# Patient Record
Sex: Female | Born: 1985 | Race: Black or African American | Hispanic: No | Marital: Married | Smoking: Current every day smoker
Health system: Southern US, Community
[De-identification: ages and names within clinical notes are randomized; demographics above are authoritative.]

## PROBLEM LIST (undated history)

## (undated) ENCOUNTER — Inpatient Hospital Stay (HOSPITAL_COMMUNITY): Payer: Self-pay

## (undated) ENCOUNTER — Emergency Department (HOSPITAL_COMMUNITY): Discharge status: Absent without leave | Payer: BC Managed Care – PPO

## (undated) DIAGNOSIS — Z8619 Personal history of other infectious and parasitic diseases: Secondary | ICD-10-CM

## (undated) DIAGNOSIS — K219 Gastro-esophageal reflux disease without esophagitis: Secondary | ICD-10-CM

## (undated) DIAGNOSIS — Z87898 Personal history of other specified conditions: Secondary | ICD-10-CM

## (undated) DIAGNOSIS — R51 Headache: Secondary | ICD-10-CM

## (undated) DIAGNOSIS — J9801 Acute bronchospasm: Secondary | ICD-10-CM

## (undated) DIAGNOSIS — R002 Palpitations: Secondary | ICD-10-CM

## (undated) DIAGNOSIS — F439 Reaction to severe stress, unspecified: Secondary | ICD-10-CM

## (undated) DIAGNOSIS — B009 Herpesviral infection, unspecified: Secondary | ICD-10-CM

## (undated) DIAGNOSIS — M6208 Separation of muscle (nontraumatic), other site: Secondary | ICD-10-CM

## (undated) DIAGNOSIS — O479 False labor, unspecified: Secondary | ICD-10-CM

## (undated) DIAGNOSIS — Z9289 Personal history of other medical treatment: Secondary | ICD-10-CM

## (undated) DIAGNOSIS — IMO0002 Reserved for concepts with insufficient information to code with codable children: Secondary | ICD-10-CM

## (undated) DIAGNOSIS — O165 Unspecified maternal hypertension, complicating the puerperium: Secondary | ICD-10-CM

## (undated) DIAGNOSIS — G5601 Carpal tunnel syndrome, right upper limb: Secondary | ICD-10-CM

## (undated) DIAGNOSIS — I10 Essential (primary) hypertension: Secondary | ICD-10-CM

## (undated) DIAGNOSIS — N83201 Unspecified ovarian cyst, right side: Secondary | ICD-10-CM

## (undated) DIAGNOSIS — I519 Heart disease, unspecified: Secondary | ICD-10-CM

## (undated) DIAGNOSIS — F410 Panic disorder [episodic paroxysmal anxiety] without agoraphobia: Secondary | ICD-10-CM

## (undated) DIAGNOSIS — O26851 Spotting complicating pregnancy, first trimester: Secondary | ICD-10-CM

## (undated) DIAGNOSIS — Z87442 Personal history of urinary calculi: Secondary | ICD-10-CM

## (undated) DIAGNOSIS — R102 Pelvic and perineal pain: Secondary | ICD-10-CM

## (undated) DIAGNOSIS — O1490 Unspecified pre-eclampsia, unspecified trimester: Secondary | ICD-10-CM

## (undated) DIAGNOSIS — D649 Anemia, unspecified: Secondary | ICD-10-CM

## (undated) DIAGNOSIS — Z22330 Carrier of Group B streptococcus: Secondary | ICD-10-CM

## (undated) DIAGNOSIS — E785 Hyperlipidemia, unspecified: Secondary | ICD-10-CM

## (undated) HISTORY — DX: Unspecified pre-eclampsia, unspecified trimester: O14.90

## (undated) HISTORY — DX: Reserved for concepts with insufficient information to code with codable children: IMO0002

## (undated) HISTORY — DX: Palpitations: R00.2

## (undated) HISTORY — DX: Spotting complicating pregnancy, first trimester: O26.851

## (undated) HISTORY — PX: OTHER SURGICAL HISTORY: SHX169

## (undated) HISTORY — DX: Reaction to severe stress, unspecified: F43.9

## (undated) HISTORY — DX: Personal history of other specified conditions: Z87.898

## (undated) HISTORY — DX: Headache: R51

## (undated) HISTORY — DX: Unspecified maternal hypertension, complicating the puerperium: O16.5

## (undated) HISTORY — DX: Hyperlipidemia, unspecified: E78.5

## (undated) HISTORY — DX: Heart disease, unspecified: I51.9

## (undated) HISTORY — DX: Herpesviral infection, unspecified: B00.9

## (undated) HISTORY — DX: Gastro-esophageal reflux disease without esophagitis: K21.9

## (undated) HISTORY — DX: False labor, unspecified: O47.9

## (undated) HISTORY — DX: Unspecified ovarian cyst, right side: N83.201

## (undated) HISTORY — DX: Carrier of group B Streptococcus: Z22.330

## (undated) HISTORY — DX: Anemia, unspecified: D64.9

## (undated) HISTORY — DX: Carpal tunnel syndrome, right upper limb: G56.01

## (undated) HISTORY — DX: Separation of muscle (nontraumatic), other site: M62.08

## (undated) HISTORY — DX: Pelvic and perineal pain: R10.2

## (undated) HISTORY — DX: Essential (primary) hypertension: I10

## (undated) HISTORY — DX: Personal history of other infectious and parasitic diseases: Z86.19

## (undated) HISTORY — PX: WISDOM TOOTH EXTRACTION: SHX21

## (undated) HISTORY — DX: Personal history of other medical treatment: Z92.89

---
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
## 2003-06-16 HISTORY — PX: OTHER SURGICAL HISTORY: SHX169

## 2003-08-14 DIAGNOSIS — R87619 Unspecified abnormal cytological findings in specimens from cervix uteri: Secondary | ICD-10-CM

## 2003-08-14 DIAGNOSIS — IMO0002 Reserved for concepts with insufficient information to code with codable children: Secondary | ICD-10-CM

## 2003-08-14 HISTORY — DX: Unspecified abnormal cytological findings in specimens from cervix uteri: R87.619

## 2003-08-14 HISTORY — DX: Reserved for concepts with insufficient information to code with codable children: IMO0002

## 2005-06-15 DIAGNOSIS — O4700 False labor before 37 completed weeks of gestation, unspecified trimester: Secondary | ICD-10-CM

## 2005-06-15 HISTORY — DX: False labor before 37 completed weeks of gestation, unspecified trimester: O47.00

## 2005-12-02 ENCOUNTER — Inpatient Hospital Stay (HOSPITAL_COMMUNITY): Admission: AD | Admit: 2005-12-02 | Discharge: 2005-12-03 | Payer: Self-pay | Admitting: Obstetrics and Gynecology

## 2005-12-03 ENCOUNTER — Inpatient Hospital Stay (HOSPITAL_COMMUNITY): Admission: AD | Admit: 2005-12-03 | Discharge: 2005-12-03 | Payer: Self-pay | Admitting: Obstetrics and Gynecology

## 2006-01-06 ENCOUNTER — Inpatient Hospital Stay (HOSPITAL_COMMUNITY): Admission: RE | Admit: 2006-01-06 | Discharge: 2006-01-08 | Payer: Self-pay | Admitting: Obstetrics and Gynecology

## 2006-08-11 ENCOUNTER — Emergency Department (HOSPITAL_COMMUNITY): Admission: EM | Admit: 2006-08-11 | Discharge: 2006-08-12 | Payer: Self-pay | Admitting: Emergency Medicine

## 2006-09-21 ENCOUNTER — Emergency Department (HOSPITAL_COMMUNITY): Admission: EM | Admit: 2006-09-21 | Discharge: 2006-09-22 | Payer: Self-pay | Admitting: Emergency Medicine

## 2007-01-21 ENCOUNTER — Emergency Department (HOSPITAL_COMMUNITY): Admission: EM | Admit: 2007-01-21 | Discharge: 2007-01-21 | Payer: Self-pay | Admitting: Emergency Medicine

## 2007-03-09 ENCOUNTER — Other Ambulatory Visit: Admission: RE | Admit: 2007-03-09 | Discharge: 2007-03-09 | Payer: Self-pay | Admitting: Gynecology

## 2007-06-16 HISTORY — PX: DILATION AND CURETTAGE OF UTERUS: SHX78

## 2007-09-22 ENCOUNTER — Inpatient Hospital Stay (HOSPITAL_COMMUNITY): Admission: AD | Admit: 2007-09-22 | Discharge: 2007-09-22 | Payer: Self-pay | Admitting: Obstetrics and Gynecology

## 2007-10-27 ENCOUNTER — Inpatient Hospital Stay (HOSPITAL_COMMUNITY): Admission: AD | Admit: 2007-10-27 | Discharge: 2007-10-27 | Payer: Self-pay | Admitting: Obstetrics and Gynecology

## 2007-10-30 ENCOUNTER — Ambulatory Visit (HOSPITAL_COMMUNITY): Admission: AD | Admit: 2007-10-30 | Discharge: 2007-10-31 | Payer: Self-pay | Admitting: Obstetrics and Gynecology

## 2007-10-31 ENCOUNTER — Encounter (INDEPENDENT_AMBULATORY_CARE_PROVIDER_SITE_OTHER): Payer: Self-pay | Admitting: Obstetrics and Gynecology

## 2008-08-21 IMAGING — US US OB TRANSVAGINAL
1 series · 14 of 23 positions shown · non-contrast
Comparison: None

CLINICAL DATA: Pregnancy; ;

OBSTETRIC <14 WK US  TRANSVAGINAL
TECHNIQUE: Transvaginal ultrasound examination were performed for
complete evaluation of the gestation as well as the maternal
uterus, adnexal regions, and pelvic cul-de-sac.

[Series 1: us ob transvaginal · 0.16mm/px · 14 of 23 slices shown]
[im 1/23]
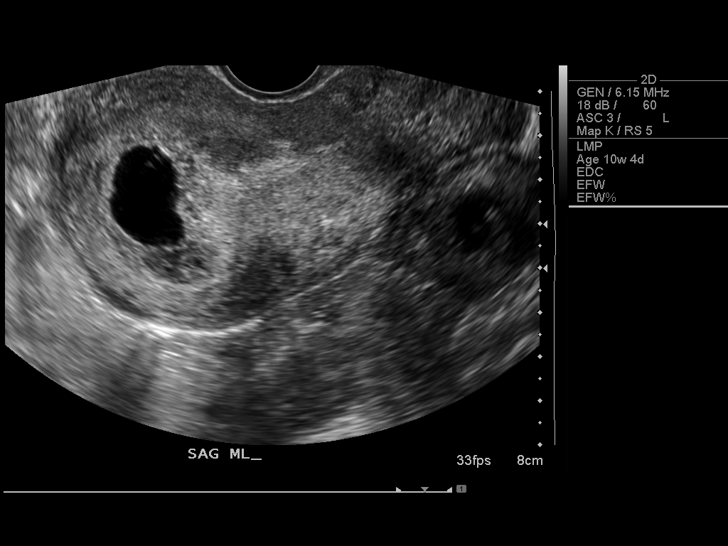
[im 3/23]
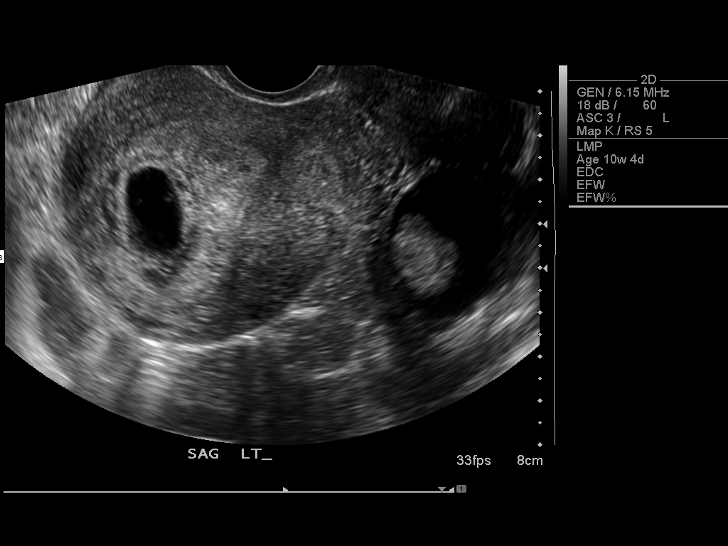
[im 5/23]
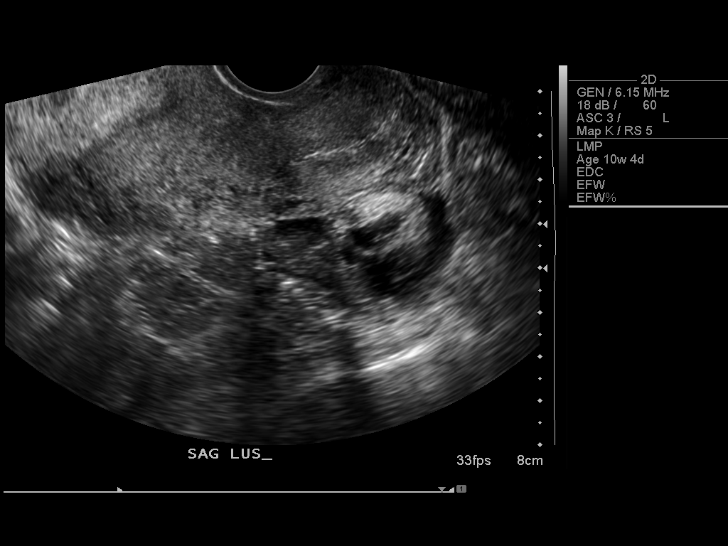
[im 6/23]
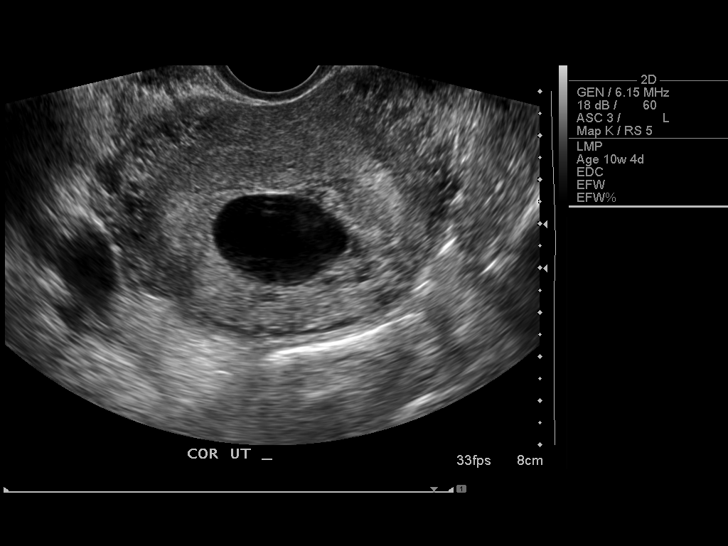
[im 8/23]
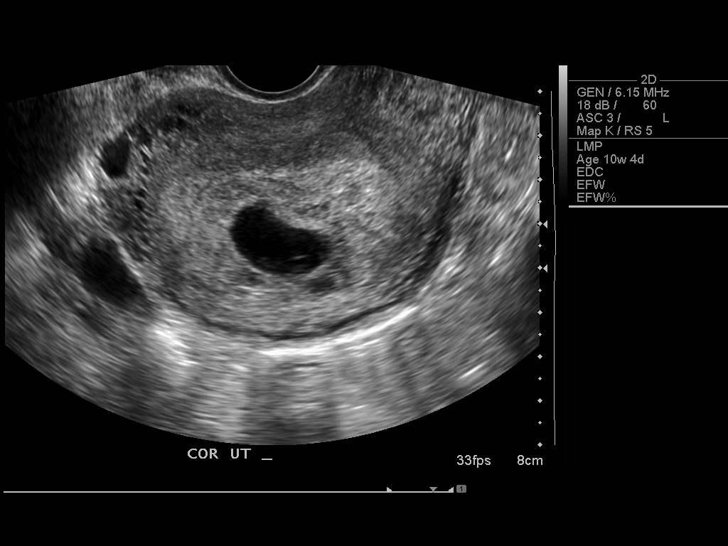
[im 10/23]
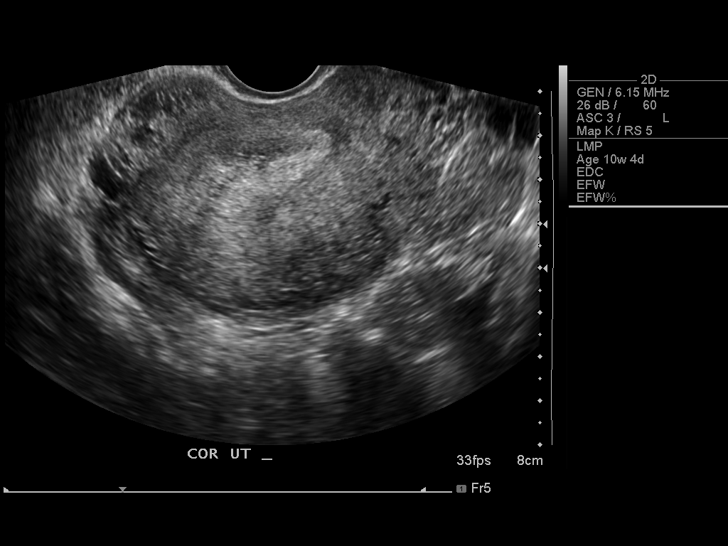
[im 11/23]
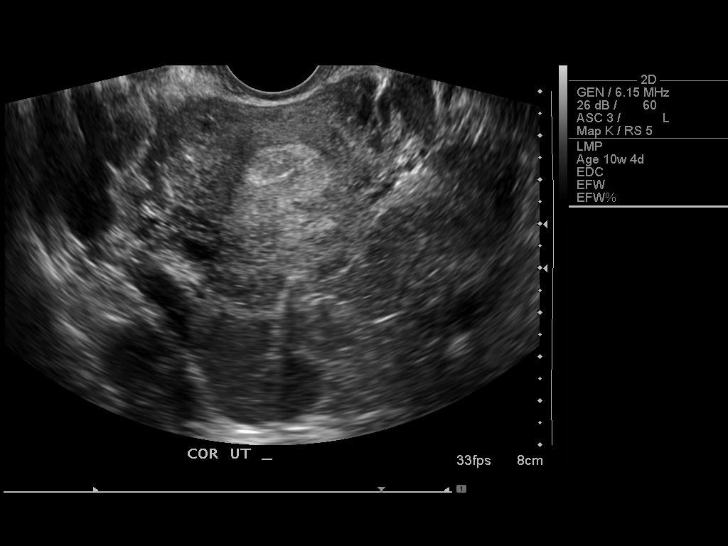
[im 13/23]
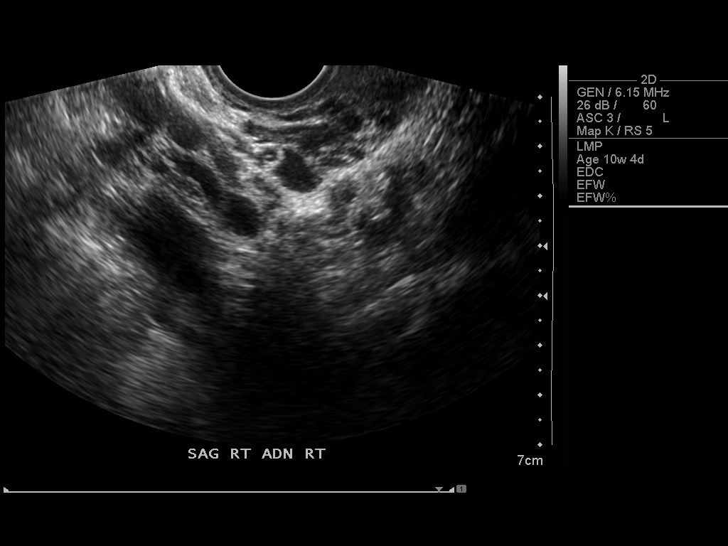
[im 14/23]
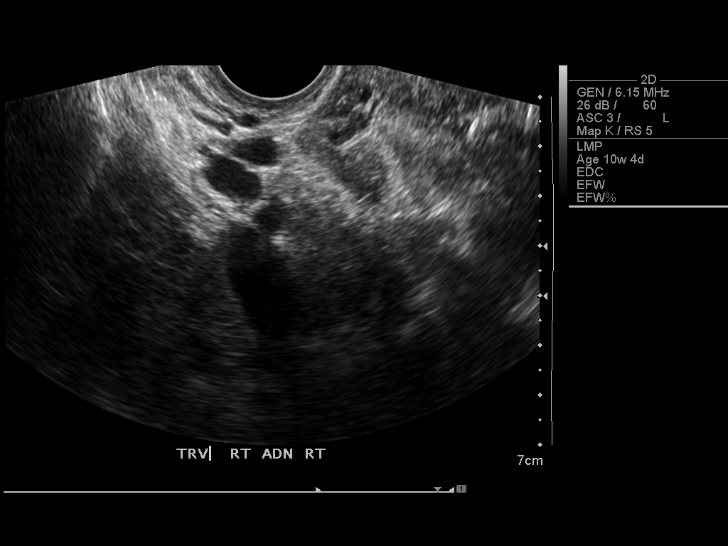
[im 16/23]
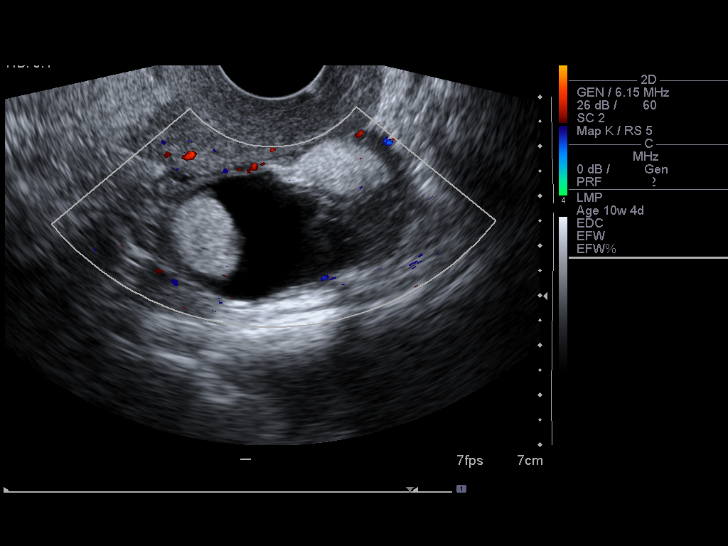
[im 18/23]
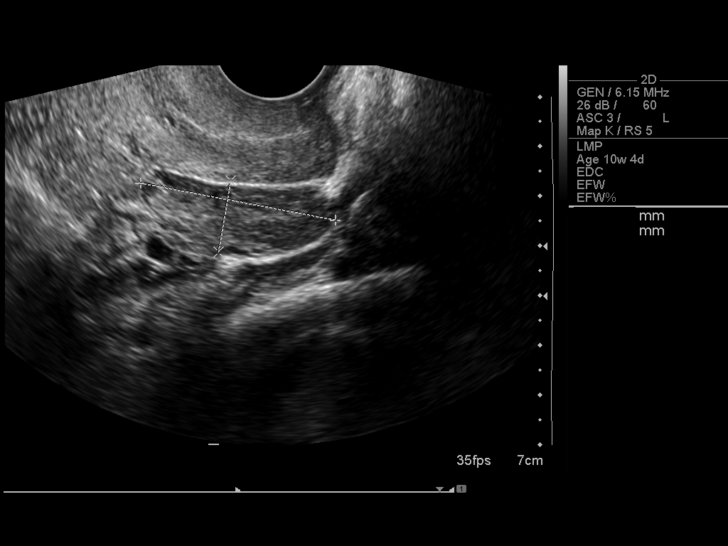
[im 19/23]
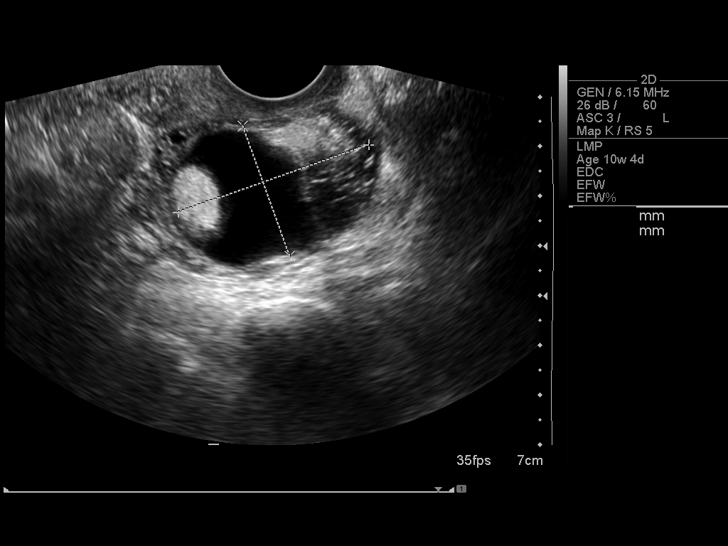
[im 21/23]
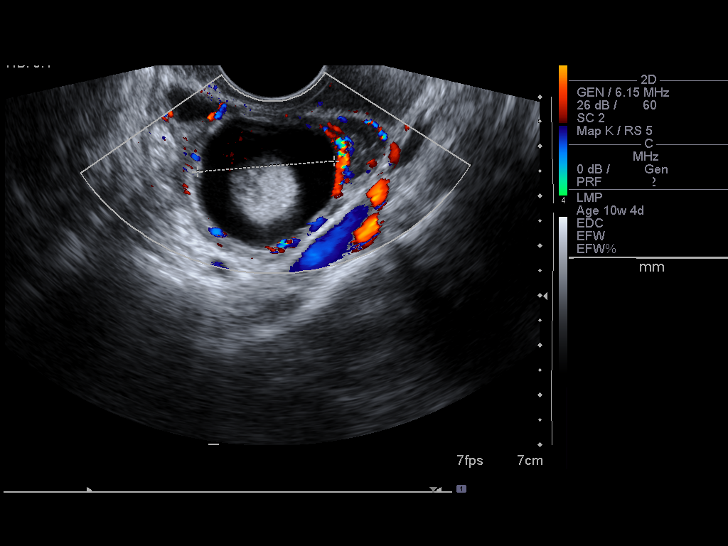
[im 23/23]
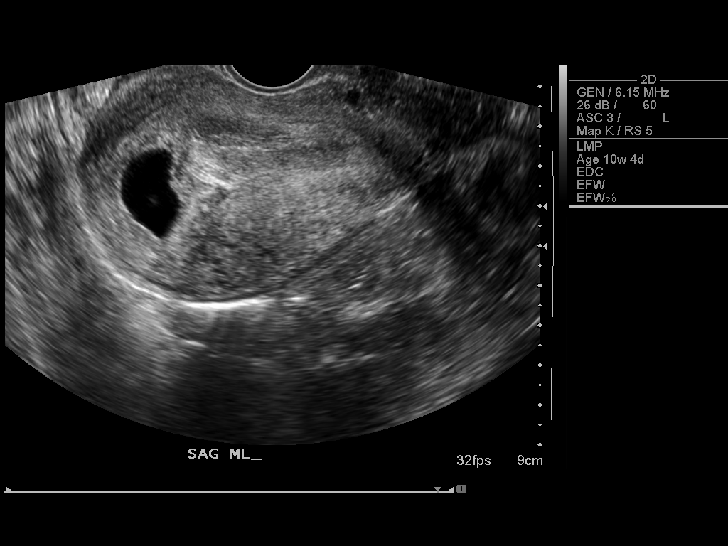

[14 of 23 positions shown; findings below may reference images not displayed]

FINDINGS: There is a single early intrauterine pregnancy.  Mean sac
diameter is 21.4 mm for estimated gestational age of 7 weeks 6
days.  No yolk sac or embryo identified.  No subchorionic
hemorrhage.

The patient is status post right ovary resection.  4 cm cyst in the
left ovary with a complex mixed , predominately echogenic center.
This raises the possibility for possible dermoid.
IMPRESSION: Early intrauterine gestation.  Based on mean sac diameter,
estimated gestational age is 7 weeks 6 days.  No yolk sac or fetal
pole identified although these would be expected to be seen given
the gestational sac diameter.  This is concerning for blighted
ovum.  Follow-up with serial beta HCGs and ultrasounds can be
performed to confirm this.

Mixed cystic solid area within the left ovary.  Some of the solid
components have high echogenicity.  Question dermoid.

## 2008-09-27 ENCOUNTER — Inpatient Hospital Stay (HOSPITAL_COMMUNITY): Admission: AD | Admit: 2008-09-27 | Discharge: 2008-09-27 | Payer: Self-pay | Admitting: Obstetrics and Gynecology

## 2008-10-22 ENCOUNTER — Inpatient Hospital Stay (HOSPITAL_COMMUNITY): Admission: RE | Admit: 2008-10-22 | Discharge: 2008-10-24 | Payer: Self-pay | Admitting: Obstetrics and Gynecology

## 2008-10-28 ENCOUNTER — Inpatient Hospital Stay (HOSPITAL_COMMUNITY): Admission: AD | Admit: 2008-10-28 | Discharge: 2008-10-28 | Payer: Self-pay | Admitting: Obstetrics and Gynecology

## 2008-10-29 ENCOUNTER — Ambulatory Visit (HOSPITAL_COMMUNITY): Admission: RE | Admit: 2008-10-29 | Discharge: 2008-10-29 | Payer: Self-pay | Admitting: Obstetrics and Gynecology

## 2008-11-01 ENCOUNTER — Encounter: Admission: RE | Admit: 2008-11-01 | Discharge: 2008-11-01 | Payer: Self-pay | Admitting: Obstetrics and Gynecology

## 2008-12-12 DIAGNOSIS — M6208 Separation of muscle (nontraumatic), other site: Secondary | ICD-10-CM | POA: Insufficient documentation

## 2008-12-12 HISTORY — DX: Separation of muscle (nontraumatic), other site: M62.08

## 2009-02-14 ENCOUNTER — Emergency Department (HOSPITAL_COMMUNITY): Admission: EM | Admit: 2009-02-14 | Discharge: 2009-02-14 | Payer: Self-pay | Admitting: Emergency Medicine

## 2009-08-24 IMAGING — US US ABDOMEN COMPLETE
1 series · 13 of 25 positions shown · non-contrast
Comparison: None

CLINICAL DATA: Abdominal pain. Postpartum, recent delivery on
10/21/2008.

ABDOMINAL ULTRASOUND COMPLETE

[Series 1: us abdomen complete · 0.17mm/px · 13 of 104 slices shown]
[im 1/104]
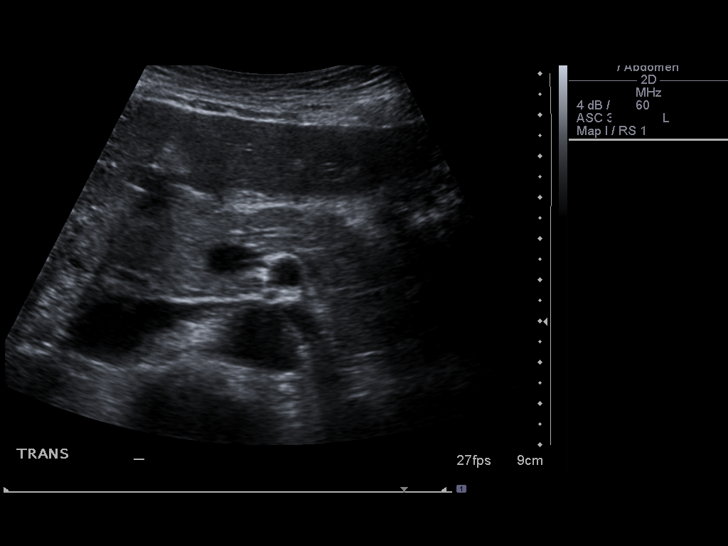
[im 9/104]
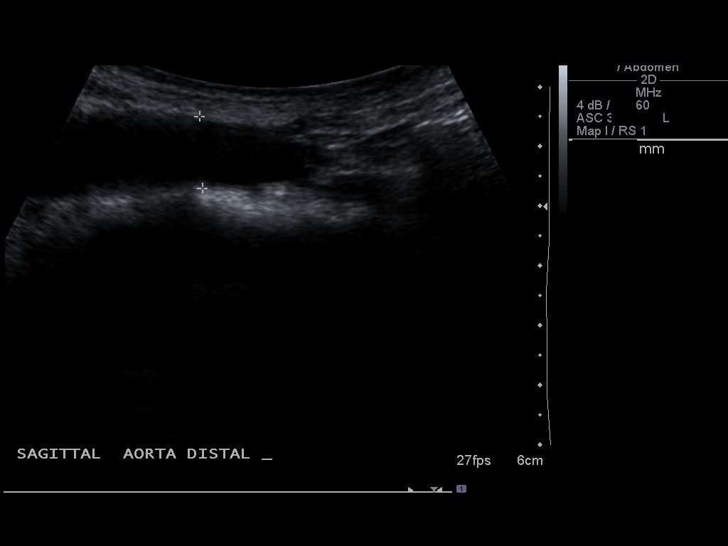
[im 18/104]
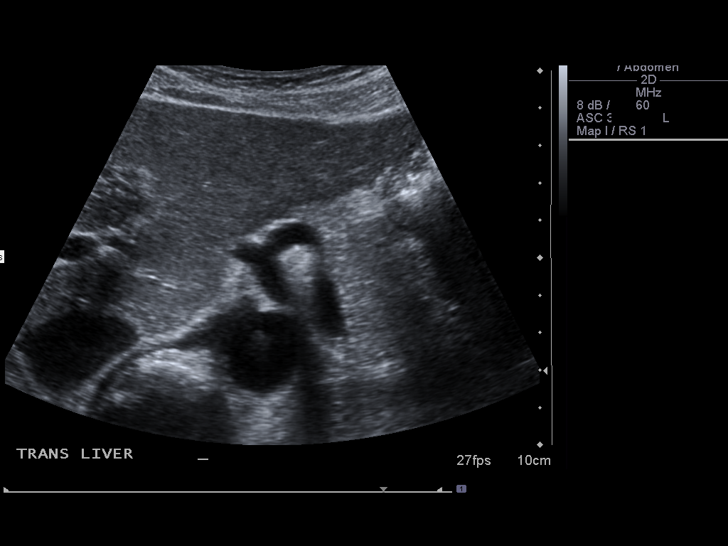
[im 26/104]
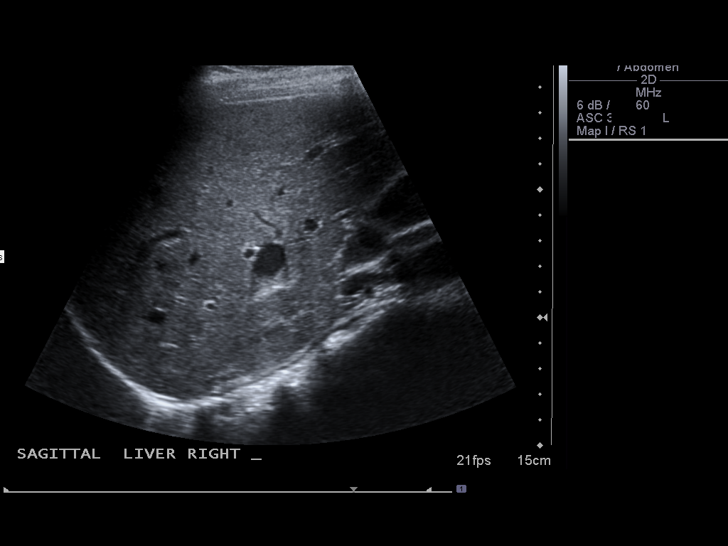
[im 35/104]
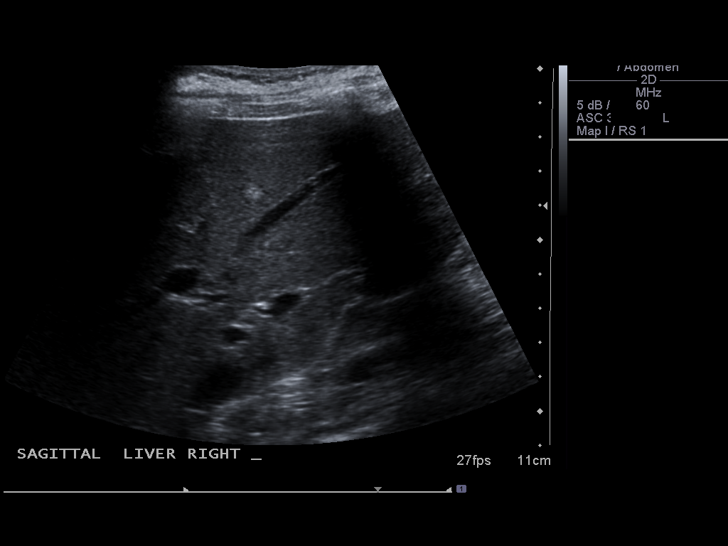
[im 43/104]
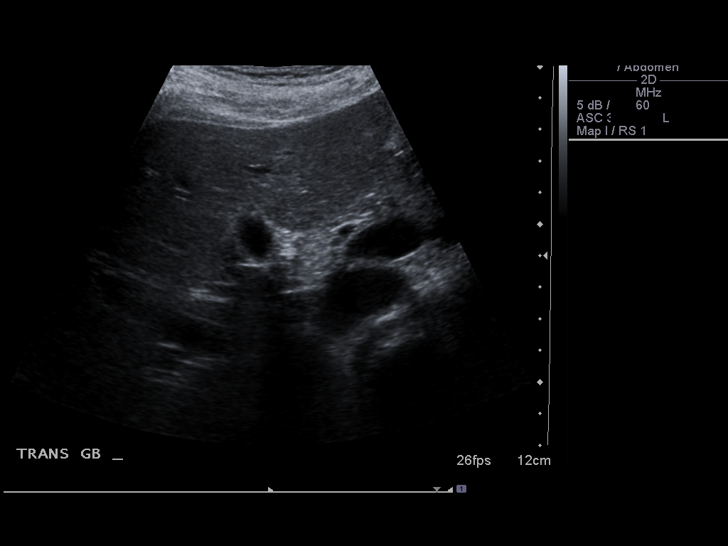
[im 52/104]
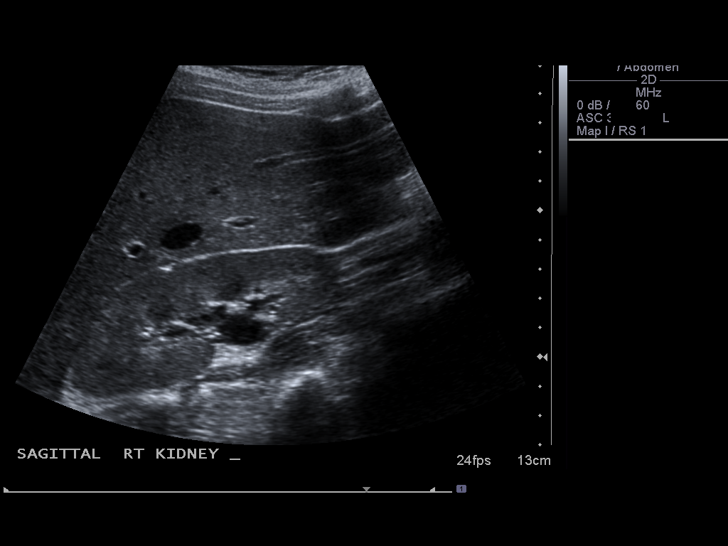
[im 61/104]
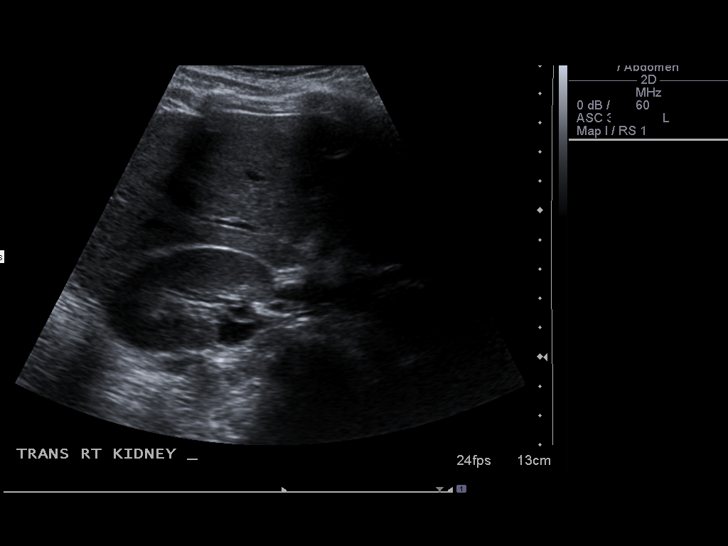
[im 69/104]
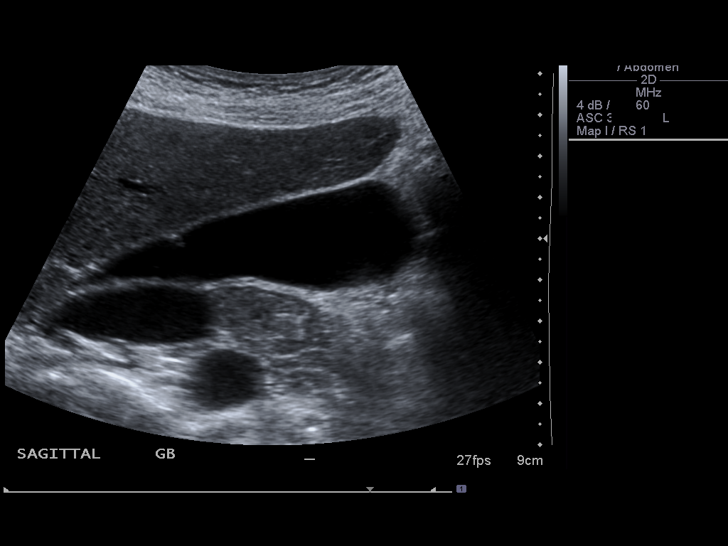
[im 78/104]
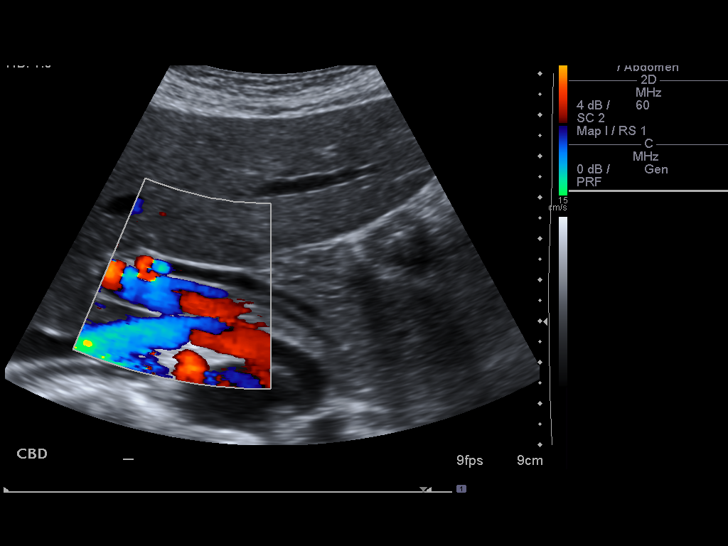
[im 86/104]
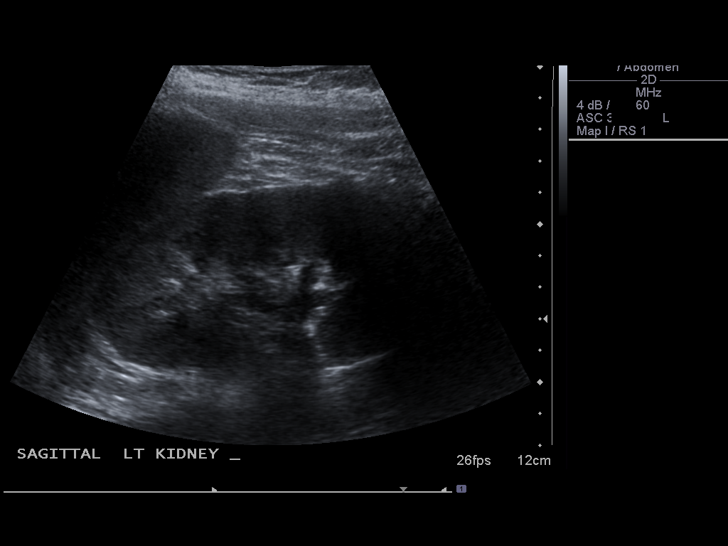
[im 95/104]
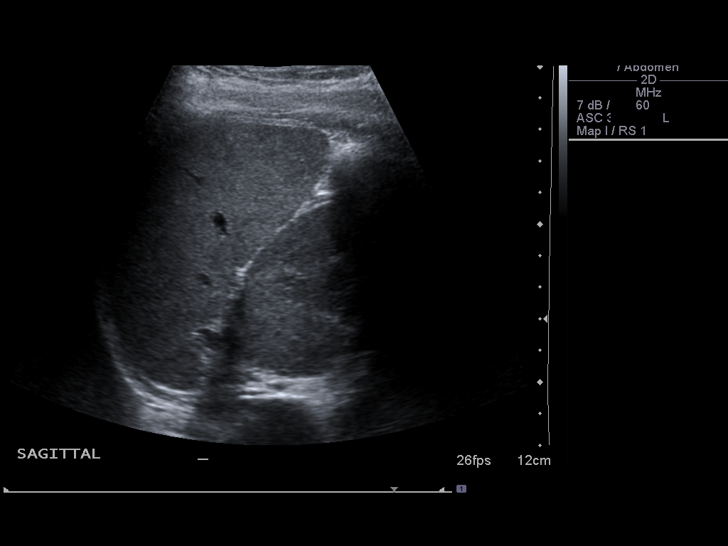
[im 104/104]
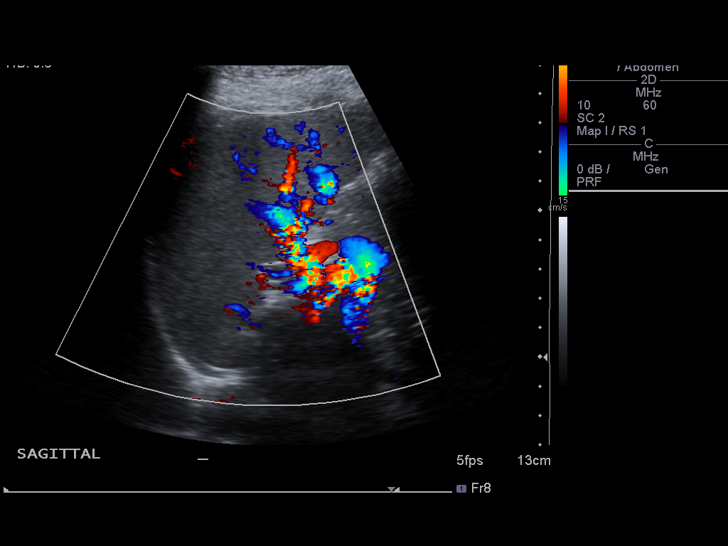

[13 of 25 positions shown; findings below may reference images not displayed]

FINDINGS: Gallbladder:  No gallstones, gallbladder wall thickening, or
pericholecystic fluid. 

Common bile duct: Within normal limits in caliber. The CBD measures
4 mm.

Liver:  A 7 mm homogeneously hyperechoic lesion near the junctions
of the right and left hepatic lobes has some posterior acoustic
enhancement and almost certainly represents a small hemangioma.
Within normal limits in parenchymal echogenicity.

Inferior vena cava:  Intrahepatic portion appears normal.

Pancreas:  Although entire pancreas not visualized due to bowel
gas, no abnormality identified.

Spleen:  Within normal limits in size and echogenicity.

Right kidney:  Normal in size and parenchymal echogenicity.  No
evidence of mass or hydronephrosis.

Left kidney:  Normal in size and parenchymal echogenicity.   No
evidence of mass or hydronephrosis.

Abdominal aorta:  No aneurysm identified.

A tiny amount of free fluid is identified along the superior aspect
of the liver and spleen.
IMPRESSION: Tiny amount of ascites along the superior aspect of the liver and
spleen.

A 7 mm hepatic lesion - almost certainly represents a hemangioma.
If the patient has a history of primary neoplasm, consider MRI for
further evaluation.

No other significant abnormalities identified.

## 2010-03-08 ENCOUNTER — Inpatient Hospital Stay (HOSPITAL_COMMUNITY): Admission: AD | Admit: 2010-03-08 | Discharge: 2010-03-10 | Payer: Self-pay | Admitting: Obstetrics and Gynecology

## 2010-03-14 ENCOUNTER — Inpatient Hospital Stay (HOSPITAL_COMMUNITY): Admission: AD | Admit: 2010-03-14 | Discharge: 2010-03-15 | Payer: Self-pay | Admitting: Obstetrics and Gynecology

## 2010-05-05 ENCOUNTER — Inpatient Hospital Stay (HOSPITAL_COMMUNITY)
Admission: AD | Admit: 2010-05-05 | Discharge: 2010-05-05 | Payer: Self-pay | Source: Home / Self Care | Admitting: Obstetrics and Gynecology

## 2010-07-08 ENCOUNTER — Emergency Department (HOSPITAL_COMMUNITY)
Admission: EM | Admit: 2010-07-08 | Discharge: 2010-07-08 | Payer: Self-pay | Source: Home / Self Care | Admitting: Emergency Medicine

## 2010-08-26 LAB — CBC
HCT: 37.5 % (ref 36.0–46.0)
Hemoglobin: 12.3 g/dL (ref 12.0–15.0)
MCH: 25.7 pg — ABNORMAL LOW (ref 26.0–34.0)
MCHC: 32.7 g/dL (ref 30.0–36.0)
MCV: 78.7 fL (ref 78.0–100.0)
Platelets: 210 K/uL (ref 150–400)
RBC: 4.77 MIL/uL (ref 3.87–5.11)
RDW: 14.2 % (ref 11.5–15.5)
WBC: 6.3 K/uL (ref 4.0–10.5)

## 2010-08-26 LAB — LACTATE DEHYDROGENASE: LDH: 126 U/L (ref 94–250)

## 2010-08-26 LAB — COMPREHENSIVE METABOLIC PANEL WITH GFR
ALT: 10 U/L (ref 0–35)
AST: 19 U/L (ref 0–37)
Albumin: 4.2 g/dL (ref 3.5–5.2)
Alkaline Phosphatase: 65 U/L (ref 39–117)
BUN: 14 mg/dL (ref 6–23)
CO2: 26 meq/L (ref 19–32)
Calcium: 9.3 mg/dL (ref 8.4–10.5)
Chloride: 107 meq/L (ref 96–112)
Creatinine, Ser: 0.8 mg/dL (ref 0.4–1.2)
GFR calc Af Amer: >60 mL/min (ref >60)
GFR calc non Af Amer: >60 mL/min (ref >60)
Glucose, Bld: 84 mg/dL (ref 70–99)
Potassium: 3.9 meq/L (ref 3.5–5.1)
Sodium: 140 meq/L (ref 135–145)
Total Bilirubin: 0.6 mg/dL (ref 0.3–1.2)
Total Protein: 7.1 g/dL (ref 6.0–8.3)

## 2010-08-26 LAB — URIC ACID: Uric Acid, Serum: 5.4 mg/dL (ref 2.4–7.0)

## 2010-08-28 LAB — CBC
HCT: 26.3 % — ABNORMAL LOW (ref 36.0–46.0)
HCT: 30.4 % — ABNORMAL LOW (ref 36.0–46.0)
HCT: 31 % — ABNORMAL LOW (ref 36.0–46.0)
Hemoglobin: 10.2 g/dL — ABNORMAL LOW (ref 12.0–15.0)
Hemoglobin: 10.3 g/dL — ABNORMAL LOW (ref 12.0–15.0)
MCH: 27 pg (ref 26.0–34.0)
MCHC: 33.1 g/dL (ref 30.0–36.0)
MCV: 81.2 fL (ref 78.0–100.0)
Platelets: 163 10*3/uL (ref 150–400)
Platelets: 266 10*3/uL (ref 150–400)
RDW: 14.4 % (ref 11.5–15.5)

## 2010-08-28 LAB — URINE MICROSCOPIC-ADD ON

## 2010-08-28 LAB — COMPREHENSIVE METABOLIC PANEL
ALT: 19 U/L (ref 0–35)
CO2: 22 mEq/L (ref 19–32)
Calcium: 8.9 mg/dL (ref 8.4–10.5)
Chloride: 105 mEq/L (ref 96–112)
GFR calc Af Amer: >60 mL/min (ref >60)
Total Bilirubin: 0.2 mg/dL — ABNORMAL LOW (ref 0.3–1.2)

## 2010-08-28 LAB — URINALYSIS, ROUTINE W REFLEX MICROSCOPIC
Bilirubin Urine: NEGATIVE
Ketones, ur: NEGATIVE mg/dL
Nitrite: NEGATIVE
Protein, ur: NEGATIVE mg/dL
Urobilinogen, UA: 0.2 mg/dL (ref 0.0–1.0)
pH: 6.5 (ref 5.0–8.0)

## 2010-08-28 LAB — RPR: RPR Ser Ql: NONREACTIVE

## 2010-08-28 LAB — LACTATE DEHYDROGENASE: LDH: 203 U/L (ref 94–250)

## 2010-09-19 LAB — POCT I-STAT, CHEM 8
BUN: 14 mg/dL (ref 6–23)
Calcium, Ion: 1.18 mmol/L (ref 1.12–1.32)
Chloride: 106 mEq/L (ref 96–112)
Glucose, Bld: 77 mg/dL (ref 70–99)
HCT: 41 % (ref 36.0–46.0)
Potassium: 3.8 mEq/L (ref 3.5–5.1)

## 2010-09-19 LAB — WET PREP, GENITAL
Clue Cells Wet Prep HPF POC: NONE SEEN
Trich, Wet Prep: NONE SEEN
Yeast Wet Prep HPF POC: NONE SEEN

## 2010-09-19 LAB — URINALYSIS, ROUTINE W REFLEX MICROSCOPIC
Specific Gravity, Urine: 1.024 (ref 1.005–1.030)
pH: 6 (ref 5.0–8.0)

## 2010-09-19 LAB — CBC
MCHC: 32.5 g/dL (ref 30.0–36.0)
MCV: 78.6 fL (ref 78.0–100.0)
Platelets: 234 10*3/uL (ref 150–400)
RBC: 4.97 MIL/uL (ref 3.87–5.11)
RDW: 13.8 % (ref 11.5–15.5)

## 2010-09-19 LAB — DIFFERENTIAL
Basophils Absolute: 0 10*3/uL (ref 0.0–0.1)
Basophils Relative: 0 % (ref 0–1)
Eosinophils Absolute: 0.1 10*3/uL (ref 0.0–0.7)
Neutro Abs: 5.2 10*3/uL (ref 1.7–7.7)
Neutrophils Relative %: 57 % (ref 43–77)

## 2010-09-19 LAB — URINE CULTURE: Colony Count: >=100000

## 2010-09-19 LAB — URINE MICROSCOPIC-ADD ON

## 2010-09-23 LAB — DIFFERENTIAL
Basophils Absolute: 0 10*3/uL (ref 0.0–0.1)
Eosinophils Absolute: 0.1 10*3/uL (ref 0.0–0.7)
Eosinophils Relative: 1 % (ref 0–5)
Lymphocytes Relative: 25 % (ref 12–46)
Lymphs Abs: 2.4 10*3/uL (ref 0.7–4.0)
Monocytes Absolute: 0.5 10*3/uL (ref 0.1–1.0)

## 2010-09-23 LAB — CBC
HCT: 31.3 % — ABNORMAL LOW (ref 36.0–46.0)
HCT: 25.3 % — ABNORMAL LOW (ref 36.0–46.0)
HCT: 30.3 % — ABNORMAL LOW (ref 36.0–46.0)
Hemoglobin: 10.8 g/dL — ABNORMAL LOW (ref 12.0–15.0)
Hemoglobin: 10.4 g/dL — ABNORMAL LOW (ref 12.0–15.0)
MCHC: 34.2 g/dL (ref 30.0–36.0)
MCHC: 34.4 g/dL (ref 30.0–36.0)
MCV: 82.7 fL (ref 78.0–100.0)
MCV: 82.3 fL (ref 78.0–100.0)
MCV: 82.4 fL (ref 78.0–100.0)
Platelets: 221 10*3/uL (ref 150–400)
Platelets: 159 10*3/uL (ref 150–400)
RBC: 3.68 MIL/uL — ABNORMAL LOW (ref 3.87–5.11)
RDW: 13.7 % (ref 11.5–15.5)
WBC: 13.1 10*3/uL — ABNORMAL HIGH (ref 4.0–10.5)

## 2010-09-23 LAB — COMPREHENSIVE METABOLIC PANEL
ALT: 15 U/L (ref 0–35)
AST: 20 U/L (ref 0–37)
Albumin: 3.2 g/dL — ABNORMAL LOW (ref 3.5–5.2)
Alkaline Phosphatase: 82 U/L (ref 39–117)
CO2: 27 mEq/L (ref 19–32)
Chloride: 107 mEq/L (ref 96–112)
GFR calc Af Amer: >60 mL/min (ref >60)
GFR calc non Af Amer: >60 mL/min (ref >60)
Potassium: 3.8 mEq/L (ref 3.5–5.1)
Total Bilirubin: 0.4 mg/dL (ref 0.3–1.2)

## 2010-09-23 LAB — AMYLASE: Amylase: 85 U/L (ref 27–131)

## 2010-09-23 LAB — URINALYSIS, ROUTINE W REFLEX MICROSCOPIC
Bilirubin Urine: NEGATIVE
Glucose, UA: NEGATIVE mg/dL
Ketones, ur: NEGATIVE mg/dL
Protein, ur: NEGATIVE mg/dL
pH: 8 (ref 5.0–8.0)

## 2010-09-23 LAB — URINE MICROSCOPIC-ADD ON

## 2010-09-24 LAB — COMPREHENSIVE METABOLIC PANEL
Albumin: 2.9 g/dL — ABNORMAL LOW (ref 3.5–5.2)
Alkaline Phosphatase: 76 U/L (ref 39–117)
BUN: 5 mg/dL — ABNORMAL LOW (ref 6–23)
Creatinine, Ser: 0.44 mg/dL (ref 0.4–1.2)
GFR calc Af Amer: >60 mL/min (ref >60)
Potassium: 3.6 mEq/L (ref 3.5–5.1)
Total Protein: 6 g/dL (ref 6.0–8.3)

## 2010-09-24 LAB — URINE MICROSCOPIC-ADD ON

## 2010-09-24 LAB — URINALYSIS, ROUTINE W REFLEX MICROSCOPIC
Glucose, UA: NEGATIVE mg/dL
Hgb urine dipstick: NEGATIVE
Specific Gravity, Urine: 1.01 (ref 1.005–1.030)

## 2010-09-24 LAB — CBC
HCT: 31.2 % — ABNORMAL LOW (ref 36.0–46.0)
Platelets: 150 10*3/uL (ref 150–400)
RDW: 13.5 % (ref 11.5–15.5)

## 2010-10-05 ENCOUNTER — Ambulatory Visit (INDEPENDENT_AMBULATORY_CARE_PROVIDER_SITE_OTHER): Payer: Medicaid Other

## 2010-10-05 ENCOUNTER — Inpatient Hospital Stay (INDEPENDENT_AMBULATORY_CARE_PROVIDER_SITE_OTHER)
Admission: RE | Admit: 2010-10-05 | Discharge: 2010-10-05 | Disposition: A | Discharge status: Home / Self Care | Payer: Medicaid Other | Source: Ambulatory Visit | Attending: Emergency Medicine | Admitting: Emergency Medicine

## 2010-10-05 ENCOUNTER — Emergency Department (HOSPITAL_COMMUNITY)
Admission: EM | Admit: 2010-10-05 | Discharge: 2010-10-06 | Disposition: A | Discharge status: Home / Self Care | Payer: Medicaid Other | Attending: Emergency Medicine | Admitting: Emergency Medicine

## 2010-10-05 DIAGNOSIS — N390 Urinary tract infection, site not specified: Secondary | ICD-10-CM | POA: Insufficient documentation

## 2010-10-05 DIAGNOSIS — R319 Hematuria, unspecified: Secondary | ICD-10-CM

## 2010-10-05 DIAGNOSIS — R109 Unspecified abdominal pain: Secondary | ICD-10-CM

## 2010-10-05 DIAGNOSIS — Z87442 Personal history of urinary calculi: Secondary | ICD-10-CM | POA: Insufficient documentation

## 2010-10-05 LAB — POCT URINALYSIS DIP (DEVICE)
Bilirubin Urine: NEGATIVE
Ketones, ur: NEGATIVE mg/dL
Specific Gravity, Urine: 1.015 (ref 1.005–1.030)
pH: 7 (ref 5.0–8.0)

## 2010-10-05 LAB — CBC
HCT: 35.5 % — ABNORMAL LOW (ref 36.0–46.0)
Hemoglobin: 11.2 g/dL — ABNORMAL LOW (ref 12.0–15.0)
MCV: 78.9 fL (ref 78.0–100.0)
Platelets: 198 10*3/uL (ref 150–400)
RBC: 4.5 MIL/uL (ref 3.87–5.11)
WBC: 11.4 10*3/uL — ABNORMAL HIGH (ref 4.0–10.5)

## 2010-10-05 LAB — DIFFERENTIAL
Lymphocytes Relative: 10 % — ABNORMAL LOW (ref 12–46)
Lymphs Abs: 1.2 10*3/uL (ref 0.7–4.0)
Neutrophils Relative %: 80 % — ABNORMAL HIGH (ref 43–77)

## 2010-10-05 LAB — POCT PREGNANCY, URINE: Preg Test, Ur: NEGATIVE

## 2010-10-06 LAB — URINE MICROSCOPIC-ADD ON

## 2010-10-06 LAB — URINALYSIS, ROUTINE W REFLEX MICROSCOPIC
Bilirubin Urine: NEGATIVE
Glucose, UA: NEGATIVE mg/dL
Nitrite: NEGATIVE
Specific Gravity, Urine: 1.024 (ref 1.005–1.030)
pH: 5.5 (ref 5.0–8.0)

## 2010-10-06 LAB — BASIC METABOLIC PANEL
CO2: 26 mEq/L (ref 19–32)
Calcium: 9.1 mg/dL (ref 8.4–10.5)
Creatinine, Ser: 0.92 mg/dL (ref 0.4–1.2)
GFR calc Af Amer: >60 mL/min (ref >60)

## 2010-10-07 LAB — URINE CULTURE: Culture  Setup Time: 201204231104
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
## 2010-10-28 NOTE — Op Note (Signed)
[name], [name]                ACCOUNT NO.:  [id number]   MEDICAL RECORD NO.:  [id number]          PATIENT TYPE:  AMB   LOCATION:  SDC                           [facility]   PHYSICIAN:  Naima A. Dillard, M.D. DATE OF BIRTH:  [birth year]   DATE OF PROCEDURE:  10/31/2007  DATE OF DISCHARGE:                               OPERATIVE REPORT   PREOPERATIVE DIAGNOSIS:  Incomplete abortion in the first trimester.   POSTOPERATIVE DIAGNOSIS:  Incomplete abortion in the first trimester.   PROCEDURE:  Dilatation and evacuation.   SURGEON:  Naima A. Dillard, M.D.   ASSISTANT:  None.   ANESTHESIA:  MAC and local.   FINDINGS:  Eight weeks' size uterus with heavy vaginal bleeding.   SPECIMENS:  Products of conception sent to pathology.   ESTIMATED BLOOD LOSS:  About 500 mL from the time, the patient was  diagnosed to the time the patient gone to the OR and in the OR, it was  only about 100 mL loss.   COMPLICATIONS:  None.  The patient went to recovery room in stable  condition.   PROCEDURE IN DETAIL:  The patient was taken to the operating room, where  she was given MAC anesthesia, placed in dorsal lithotomy position, and  prepped and draped in a normal sterile fashion.  A bivalve speculum was  placed into the vagina.  The anterior lip of the cervix was grasped with  single-tooth tenaculum.  The cervix was already felt to be dilated in 9  weeks' size.  A size 8 suction curettage was used until a gritty texture  was noted, then a sharp curette was noted to have a gritty texture in  all four walls.  The fascia curettage and was then replaced back into  the uterus to remove any excess blood.  The tenaculum was removed  without difficulty.  A 20 mL of 1% lidocaine was used for cervical block  before the procedure was started.  All sponge, lap, and needle counts  were correct.  All instruments removed from the vagina without  difficulty.  Tenaculum was removed with good hemostasis.  The  patient  tolerated the procedure well.  The bladder was catheterized before the  surgery was began.  The patient agreed to [facility].  She understands the risks to be, but not  limited to bleeding, infection, damage to internal organs such as bowel,  bladder, major blood vessel, or perforation of the uterus and Asherman  syndrome, which could lead to infertility.  The patient agreed to  proceed because of her heavy vaginal bleeding.      Naima A. Normand Sloop, M.D.  Electronically Signed     NAD/MEDQ  D:  10/31/2007  T:  10/31/2007  Job:  [id number]

## 2010-10-28 NOTE — H&P (Signed)
[name], [name]                ACCOUNT NO.:  [id number]   MEDICAL RECORD NO.:  [id number]          PATIENT TYPE:  AMB   LOCATION:  SDC                           [facility]   PHYSICIAN:  Amber A. Dillard, M.D. DATE OF BIRTH:  1985/09/22   DATE OF ADMISSION:  10/30/2007  DATE OF DISCHARGE:                              HISTORY & PHYSICAL   The patient is 25 year old, single, black female, gravida 3, para 2-0-0-  2 presents with chief complaint of severe lower abdominal pain worsening  around 9 p.m. last night and increased vaginal bleeding since about the  same time as well.  She complains of pain radiating down her right lower  extremity, complained of dizziness this p.m. and some faintness since  arrival.  The patient seen last Thursday evening Oct 27, 2007 with  complaint of vaginal bleeding.  Reports viable ultrasound on April 22 in  our office.  She reports last menstrual period giving her an [facility] of  November 29, 11/29 but [facility] after ultrasound on April 22 was May 25, 2008.  Per that [facility] she is today 10-2/[redacted] weeks gestation.  Ultrasound on  May 14 showed gestational sac but no embryo or yolk sac.  Ultrasound  also showed a 4 cm left complex cyst, probable dermoid.  The patient has  a 60-year-old and 11-month-old daughters.   ALLERGIES:  She has no known drug allergies.   MEDICATIONS:  She currently takes a prenatal vitamin and Tylenol p.r.n.,  Aleve this evening and Unisom.   MEDICAL HISTORY:  1. History of kidney stones.  2. Current left ovarian cyst, complex.  3. 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  4. History of abnormal Pap with normal colposcopy in 2005.   SURGICAL HISTORY:  1. Right breast lumpectomy 2005.  2. Right salpingo-oophorectomy in 2003.   SOCIAL HISTORY:  Negative EtOH, tobacco or illicit drug use.  Single,  unemployed.   FAMILY HISTORY:  Significant for mother is alcoholic, diabetic and  hypertensive.   OBJECTIVE:  VITAL SIGNS:  Since admission blood pressure  114/72 and  120/78, heart rate 76-80, temperature 98.2, respirations 20.  White  count 11.5, hemoglobin 12.1, down from 12.2 on May 14, hematocrit 36.2,  platelets of 230,000.  Differential with elevated absolute granulocytes  equal to 7.8.  Her quant today is 72 and on May 14, it was 12,920.  She has O positive blood type which was drawn May 14, and GC and  Chlamydia cultures were negative on May 14.   PHYSICAL EXAMINATION:  GENERAL:  Noted discomfort and distress with  contractions intermittent and grimacing.  She is alert and oriented x3.  CARDIOVASCULAR:  Regular rate and rhythm without murmur.  LUNGS:  Clear to auscultation bilaterally.  ABDOMEN:  Soft and nontender.  PELVIC:  Sterile speculum with large amount of bright red vaginal  bleeding in the vault and active at os.  She has saturated  3 to 4 pads  since arrival.  Cervix was 1 cm dilated.  EXTREMITIES:  Within normal limits.   She had an ultrasound today stating compared to recent prior  examination.  There is interval collapse of gestational sac now with  irregular margins and no yolk sac or fetal pole, consistent with  abortion __________.  Patient with bleeding most consistent with  abortion in progress, gestational sac measuring 2.5 x 1.1 x 1.4 cm.  Estimated gestational age of [redacted] weeks and 4 days which is smaller than  prior exam and consistent with abortion.   IMPRESSION:  1. Incomplete abortion with heavy vaginal bleeding and decrease in      blood pressure  67/44.  Pulse is currently 102.  2. She is Rh positive.  3. Dizziness and faintness.   PLAN:  1. After Dr. Redmond Baseman assessment at approximately 2 a.m. plan made to      proceed with dilatation and evacuation secondary to increased      vaginal bleeding and hemodynamic instability.  2. The patient received Toradol 30 mg IV x1 along with morphine      sulfate 2 mg IV x1.  She was consented for the dilatation and      evacuation and prepped for OR.  IV  fluids are running with LR with      20 units of Pitocin.      Candice Shubert, CNM      [name], M.D.  Electronically Signed    CHS/MEDQ  D:  10/31/2007  T:  10/31/2007  Job:  [id number]

## 2010-10-31 NOTE — H&P (Signed)
[name], [name]                ACCOUNT NO.:  [id number]   MEDICAL RECORD NO.:  [id number]          PATIENT TYPE:  INP   LOCATION:  9138                          [facility]   PHYSICIAN:  Lenoard Aden, M.D.DATE OF BIRTH:  02/05/1986   DATE OF ADMISSION:  01/06/2006  DATE OF DISCHARGE:                                HISTORY & PHYSICAL   CHIEF COMPLAINT:  History of husband deployed in the military for scheduled  delivery and advanced dilatation.   She is a 25 year old African-American female, G2, P1, preterm contractions  and preterm cervical change.  No other medical or surgical hospitalizations  outside pregnancy.  She has a history of a kidney stone.   She has a family history of anemia, diabetes, kidney disease, cervical  cancer, breast cancer, stroke, depression, alcohol abuse, and nicotine  dependence.   PHYSICAL EXAMINATION:  GENERAL: Well-developed, well-nourished African-  American female in no acute distress.  HEENT:  Normal.  LUNGS:  Clear.  HEART: Regular rate and rhythm.  ABDOMEN:  Soft, gravid, and nontender.  PELVIC: Cervix is 4-5 cm, 50%, Vertex, -2.  EXTREMITIES:  No cords.  NEUROLOGIC: Exam is nonfocal.   IMPRESSION:  1.  39-week intrauterine pregnancy.  2.  Social induction with favorable cervix.   PLAN:  Anticipated attempts at vaginal delivery.      Lenoard Aden, M.D.  Electronically Signed     RJT/MEDQ  D:  01/06/2006  T:  01/06/2006  Job:  [id number]

## 2011-03-10 LAB — CBC
Hemoglobin: 12.3
RBC: 4.6
WBC: 11.9 — ABNORMAL HIGH

## 2011-03-10 LAB — DIFFERENTIAL
Basophils Absolute: 0.1
Lymphocytes Relative: 27
Monocytes Absolute: 0.5
Monocytes Relative: 5
Neutro Abs: 8 — ABNORMAL HIGH

## 2011-03-10 LAB — URINALYSIS, ROUTINE W REFLEX MICROSCOPIC
Bilirubin Urine: NEGATIVE
Glucose, UA: NEGATIVE
Ketones, ur: 15 — AB
pH: 5.5

## 2011-03-10 LAB — GC/CHLAMYDIA PROBE AMP, GENITAL: Chlamydia, DNA Probe: NEGATIVE

## 2011-03-10 LAB — WET PREP, GENITAL
Clue Cells Wet Prep HPF POC: NONE SEEN
WBC, Wet Prep HPF POC: NONE SEEN
Yeast Wet Prep HPF POC: NONE SEEN

## 2011-03-10 LAB — URINE MICROSCOPIC-ADD ON

## 2011-03-11 LAB — CBC
HCT: 36.2
MCHC: 33.5
Platelets: 230
RDW: 13.3

## 2011-03-11 LAB — DIFFERENTIAL
Basophils Absolute: 0.1
Basophils Relative: 1
Eosinophils Relative: 1
Lymphocytes Relative: 25

## 2011-03-11 LAB — HCG, QUANTITATIVE, PREGNANCY: hCG, Beta Chain, Quant, S: 7941 — ABNORMAL HIGH

## 2011-05-17 DIAGNOSIS — Z0389 Encounter for observation for other suspected diseases and conditions ruled out: Secondary | ICD-10-CM | POA: Insufficient documentation

## 2011-05-18 ENCOUNTER — Emergency Department (HOSPITAL_COMMUNITY)
Admission: EM | Admit: 2011-05-18 | Discharge: 2011-05-18 | Discharge status: Against Medical Advice | Payer: Medicaid Other | Attending: Emergency Medicine | Admitting: Emergency Medicine

## 2011-06-12 ENCOUNTER — Other Ambulatory Visit (HOSPITAL_COMMUNITY): Payer: Self-pay | Admitting: Nurse Practitioner

## 2011-06-12 ENCOUNTER — Ambulatory Visit (HOSPITAL_COMMUNITY)
Admission: RE | Admit: 2011-06-12 | Discharge: 2011-06-12 | Disposition: A | Discharge status: Home / Self Care | Payer: Medicaid Other | Source: Ambulatory Visit | Attending: Nurse Practitioner | Admitting: Nurse Practitioner

## 2011-06-12 ENCOUNTER — Other Ambulatory Visit: Payer: Self-pay

## 2011-06-12 DIAGNOSIS — R079 Chest pain, unspecified: Secondary | ICD-10-CM | POA: Insufficient documentation

## 2011-06-12 DIAGNOSIS — M79609 Pain in unspecified limb: Secondary | ICD-10-CM | POA: Insufficient documentation

## 2011-06-12 DIAGNOSIS — R52 Pain, unspecified: Secondary | ICD-10-CM

## 2011-06-17 DIAGNOSIS — Z87898 Personal history of other specified conditions: Secondary | ICD-10-CM | POA: Insufficient documentation

## 2011-07-18 ENCOUNTER — Emergency Department (HOSPITAL_COMMUNITY)
Admission: EM | Admit: 2011-07-18 | Discharge: 2011-07-18 | Disposition: A | Discharge status: Home / Self Care | Payer: Medicaid Other | Attending: Emergency Medicine | Admitting: Emergency Medicine

## 2011-07-18 ENCOUNTER — Encounter (HOSPITAL_COMMUNITY): Payer: Self-pay

## 2011-07-18 ENCOUNTER — Emergency Department (HOSPITAL_COMMUNITY): Payer: Medicaid Other

## 2011-07-18 DIAGNOSIS — R42 Dizziness and giddiness: Secondary | ICD-10-CM | POA: Insufficient documentation

## 2011-07-18 DIAGNOSIS — F411 Generalized anxiety disorder: Secondary | ICD-10-CM | POA: Insufficient documentation

## 2011-07-18 DIAGNOSIS — R0602 Shortness of breath: Secondary | ICD-10-CM | POA: Insufficient documentation

## 2011-07-18 DIAGNOSIS — R51 Headache: Secondary | ICD-10-CM

## 2011-07-18 DIAGNOSIS — R0789 Other chest pain: Secondary | ICD-10-CM | POA: Insufficient documentation

## 2011-07-18 DIAGNOSIS — F419 Anxiety disorder, unspecified: Secondary | ICD-10-CM

## 2011-07-18 HISTORY — DX: Essential (primary) hypertension: I10

## 2011-07-18 HISTORY — DX: Panic disorder (episodic paroxysmal anxiety): F41.0

## 2011-07-18 MED ORDER — IBUPROFEN 600 MG PO TABS: 600.0000 mg | ORAL_TABLET | Freq: Four times a day (QID) | ORAL | Status: AC | PRN

## 2011-07-18 MED ORDER — DIAZEPAM 5 MG PO TABS: 5.0000 mg | ORAL_TABLET | Freq: Three times a day (TID) | ORAL | Status: AC | PRN

## 2011-07-18 NOTE — ED Notes (Signed)
Headache every day for 2 weeks-" starts at front of head and goes all down my neck to my back"  Tylenol taken just before arrival- no headache HX- GCS 15 no neuro deficits

## 2011-07-18 NOTE — ED Notes (Signed)
Patient transported to CT 

## 2011-07-18 NOTE — ED Provider Notes (Signed)
History     CSN: [id number]  Arrival date & time 07/18/11  1845   First MD Initiated Contact with Patient 07/18/11 1911      No chief complaint on file.   (Consider location/radiation/quality/duration/timing/severity/associated sxs/prior treatment) Patient is a 26 y.o. female presenting with headaches. The history is provided by the patient.  Headache  This is a new problem. The current episode started more than 1 week ago. The problem occurs constantly. The headache is associated with bright light and loud noise. The pain is located in the frontal and occipital region. The pain is at a severity of 8/10. Associated symptoms include shortness of breath.  Pt states she has never had headaches like this in the past. States pain always starts in the morning and subsides when goes to bed. Denies any trauma. Denies weakness, nausea, vomiting. States today pain got especially bad and she developed left chest tightness, tingling in hands, shortness of breath, and states had unstable gait. Pt states "I know something is wrong, I think I am having a stroke or a heart attack."   No past medical history on file.  No past surgical history on file.  No family history on file.  History  Substance Use Topics  . Smoking status: Not on file  . Smokeless tobacco: Not on file  . Alcohol Use: Not on file    OB History    No data available      Review of Systems  Constitutional: Negative.   Eyes: Negative.   Respiratory: Positive for chest tightness and shortness of breath.   Cardiovascular: Positive for chest pain.  Gastrointestinal: Negative.   Genitourinary: Negative.   Musculoskeletal: Negative.   Skin: Negative.   Neurological: Positive for dizziness and headaches.  Psychiatric/Behavioral: The patient is nervous/anxious.     Allergies  Review of patient's allergies indicates not on file.  Home Medications  No current outpatient prescriptions on file.  BP 135/86  Pulse 79   Temp(Src) 99.2 F (37.3 C) (Oral)  Resp 16  Wt 125 lb (56.7 kg)  SpO2 96%  Physical Exam  Nursing note and vitals reviewed. Constitutional: She is oriented to person, place, and time. She appears well-developed and well-nourished.       Pt is crying, anxious appearing  HENT:  Head: Normocephalic and atraumatic.  Eyes: Conjunctivae are normal.  Neck: Normal range of motion. Neck supple.       No meningismus  Cardiovascular: Normal rate, regular rhythm and normal heart sounds.   Pulmonary/Chest: Effort normal and breath sounds normal. No respiratory distress. She has no wheezes.  Abdominal: Soft. Bowel sounds are normal. There is no tenderness.  Musculoskeletal: Normal range of motion. She exhibits no edema.  Lymphadenopathy:    She has no cervical adenopathy.  Neurological: She is alert and oriented to person, place, and time. She displays normal reflexes. No cranial nerve deficit. She exhibits normal muscle tone. Coordination normal.  Skin: Skin is warm and dry. There is erythema.  Psychiatric:       anxious    ED Course  Procedures (including critical care time)  Pt with headaches for several weeks now. Normal neuro exam. Will get CT head. BP and VS normal. Pt afebrile. No neck stiffness.   Ct Head Wo Contrast  07/18/2011  *RADIOLOGY REPORT*  Clinical Data: Diffuse headache for 1 week.  CT HEAD WITHOUT CONTRAST  Technique:  Contiguous axial images were obtained from the base of the skull through the vertex without contrast.  Comparison: 07/08/2010  Findings: There is no acute intracranial hemorrhage, infarction, or mass lesion.  Brain parenchyma is normal.  Osseous structures are normal.  IMPRESSION: Normal exam.  Original Report Authenticated By: Gwynn Burly, M.D.   CT head negative. Pt appears very anxious. Pt did not take her xanax tonight. Started crying more and stated she has 4 kids, and she is a single mother, and she has "a lot going on."  Will d/c home with follow  up.    No diagnosis found.    MDM          Lottie Mussel, PA 07/19/11 [phone]

## 2011-07-19 NOTE — ED Provider Notes (Signed)
Medical screening examination/treatment/procedure(s) were performed by non-physician practitioner and as supervising physician I was immediately available for consultation/collaboration.  Anush Wiedeman, MD 07/19/11 2254 

## 2011-09-28 ENCOUNTER — Encounter (HOSPITAL_COMMUNITY): Payer: Self-pay | Admitting: Emergency Medicine

## 2011-09-28 ENCOUNTER — Emergency Department (INDEPENDENT_AMBULATORY_CARE_PROVIDER_SITE_OTHER)
Admission: EM | Admit: 2011-09-28 | Discharge: 2011-09-28 | Disposition: A | Discharge status: Home / Self Care | Payer: Medicaid Other | Source: Home / Self Care | Attending: Family Medicine | Admitting: Family Medicine

## 2011-09-28 DIAGNOSIS — J029 Acute pharyngitis, unspecified: Secondary | ICD-10-CM

## 2011-09-28 DIAGNOSIS — J310 Chronic rhinitis: Secondary | ICD-10-CM

## 2011-09-28 LAB — POCT RAPID STREP A: Streptococcus, Group A Screen (Direct): NEGATIVE

## 2011-09-28 MED ORDER — FLUTICASONE PROPIONATE 50 MCG/ACT NA SUSP: 2.0000 | Freq: Every day | NASAL | Status: DC

## 2011-09-28 NOTE — ED Notes (Signed)
PT HERE WITH SINUS/SORE THROAT THAT STARTED Friday NIGHT WITH SORE THROAT THEN WORSENED RADIATING PAIN WITH SWALLOWING,EARS AND THROB H/A.PT HAS BEEN TAKING OTC COLD/COUGH MEDS BUT STATES SX ARE WORSENING.DENIES FEVERS,N,V,D.PT ALSO REPORTS COUGH WITH BLOOD TINGED MUCOUS

## 2011-09-28 NOTE — Discharge Instructions (Signed)
Use an over-the-counter nasal saline spray such as Ayr, or [employer]'s Simply Saline, for congestion and moisture; use the prescription nasal steroid spray as directed. May also use an over the counter antihistamine such as loratadine (Claritin), cetirizine (Zyrtec), or fexofenadine (Allegra). Should you have fever, I recommend aggressive fever control with acetaminophen (Tylenol) and/or ibuprofen. You may use these together, alternating them every 4 hours, or individually, every 8 hours. For example, take acetaminophen 500 to 1000 mg at 12 noon, then 600 to 800 mg of ibuprofen at 4 pm, then acetaminophen at 8 pm, etc. Also, stay hydrated with clear liquids. Return to care should your symptoms not improve, or worsen in any way.

## 2011-09-28 NOTE — ED Provider Notes (Signed)
History     CSN: [id number]  Arrival date & time 09/28/11  4098   First MD Initiated Contact with Patient 09/28/11 [id number]      Chief Complaint  Patient presents with  . Sinusitis  . Sore Throat    (Consider location/radiation/quality/duration/timing/severity/associated sxs/prior treatment) HPI Comments: [name] presents for evaluation of nasal congestion, sore throat, ear fullness, productive cough over the last few days. She denies any fever.   Patient is a 26 y.o. female presenting with URI. The history is provided by the patient.  URI The primary symptoms include fatigue, headaches, ear pain, sore throat and cough. Primary symptoms do not include fever. The current episode started 3 to 5 days ago. This is a new problem. The problem has not changed since onset. Symptoms associated with the illness include congestion and rhinorrhea.    Past Medical History  Diagnosis Date  . Hypertension   . Panic attack     Past Surgical History  Procedure Date  . Ovary removed     right   . Breast cysts removed     No family history on file.  History  Substance Use Topics  . Smoking status: Never Smoker   . Smokeless tobacco: Not on file  . Alcohol Use: No    OB History    Grav Para Term Preterm Abortions TAB SAB Ect Mult Living                  Review of Systems  Constitutional: Positive for fatigue. Negative for fever.  HENT: Positive for ear pain, congestion, sore throat and rhinorrhea.   Eyes: Negative.   Respiratory: Positive for cough.   Cardiovascular: Negative.   Gastrointestinal: Negative.   Genitourinary: Negative.   Musculoskeletal: Negative.   Skin: Negative.   Neurological: Positive for headaches.    Allergies  Review of patient's allergies indicates no known allergies.  Home Medications   Current Outpatient Rx  Name Route Sig Dispense Refill  . ALPRAZOLAM 0.25 MG PO TABS Oral Take 0.25 mg by mouth at bedtime as needed. For anxiety    .  FLUTICASONE PROPIONATE 50 MCG/ACT NA SUSP Nasal Place 2 sprays into the nose daily. 16 g 2  . HYDROCHLOROTHIAZIDE 12.5 MG PO CAPS Oral Take 12.5 mg by mouth daily.    Marland Kitchen LABETALOL HCL 100 MG PO TABS Oral Take 100 mg by mouth 2 (two) times daily.    . ADULT MULTIVITAMIN W/MINERALS CH Oral Take 1 tablet by mouth daily.    Marland Kitchen OMEPRAZOLE 20 MG PO CPDR Oral Take 20 mg by mouth daily.      BP 107/64  Pulse 80  Temp(Src) 98.3 F (36.8 C) (Oral)  Resp 14  SpO2 98%  LMP 09/08/2011  Physical Exam  Nursing note and vitals reviewed. Constitutional: She is oriented to person, place, and time. She appears well-developed and well-nourished.  HENT:  Head: Normocephalic and atraumatic.  Right Ear: Tympanic membrane is retracted.  Left Ear: Tympanic membrane is retracted.  Nose: Nose normal.  Mouth/Throat: Uvula is midline, oropharynx is clear and moist and mucous membranes are normal.  Eyes: EOM are normal.  Neck: Normal range of motion.  Cardiovascular: Normal rate, regular rhythm, S1 normal, S2 normal and normal heart sounds.   No murmur heard. Pulmonary/Chest: Effort normal and breath sounds normal. She has no decreased breath sounds. She has no wheezes. She has no rhonchi.  Musculoskeletal: Normal range of motion.  Neurological: She is alert and oriented to person, place,  and time.  Skin: Skin is warm and dry.  Psychiatric: Her behavior is normal.    ED Course  Procedures (including critical care time)   Labs Reviewed  POCT RAPID STREP A ([facility] CARE ONLY)  LAB REPORT - SCANNED   No results found.   1. Pharyngitis   2. Rhinitis       MDM  Given rx for fluticasone        Renaee Munda, MD 10/15/11 1256

## 2011-10-27 ENCOUNTER — Telehealth: Payer: Self-pay | Admitting: Obstetrics and Gynecology

## 2011-10-27 ENCOUNTER — Telehealth: Payer: Self-pay

## 2011-10-27 NOTE — Telephone Encounter (Signed)
Pt returned call and was given an appt.for bad headaches and cramping x 1 wk. Also missed cycle. Pt also stated that she did a home pregnancy test and it may be positive. Ov scheduled for 10/29/2011@10 ;15 with Dr. Stefano Gaul. Mathis Bud

## 2011-10-27 NOTE — Telephone Encounter (Signed)
Pt called today complaining of cramping and needed an appt. Left pt. a message to return call.[name]

## 2011-10-28 DIAGNOSIS — Z87898 Personal history of other specified conditions: Secondary | ICD-10-CM

## 2011-10-29 ENCOUNTER — Encounter: Payer: Self-pay | Admitting: Obstetrics and Gynecology

## 2011-10-29 ENCOUNTER — Ambulatory Visit (INDEPENDENT_AMBULATORY_CARE_PROVIDER_SITE_OTHER): Payer: Medicaid Other | Admitting: Obstetrics and Gynecology

## 2011-10-29 VITALS — BP 100/60 | Ht 65.0 in | Wt 118.0 lb

## 2011-10-29 DIAGNOSIS — Z331 Pregnant state, incidental: Secondary | ICD-10-CM

## 2011-10-29 DIAGNOSIS — R51 Headache: Secondary | ICD-10-CM

## 2011-10-29 DIAGNOSIS — N926 Irregular menstruation, unspecified: Secondary | ICD-10-CM

## 2011-10-29 LAB — POCT URINE PREGNANCY: Preg Test, Ur: POSITIVE

## 2011-10-29 NOTE — Progress Notes (Signed)
Pt c/o headaches for a week no relief with tylenol/ ibuprofen  Pt c/o cramping for week lower left side radiating to back  Pt also c/o breast tenderness  [name] is a 26 y.o. year old female,[id number], who presents for a problem visit.her last menstrual period was September 30, 2011.  Subjective:  The patient complains of a vague headache and lower abdominal discomfort. She is currently taking labetalol and hydrochlorothiazide.  Objective:  BP 100/60  Ht 5\' 5"  (1.651 m)  Wt 118 lb (53.524 kg)  BMI 19.64 kg/m2  LMP 09/30/2011   General: alert, cooperative and no distress Resp: clear to auscultation bilaterally Cardio: regular rate and rhythm, S1, S2 normal, no murmur, click, rub or gallop GI: soft, non-tender; bowel sounds normal; no masses,  no organomegaly  External genitalia: normal general appearance Vaginal: normal without tenderness, induration or masses and relaxation noted Cervix: normal appearance Adnexa: normal bimanual exam Uterus: normal size shape and consistency, nontender  Pregnancy test positive  Assessment:  4 week and 1 day gestation Vague headache Vague abdominal discomfort  Plan:  Return for ultrasound in 2 weeks Discontinue hydrochlorothiazide  Return to office prn if symptoms worsen or fail to improve.   Leonard Schwartz M.D.  10/29/2011 7:40 PM

## 2011-11-11 ENCOUNTER — Telehealth: Payer: Self-pay | Admitting: Obstetrics and Gynecology

## 2011-11-11 NOTE — Telephone Encounter (Signed)
TC to pt. LM to return call regarding message.  To call after hours if unable to reach office before closing.

## 2011-11-11 NOTE — Telephone Encounter (Signed)
[name]/epic 

## 2011-11-12 ENCOUNTER — Encounter: Payer: Self-pay | Admitting: Obstetrics and Gynecology

## 2011-11-12 ENCOUNTER — Ambulatory Visit (INDEPENDENT_AMBULATORY_CARE_PROVIDER_SITE_OTHER): Payer: Medicaid Other | Admitting: Obstetrics and Gynecology

## 2011-11-12 ENCOUNTER — Telehealth: Payer: Self-pay | Admitting: Obstetrics and Gynecology

## 2011-11-12 ENCOUNTER — Ambulatory Visit (INDEPENDENT_AMBULATORY_CARE_PROVIDER_SITE_OTHER): Payer: Medicaid Other

## 2011-11-12 ENCOUNTER — Other Ambulatory Visit: Payer: Self-pay | Admitting: Obstetrics and Gynecology

## 2011-11-12 VITALS — BP 100/60 | Wt 122.0 lb

## 2011-11-12 DIAGNOSIS — I10 Essential (primary) hypertension: Secondary | ICD-10-CM

## 2011-11-12 DIAGNOSIS — N926 Irregular menstruation, unspecified: Secondary | ICD-10-CM

## 2011-11-12 LAB — US OB TRANSVAGINAL

## 2011-11-12 NOTE — Progress Notes (Signed)
1)Pt has left side pain radiating down to thigh x few days US shows a Left corpus luteum cyst, nl fetal heat motion. 2)HBP Has DC'd HCTZ 2wks ago  Rec:  Taper labetolol to d/c over next 2 wks   RTO for NOB interview and workup

## 2011-11-12 NOTE — Telephone Encounter (Signed)
TC to pt.   Left message to keep appt today or to call if has concerns.

## 2011-11-13 DIAGNOSIS — I10 Essential (primary) hypertension: Secondary | ICD-10-CM | POA: Insufficient documentation

## 2011-11-26 ENCOUNTER — Telehealth: Payer: Self-pay | Admitting: Obstetrics and Gynecology

## 2011-11-26 NOTE — Telephone Encounter (Signed)
TC to pt. LM to return call regarding message. 

## 2011-11-26 NOTE — Telephone Encounter (Signed)
Triage/gen. [facility]. 

## 2011-12-06 ENCOUNTER — Telehealth: Payer: Self-pay | Admitting: Obstetrics and Gynecology

## 2011-12-06 NOTE — Telephone Encounter (Signed)
Discussed symptoms and may go to urgent care make sure they are aware of your pregnancy plan and may need medication to improve, f/o as scheduled. Lavera Guise, CNM

## 2011-12-08 ENCOUNTER — Ambulatory Visit (INDEPENDENT_AMBULATORY_CARE_PROVIDER_SITE_OTHER): Payer: Medicaid Other | Admitting: Obstetrics and Gynecology

## 2011-12-08 ENCOUNTER — Encounter: Payer: Self-pay | Admitting: Obstetrics and Gynecology

## 2011-12-08 VITALS — BP 100/70 | Temp 98.8°F | Ht 65.0 in | Wt 124.0 lb

## 2011-12-08 DIAGNOSIS — Z87898 Personal history of other specified conditions: Secondary | ICD-10-CM

## 2011-12-08 DIAGNOSIS — R87619 Unspecified abnormal cytological findings in specimens from cervix uteri: Secondary | ICD-10-CM

## 2011-12-08 DIAGNOSIS — Z22330 Carrier of Group B streptococcus: Secondary | ICD-10-CM

## 2011-12-08 DIAGNOSIS — G5601 Carpal tunnel syndrome, right upper limb: Secondary | ICD-10-CM

## 2011-12-08 DIAGNOSIS — N949 Unspecified condition associated with female genital organs and menstrual cycle: Secondary | ICD-10-CM

## 2011-12-08 DIAGNOSIS — N83201 Unspecified ovarian cyst, right side: Secondary | ICD-10-CM

## 2011-12-08 DIAGNOSIS — Z8619 Personal history of other infectious and parasitic diseases: Secondary | ICD-10-CM | POA: Insufficient documentation

## 2011-12-08 DIAGNOSIS — D649 Anemia, unspecified: Secondary | ICD-10-CM | POA: Insufficient documentation

## 2011-12-08 DIAGNOSIS — B009 Herpesviral infection, unspecified: Secondary | ICD-10-CM

## 2011-12-08 DIAGNOSIS — K219 Gastro-esophageal reflux disease without esophagitis: Secondary | ICD-10-CM

## 2011-12-08 DIAGNOSIS — IMO0002 Reserved for concepts with insufficient information to code with codable children: Secondary | ICD-10-CM

## 2011-12-08 DIAGNOSIS — R102 Pelvic and perineal pain: Secondary | ICD-10-CM

## 2011-12-08 DIAGNOSIS — O1490 Unspecified pre-eclampsia, unspecified trimester: Secondary | ICD-10-CM

## 2011-12-08 DIAGNOSIS — F410 Panic disorder [episodic paroxysmal anxiety] without agoraphobia: Secondary | ICD-10-CM | POA: Insufficient documentation

## 2011-12-08 DIAGNOSIS — M6208 Separation of muscle (nontraumatic), other site: Secondary | ICD-10-CM

## 2011-12-08 DIAGNOSIS — J988 Other specified respiratory disorders: Secondary | ICD-10-CM

## 2011-12-08 DIAGNOSIS — Z331 Pregnant state, incidental: Secondary | ICD-10-CM

## 2011-12-08 DIAGNOSIS — R6889 Other general symptoms and signs: Secondary | ICD-10-CM

## 2011-12-08 LAB — POCT URINALYSIS DIPSTICK
Bilirubin, UA: NEGATIVE
Blood, UA: NEGATIVE
Ketones, UA: NEGATIVE
Leukocytes, UA: NEGATIVE
Protein, UA: NEGATIVE
pH, UA: 6

## 2011-12-08 MED ORDER — AZITHROMYCIN 500 MG PO TABS: ORAL_TABLET | ORAL | Status: DC

## 2011-12-08 NOTE — Progress Notes (Signed)
Pt states has had a sore throat, had a fever on Sunday of 101.1, has since gone down, but is coughing up green sputum w/ streaks of blood seen in it. Pt states has had sxs of strep throat before so wants to have her throat checked.  Per EP ok, pt worked in.

## 2011-12-08 NOTE — Progress Notes (Signed)
26 YO 9 weeks 5 days pregnant, complains of fever, headache and chills several days ago.  Admits to ear pain, had a fever of 101 degrees, myalgias and had taken OTC medications.  Fever resolved after 24 hours but now sore throat and post nasal drainage that is purulent.   O: ENT: TMs with air fluid level, no erythema; naris on left is 90 % occluded with mucoid bridging, right naris 50 % occluded; oropharynx-inflamed but no exudate;   Neck: no adenopathy Lungs: clear to auscultation Heart: RRR  A: URI   P:  Increase hydration           Saline nasal spray qid       Z-pak # 6 2 po stat then 1 po qd until completed       RTO-as scheduled  Evon Dejarnett, PA-C

## 2011-12-08 NOTE — Patient Instructions (Signed)
Use saline nasal spray in each nostril 4 times a day for 5 days.

## 2011-12-09 LAB — PRENATAL PANEL VII
Antibody Screen: NEGATIVE
Basophils Absolute: 0 10*3/uL (ref 0.0–0.1)
Basophils Relative: 0 % (ref 0–1)
Eosinophils Relative: 1 % (ref 0–5)
HCT: 35.7 % — ABNORMAL LOW (ref 36.0–46.0)
HIV: NONREACTIVE
Hemoglobin: 12.2 g/dL (ref 12.0–15.0)
Lymphocytes Relative: 19 % (ref 12–46)
MCHC: 34.2 g/dL (ref 30.0–36.0)
MCV: 75.6 fL — ABNORMAL LOW (ref 78.0–100.0)
Monocytes Absolute: 0.8 10*3/uL (ref 0.1–1.0)
Monocytes Relative: 7 % (ref 3–12)
RDW: 13.8 % (ref 11.5–15.5)
Rh Type: POSITIVE

## 2011-12-10 LAB — HEMOGLOBINOPATHY EVALUATION
Hemoglobin Other: 0 %
Hgb F Quant: 0 % (ref 0.0–2.0)
Hgb S Quant: 0 %

## 2011-12-14 ENCOUNTER — Ambulatory Visit (INDEPENDENT_AMBULATORY_CARE_PROVIDER_SITE_OTHER): Payer: Medicaid Other | Admitting: Obstetrics and Gynecology

## 2011-12-14 ENCOUNTER — Encounter: Payer: Self-pay | Admitting: Obstetrics and Gynecology

## 2011-12-14 VITALS — BP 130/86 | Wt 127.0 lb

## 2011-12-14 DIAGNOSIS — Z331 Pregnant state, incidental: Secondary | ICD-10-CM

## 2011-12-14 LAB — POCT WET PREP (WET MOUNT): Clue Cells Wet Prep Whiff POC: NEGATIVE

## 2011-12-14 LAB — POCT URINALYSIS DIPSTICK
Glucose, UA: NEGATIVE
Leukocytes, UA: NEGATIVE
Nitrite, UA: NEGATIVE
Protein, UA: NEGATIVE
Urobilinogen, UA: NEGATIVE

## 2011-12-14 MED ORDER — PANTOPRAZOLE SODIUM 40 MG PO TBEC: 40.0000 mg | DELAYED_RELEASE_TABLET | Freq: Every day | ORAL | Status: DC

## 2011-12-14 NOTE — Progress Notes (Signed)
1st trimester screen Off labetalol Protonix  Subjective:    [name] is being seen today for her first obstetrical visit.  She reports reflux despite Prilosec.  Stopped HCTZ in the 1st trimester and Labetalol early June, due to severe HAs.  No current meds. She is at [redacted]w[redacted]d gestation by LMP. Wants 1st trimester screen.   Her obstetrical history is significant for: PP hypertension, then chronic hypertension Hx panic attacks Hx abnormal pap Patient Active Problem List  Diagnosis  . History of chest pain  . Pregnant state, incidental  . Hypertension  . Herpes  . GBS carrier  . H/O varicella  . Abnormal Pap smear  . Pelvic pain  . Diastasis recti  . H/O chest pain  . Panic attack  . Right ovarian cyst  . Preeclampsia  . Anemia  . GERD  . Carpal tunnel syndrome of right wrist      Relationship with FOB:  Same partner as previous pregnancies Patient does intend to breast feed.  Pregnancy history fully reviewed.   The following portions of the patient's history were reviewed and updated as appropriate: allergies, current medications, past family history, past medical history, past social history, past surgical history and problem list.  Review of Systems Pertinent ROS is described in HPI   Objective:   BP 130/86  Wt 127 lb (57.607 kg)  LMP 10/01/2011 Wt Readings from Last 1 Encounters:  12/14/11 127 lb (57.607 kg)   BMI: There is no height on file to calculate BMI.  General: alert, cooperative and no distress Respiratory: clear to auscultation bilaterally Cardiovascular: regular rate and rhythm, S1, S2 normal, no murmur Gastrointestinal: soft, non-tender; no masses,  no organomegaly Extremities: extremities normal, no pain or edema Vaginal Bleeding: none  EXTERNAL GENITALIA: normal appearing vulva with no masses, tenderness or lesions VAGINA: no abnormal discharge or lesions CERVIX: no lesions or cervical motion tenderness UTERUS: gravid and consistent  with 11 weeks ADNEXA: no masses palpable and nontender OB EXAM PELVIMETRY: appears adequate   FHR:  160  bpm  Assessment:    Pregnancy at  10 4/7 weeks Chronic hypertension--off meds since early 1st trimester Plan:     Prenatal panel reviewed and discussed with the patient:yes Pap smear collected:  No--due in 1/14. GC/Chlamydia collected:yes Discussion of Genetic testing options:  Desires 1st trimester screen Prenatal vitamins recommended Problem list reviewed and updated.  Plan of care: Follow up in 2 weeks for 1st trimester screen and ROB for BP check  Rx Protonix 40 mg po q day, refills x 4.  Nigel Bridgeman CNM, [place] 12/14/2011 6:44 PM

## 2011-12-14 NOTE — Progress Notes (Signed)
Pap done 06/2011 wnl.

## 2011-12-15 ENCOUNTER — Encounter: Payer: Medicaid Other | Admitting: Obstetrics and Gynecology

## 2011-12-15 LAB — GC/CHLAMYDIA PROBE AMP, GENITAL
Chlamydia, DNA Probe: NEGATIVE
GC Probe Amp, Genital: NEGATIVE

## 2012-01-05 ENCOUNTER — Other Ambulatory Visit: Payer: Medicaid Other

## 2012-01-05 ENCOUNTER — Encounter: Payer: Medicaid Other | Admitting: Obstetrics and Gynecology

## 2012-01-06 ENCOUNTER — Encounter: Payer: Medicaid Other | Admitting: Obstetrics and Gynecology

## 2012-01-06 ENCOUNTER — Other Ambulatory Visit: Payer: Self-pay | Admitting: Obstetrics and Gynecology

## 2012-01-06 ENCOUNTER — Ambulatory Visit (INDEPENDENT_AMBULATORY_CARE_PROVIDER_SITE_OTHER): Payer: Medicaid Other

## 2012-01-06 DIAGNOSIS — Z36 Encounter for antenatal screening of mother: Secondary | ICD-10-CM

## 2012-01-06 DIAGNOSIS — Z331 Pregnant state, incidental: Secondary | ICD-10-CM

## 2012-01-06 LAB — US OB COMP LESS 14 WKS

## 2012-01-07 ENCOUNTER — Telehealth: Payer: Self-pay | Admitting: Obstetrics and Gynecology

## 2012-01-07 ENCOUNTER — Ambulatory Visit (INDEPENDENT_AMBULATORY_CARE_PROVIDER_SITE_OTHER): Payer: Medicaid Other | Admitting: Obstetrics and Gynecology

## 2012-01-07 VITALS — BP 100/62 | Wt 130.0 lb

## 2012-01-07 DIAGNOSIS — K3189 Other diseases of stomach and duodenum: Secondary | ICD-10-CM

## 2012-01-07 DIAGNOSIS — R55 Syncope and collapse: Secondary | ICD-10-CM

## 2012-01-07 DIAGNOSIS — K219 Gastro-esophageal reflux disease without esophagitis: Secondary | ICD-10-CM

## 2012-01-07 DIAGNOSIS — K30 Functional dyspepsia: Secondary | ICD-10-CM

## 2012-01-07 LAB — CBC
Hemoglobin: 11.3 g/dL — ABNORMAL LOW (ref 12.0–15.0)
MCH: 25.9 pg — ABNORMAL LOW (ref 26.0–34.0)
MCHC: 34.1 g/dL (ref 30.0–36.0)
RDW: 14 % (ref 11.5–15.5)

## 2012-01-07 NOTE — Progress Notes (Signed)
C/o nausea, diarrhea, constipation, and heartburn no relief with OTC C/o visual disturbances and syncope pt ate a [place] sandwich sx's improved

## 2012-01-07 NOTE — Telephone Encounter (Signed)
Linda/ep pt. °

## 2012-01-07 NOTE — Telephone Encounter (Signed)
TC TO PT REGARDING MESSAGE. PT STATES THAT SHE IS HAVING EXTREME NAUSEA TO THE POINT OF FEELING FAINT AND SHE IS HAVING HEARTBURN. PT STATES THAT SHE IS TAKING RX PROTONIX WITH NO RELIEF. PT STATES SHE IS CONSTIPATED AND WHEN SHE GET RELIEF SHE HAS DIARRHEA. SCHEDULED PT APPT WITH DEE AT 3:30PM TODAY. PT COULD NOT COME AT AN EARLIER TIME. PT UNDERSTANDS.

## 2012-01-07 NOTE — Progress Notes (Signed)
[redacted]w[redacted]d C/o nausea and intermittent loose stool with constipation. Also states that she has seen spots on front of her eyes at intervals. Discussed problems and it appears that she could have IBS prior to pregnancy. Has taken Zantac and Prilosec for acid reflux and the nausea has been unresolved with Zofran or Phenergan To commence Nexium and will need Medic Aid approval To have CBC and CMP  Today  Advised to each tomatoes for potassium and drink gatorade at intervals for electrolyte replacement. F/u 4 weeks

## 2012-01-08 LAB — COMPREHENSIVE METABOLIC PANEL
AST: 14 U/L (ref 0–37)
Alkaline Phosphatase: 35 U/L — ABNORMAL LOW (ref 39–117)
BUN: 9 mg/dL (ref 6–23)
Glucose, Bld: 77 mg/dL (ref 70–99)
Sodium: 136 mEq/L (ref 135–145)
Total Bilirubin: 0.2 mg/dL — ABNORMAL LOW (ref 0.3–1.2)
Total Protein: 6.3 g/dL (ref 6.0–8.3)

## 2012-01-11 ENCOUNTER — Other Ambulatory Visit: Payer: Self-pay | Admitting: Obstetrics and Gynecology

## 2012-01-11 NOTE — Telephone Encounter (Signed)
Please see msg

## 2012-01-11 NOTE — Telephone Encounter (Signed)
Triage/epic 

## 2012-01-12 ENCOUNTER — Telehealth: Payer: Self-pay | Admitting: Obstetrics and Gynecology

## 2012-01-12 NOTE — Telephone Encounter (Signed)
Pam/epic 

## 2012-01-12 NOTE — Telephone Encounter (Signed)
Pt called regarding RX for Nexium, wanted to know if Medicaid approved payment considering pt's trial of other medications. Pt was advised that Medicaid has not yet responded per CMA Tyvonna. Pt is currently taking Protonix and stated that it is not working and may have to pay out of pocket for the Nexium. Pt was advised to call the pharmacy and get out of pocket cost and to call the office back to let us know what she prefers. Mathis Bud

## 2012-01-13 MED ORDER — ESOMEPRAZOLE MAGNESIUM 40 MG PO PACK: 40.0000 mg | PACK | Freq: Every day | ORAL | Status: DC

## 2012-01-13 NOTE — Addendum Note (Signed)
Addended by: Janeece Agee on: 01/13/2012 04:58 PM   Modules accepted: Orders

## 2012-02-02 ENCOUNTER — Telehealth: Payer: Self-pay

## 2012-02-04 ENCOUNTER — Ambulatory Visit (INDEPENDENT_AMBULATORY_CARE_PROVIDER_SITE_OTHER): Payer: Medicaid Other | Admitting: Obstetrics and Gynecology

## 2012-02-04 ENCOUNTER — Encounter: Payer: Self-pay | Admitting: Obstetrics and Gynecology

## 2012-02-04 VITALS — BP 122/62 | Wt 136.0 lb

## 2012-02-04 DIAGNOSIS — I10 Essential (primary) hypertension: Secondary | ICD-10-CM

## 2012-02-04 DIAGNOSIS — G4700 Insomnia, unspecified: Secondary | ICD-10-CM

## 2012-02-04 DIAGNOSIS — Z331 Pregnant state, incidental: Secondary | ICD-10-CM

## 2012-02-04 NOTE — Patient Instructions (Signed)
OTC:  [facility]    Diet and Irritable Bowel Syndrome  No cure has been found for irritable bowel syndrome (IBS). Many options are available to treat the symptoms. Your caregiver will give you the best treatments available for your symptoms. He or she will also encourage you to manage stress and to make changes to your diet. You need to work with your caregiver and Registered Dietician to find the best combination of medicine, diet, counseling, and support to control your symptoms. The following are some diet suggestions. FOODS THAT MAKE IBS WORSE  Fatty foods, such as [place] fries.   Milk products, such as cheese or ice cream.   Chocolate.   Alcohol.   Caffeine (found in coffee and some sodas).   Carbonated drinks, such as soda.  If certain foods cause symptoms, you should eat less of them or stop eating them. FOOD JOURNAL   Keep a journal of the foods that seem to cause distress. Write down:   What you are eating during the day and when.   What problems you are having after eating.   When the symptoms occur in relation to your meals.   What foods always make you feel badly.   Take your notes with you to your caregiver to see if you should stop eating certain foods.  FOODS THAT MAKE IBS BETTER Fiber reduces IBS symptoms, especially constipation, because it makes stools soft, bulky, and easier to pass. Fiber is found in bran, bread, cereal, beans, fruit, and vegetables. Examples of foods with fiber include:  Apples.   Peaches.   Pears.   Berries.   Figs.   Broccoli, raw.   Cabbage.   Carrots.   Raw peas.   Kidney beans.   Lima beans.   Whole-grain bread.   Whole-grain cereal.  Add foods with fiber to your diet a little at a time. This will let your body get used to them. Too much fiber at once might cause gas and swelling of your abdomen. This can trigger symptoms in a person with IBS. Caregivers usually recommend a diet with enough fiber to produce soft,  painless bowel movements. High fiber diets may cause gas and bloating. However, these symptoms often go away within a few weeks, as your body adjusts. In many cases, dietary fiber may lessen IBS symptoms, particularly constipation. However, it may not help pain or diarrhea. High fiber diets keep the colon mildly enlarged (distended) with the added fiber. This may help prevent spasms in the colon. Some forms of fiber also keep water in the stool, thereby preventing hard stools that are difficult to pass.  Besides telling you to eat more foods with fiber, your caregiver may also tell you to get more fiber by taking a fiber pill or drinking water mixed with a special high fiber powder. An example of this is a natural fiber laxative containing psyllium seed.  TIPS  Large meals can cause cramping and diarrhea in people with IBS. If this happens to you, try eating 4 or 5 small meals a day, or try eating less at each of your usual 3 meals. It may also help if your meals are low in fat and high in carbohydrates. Examples of carbohydrates are pasta, rice, whole-grain breads and cereals, fruits, and vegetables.   If dairy products cause your symptoms to flare up, you can try eating less of those foods. You might be able to handle yogurt better than other dairy products, because it contains bacteria that helps  with digestion. Dairy products are an important source of calcium and other nutrients. If you need to avoid dairy products, be sure to talk with a Registered Dietitian about getting these nutrients through other food sources.   Drink enough water and fluids to keep your urine clear or pale yellow. This is important, especially if you have diarrhea.  FOR MORE INFORMATION  International Foundation for Functional Gastrointestinal Disorders: www.iffgd.org  National Digestive Diseases Information Clearinghouse: digestive.StageSync.si Document Released: 08/22/2003 Document Revised: 05/21/2011 Document Reviewed:  05/09/2007 [facility] Patient Information 2012 [place], [place].

## 2012-02-04 NOTE — Progress Notes (Signed)
[redacted]w[redacted]d Pt c/o having nightmares during the night and when she wakes she is not able to go back to sleep.Will rx ambien after 20 weeks.  Counseling offered and accepted Also c/o having heartburn , mostly at night. Better with Nexium.  Eats just before going to sleep.  Pt also c/o having increased gas. Has alternating diarrhea and constipation.  Allign recommended for probable IBS sx. [place] for anatomy NV

## 2012-02-05 ENCOUNTER — Telehealth: Payer: Self-pay

## 2012-02-05 NOTE — Telephone Encounter (Signed)
Tc to pt. Pt to be referred to counselor for traumatic life event that occurred. [facility] information given to pt to call and sched appt on own per their protocol. Pt voices understanding.

## 2012-02-10 ENCOUNTER — Other Ambulatory Visit: Payer: Self-pay

## 2012-02-10 ENCOUNTER — Ambulatory Visit (INDEPENDENT_AMBULATORY_CARE_PROVIDER_SITE_OTHER): Payer: Medicaid Other | Admitting: Obstetrics and Gynecology

## 2012-02-10 ENCOUNTER — Ambulatory Visit (INDEPENDENT_AMBULATORY_CARE_PROVIDER_SITE_OTHER): Payer: Medicaid Other

## 2012-02-10 VITALS — BP 94/60 | Wt 133.0 lb

## 2012-02-10 DIAGNOSIS — Z3689 Encounter for other specified antenatal screening: Secondary | ICD-10-CM

## 2012-02-10 DIAGNOSIS — Z331 Pregnant state, incidental: Secondary | ICD-10-CM

## 2012-02-10 NOTE — Progress Notes (Signed)
Pt states she has no concerns today.  

## 2012-02-10 NOTE — Progress Notes (Signed)
[redacted]w[redacted]d Doing well. Alpha-fetoprotein sent. Sterilization procedure reviewed.  Questions answered.  BTL papers signed. Ultrasound: Single gestation, normal fluid, normal anatomy, female, 280 g (65th percentile), cervix 3.32 cm. Return to office in 4 weeks. Dr. Stefano Gaul

## 2012-02-11 ENCOUNTER — Other Ambulatory Visit: Payer: Medicaid Other

## 2012-02-11 LAB — US OB COMP + 14 WK

## 2012-02-11 LAB — ALPHA FETOPROTEIN, MATERNAL: Open Spina bifida: NEGATIVE

## 2012-02-29 ENCOUNTER — Encounter: Payer: Self-pay | Admitting: Obstetrics and Gynecology

## 2012-02-29 ENCOUNTER — Ambulatory Visit (INDEPENDENT_AMBULATORY_CARE_PROVIDER_SITE_OTHER): Payer: Medicaid Other | Admitting: Obstetrics and Gynecology

## 2012-02-29 VITALS — BP 102/60 | Wt 139.0 lb

## 2012-02-29 DIAGNOSIS — R3915 Urgency of urination: Secondary | ICD-10-CM

## 2012-02-29 LAB — POCT URINALYSIS DIPSTICK
Glucose, UA: NEGATIVE
Ketones, UA: NEGATIVE
Spec Grav, UA: 1.025

## 2012-02-29 NOTE — Progress Notes (Signed)
[redacted]w[redacted]d Pt c/o possible UTI. Also c/o bump on left vaginal area.  Urine analysis is negative. Examination of the perineum: No lesions seen. HSV 1 discussed. AFP within normal limits. Return to office in 4 weeks. Dr. Stefano Gaul

## 2012-03-09 ENCOUNTER — Encounter: Payer: Medicaid Other | Admitting: Obstetrics and Gynecology

## 2012-03-28 ENCOUNTER — Ambulatory Visit (INDEPENDENT_AMBULATORY_CARE_PROVIDER_SITE_OTHER): Payer: Medicaid Other | Admitting: Obstetrics and Gynecology

## 2012-03-28 ENCOUNTER — Encounter: Payer: Self-pay | Admitting: Obstetrics and Gynecology

## 2012-03-28 VITALS — BP 122/60 | Wt 147.0 lb

## 2012-03-28 DIAGNOSIS — Z331 Pregnant state, incidental: Secondary | ICD-10-CM

## 2012-03-28 DIAGNOSIS — R52 Pain, unspecified: Secondary | ICD-10-CM

## 2012-03-28 MED ORDER — IBUPROFEN 800 MG PO TABS: 800.0000 mg | ORAL_TABLET | Freq: Three times a day (TID) | ORAL | Status: DC | PRN

## 2012-03-28 NOTE — Progress Notes (Signed)
Pt c/o swelling in right hand when she wakes up in the morning. Also c/o hip pain.

## 2012-03-28 NOTE — Progress Notes (Signed)
[redacted]w[redacted]d The patient complains of swelling in her right hand in the mornings.  She complains of bilateral hip pain.  Hand is nontender.  No swelling today. Try ibuprofen 800 mg every 8 hours as needed.  Discontinue at 28 weeks. Return to office in 4 weeks. Glucola next visit. Dr. Stefano Gaul

## 2012-03-31 ENCOUNTER — Ambulatory Visit (HOSPITAL_COMMUNITY)
Admission: RE | Admit: 2012-03-31 | Discharge: 2012-03-31 | Disposition: A | Discharge status: Home / Self Care | Payer: Medicaid Other | Source: Ambulatory Visit | Attending: Obstetrics and Gynecology | Admitting: Obstetrics and Gynecology

## 2012-03-31 ENCOUNTER — Other Ambulatory Visit: Payer: Self-pay | Admitting: Obstetrics and Gynecology

## 2012-03-31 ENCOUNTER — Telehealth: Payer: Self-pay | Admitting: Obstetrics and Gynecology

## 2012-03-31 ENCOUNTER — Ambulatory Visit (INDEPENDENT_AMBULATORY_CARE_PROVIDER_SITE_OTHER): Payer: Medicaid Other | Admitting: Obstetrics and Gynecology

## 2012-03-31 ENCOUNTER — Encounter: Payer: Self-pay | Admitting: Obstetrics and Gynecology

## 2012-03-31 VITALS — BP 102/74 | Wt 146.0 lb

## 2012-03-31 DIAGNOSIS — M79606 Pain in leg, unspecified: Secondary | ICD-10-CM

## 2012-03-31 DIAGNOSIS — M79609 Pain in unspecified limb: Secondary | ICD-10-CM

## 2012-03-31 DIAGNOSIS — I82409 Acute embolism and thrombosis of unspecified deep veins of unspecified lower extremity: Secondary | ICD-10-CM

## 2012-03-31 DIAGNOSIS — O99891 Other specified diseases and conditions complicating pregnancy: Secondary | ICD-10-CM | POA: Insufficient documentation

## 2012-03-31 NOTE — Progress Notes (Signed)
Pt c/o left calf pain getting worse over the past few days. Pain is worse at night. Pt also c/o swelling in legs at night.

## 2012-03-31 NOTE — Progress Notes (Signed)
*  Preliminary Results* Bilateral lower extremity venous duplex completed. Bilateral lower extremities are negative for deep vein thrombosis. There is evidence of rouleaux flow throughout bilateral lower extremities, with trickle flow in the left popliteal vein. No evidence of Baker's cyst bilaterally. Preliminary results discussed with Tyvona, RN.  03/31/2012 12:12 PM Gertie Fey, RDMS, RDCS

## 2012-03-31 NOTE — Progress Notes (Signed)
Patient ID: [name], female   DOB: Aug 08, 1985, 26 y.o.   MRN: [id number] [redacted]w[redacted]d L calf outter L to posterior calf sore x 2 days with moving is worse. -Homan's sign R + on L side, no edema bilaterally and PPP bilaterally. See report Venous duplex out pt study reviewed with Dr. Stefano Gaul Negative result, tylenol or motrin for discomfort, ice, report if s/s change or worsen. F/o as scheduled. Called pt and left message to call us. Lavera Guise, CNM

## 2012-03-31 NOTE — Telephone Encounter (Signed)
TC from pt.   States has had pain in left calf x a few days.  Hurts when walking.  Tender to  touch. Increased pain when flexing foot. Sched for eval with MK today.

## 2012-04-04 ENCOUNTER — Encounter: Payer: Medicaid Other | Admitting: Obstetrics and Gynecology

## 2012-04-18 ENCOUNTER — Telehealth: Payer: Self-pay | Admitting: Obstetrics and Gynecology

## 2012-04-18 NOTE — Telephone Encounter (Signed)
Pt called, states she has extreme hip pain.  When she goes from sitting to standing or from lying down to standing the pain is sharp.  Goes from her hip down her leg and to her knee.  It is difficult to put any pressure on her left leg.  Tried tylenol without any relief and does not want to take any rx'd meds.  Pt advised to come in for an appt.  Is unable to keep 11/11 appt and would like to come in tomorrow and will do glucola on tomorrow as well.  Pt scheduled appt w/ AVS @ 0830 for ROB/eval/glucola.

## 2012-04-25 ENCOUNTER — Other Ambulatory Visit: Payer: Medicaid Other

## 2012-04-25 ENCOUNTER — Encounter: Payer: Medicaid Other | Admitting: Obstetrics and Gynecology

## 2012-04-27 ENCOUNTER — Telehealth: Payer: Self-pay | Admitting: Obstetrics and Gynecology

## 2012-04-27 NOTE — Telephone Encounter (Signed)
TC from pt at 29wks w c/o muscle aches the last few days and headache, 4 other children have been sick. Temp =99, denies any cold sx's, no respiratory sx's, no nausea or vomiting. Denies any ctx, VB or LOF, +FM. Pt missed appt this week and has not been seen in 3+wks. Instructed to take tylenol 1000mg  every 4-6 hours ATC not to exceed 4000mg  in 24hr. Also may try 1 dose of motrin 800mg . Soak in epsom salt bath. Pt offered visit to [facility], declined at this time. Pt to come in if temp >100.4, dec FM, ctx, VB, persistent N/V. Will have office set up appt ASAP.

## 2012-04-28 ENCOUNTER — Telehealth: Payer: Self-pay | Admitting: Obstetrics and Gynecology

## 2012-04-28 NOTE — Telephone Encounter (Signed)
Per AF, SL left message to schedule pt appt.   TC to pt. TC to pt. LM to return call.

## 2012-04-28 NOTE — Telephone Encounter (Signed)
Returned pt's call. LM to return call.   

## 2012-04-28 NOTE — Telephone Encounter (Signed)
TC from pt. States several times feels "a whole lot better" today.  Still has some leg and abd pain but Tylenol has helped body aches.  Was able to attend class. +FM  Occ contractions. Declines appt.  To call with any concerns or if pain does not improve.  Pt verbalizes comprehension.

## 2012-05-18 ENCOUNTER — Ambulatory Visit (INDEPENDENT_AMBULATORY_CARE_PROVIDER_SITE_OTHER): Payer: Medicaid Other | Admitting: Obstetrics and Gynecology

## 2012-05-18 VITALS — BP 100/68 | Wt 155.0 lb

## 2012-05-18 DIAGNOSIS — O0930 Supervision of pregnancy with insufficient antenatal care, unspecified trimester: Secondary | ICD-10-CM

## 2012-05-18 DIAGNOSIS — Z331 Pregnant state, incidental: Secondary | ICD-10-CM

## 2012-05-18 DIAGNOSIS — B009 Herpesviral infection, unspecified: Secondary | ICD-10-CM

## 2012-05-18 LAB — HEMOGLOBIN: Hemoglobin: 10 g/dL — ABNORMAL LOW (ref 12.0–15.0)

## 2012-05-18 MED ORDER — ZOLPIDEM TARTRATE 5 MG PO TABS: 5.0000 mg | ORAL_TABLET | Freq: Every evening | ORAL | Status: DC | PRN

## 2012-05-18 NOTE — Progress Notes (Signed)
[redacted]w[redacted]d  Glucola given Pt has sciatic nerve pain both legs for awhile now  Pt has acid reflex with nausea  Pt has trouble sleeping

## 2012-05-18 NOTE — Progress Notes (Signed)
[redacted]w[redacted]d rv'd w pt keeping scheduled appointments, states she is very busy w kids, work and school 1hr gtt today Discussed sleep hygiene, has tried benadryl and unisom without benefit RX ambien given  rec cal/mag supplement for leg pain rv'd diet recommendations for GERD and rec zantac RTO 2wks

## 2012-05-19 LAB — RPR

## 2012-05-19 LAB — GLUCOSE TOLERANCE, 1 HOUR (50G) W/O FASTING: Glucose, 1 Hour GTT: 99 mg/dL (ref 70–140)

## 2012-05-19 MED ORDER — FERRALET 90 90-1 MG PO TABS: 1.0000 | ORAL_TABLET | Freq: Every day | ORAL | Status: DC

## 2012-05-19 NOTE — Addendum Note (Signed)
Addended by: Malissa Hippo. on: 05/19/2012 01:21 PM   Modules accepted: Orders

## 2012-05-20 ENCOUNTER — Other Ambulatory Visit: Payer: Self-pay | Admitting: Obstetrics and Gynecology

## 2012-05-20 ENCOUNTER — Telehealth: Payer: Self-pay | Admitting: Obstetrics and Gynecology

## 2012-05-20 NOTE — Telephone Encounter (Signed)
Message copied by Mason Jim on Fri May 20, 2012 12:12 PM ------      Message from: Malissa Hippo.      Created: Thu May 19, 2012  1:21 PM      Regarding: I sent RX for FE through epic       Please call pt to inform hgb was low, and to get FE RX            Thanks      SL                   ----- Message -----         From: [facility] Interface         Sent: 05/18/2012  10:49 PM           To: Malissa Hippo, CNM

## 2012-05-20 NOTE — Telephone Encounter (Signed)
TC to pt. Informed of low Hg results and Fe was called to pharmacy. States has been getting hot flashes, nausea and weakness daily. Advised to eat frequently including protein snacks.  To call if no improvement. Pt verbalizes comprehension.

## 2012-05-20 NOTE — Telephone Encounter (Signed)
Message copied by Mason Jim on Fri May 20, 2012 12:17 PM ------      Message from: Malissa Hippo.      Created: Thu May 19, 2012  1:21 PM      Regarding: I sent RX for FE through epic       Please call pt to inform hgb was low, and to get FE RX            Thanks      SL                   ----- Message -----         From: [facility] Interface         Sent: 05/18/2012  10:49 PM           To: Malissa Hippo, CNM

## 2012-05-20 NOTE — Telephone Encounter (Signed)
TC from pt. States Rx costs $40 and cannot afford. OTC is $20.  Requests Rx covered by insurance. To consult with SL.

## 2012-05-22 ENCOUNTER — Other Ambulatory Visit: Payer: Self-pay | Admitting: Obstetrics and Gynecology

## 2012-05-23 ENCOUNTER — Other Ambulatory Visit: Payer: Self-pay | Admitting: Obstetrics and Gynecology

## 2012-05-23 ENCOUNTER — Telehealth: Payer: Self-pay | Admitting: Obstetrics and Gynecology

## 2012-05-23 NOTE — Telephone Encounter (Signed)
Returned pt's call. Pt's VM states has been having "charlie horses" in legs all weekend. One area is now sore.  Questioning what she can do for relief. Also questioning status of Rx for Fe.

## 2012-05-23 NOTE — Telephone Encounter (Signed)
TC to [name] at [facility].  Medicaid no longer covers any  Fe Rx with or without vitamins.

## 2012-05-23 NOTE — Telephone Encounter (Signed)
Message copied by Mason Jim on Mon May 23, 2012  8:57 AM ------      Message from: Malissa Hippo.      Created: Sun May 22, 2012  3:21 PM      Regarding: RE: HG; RX NOT COVERED       Rx for integra PO 1 tab daily, disp #30, refills 2            Thanks      SL                   ----- Message -----         From: Constance Haw, RN         Sent: 05/20/2012   3:31 PM           To: Malissa Hippo, CNM      Subject: HG; RX NOT COVERED                                       RX for Fe not covered.

## 2012-05-23 NOTE — Telephone Encounter (Addendum)
LM to return call. To call if having any decrease in FM.  To return call for additional info from midwife. Pt called again  at 5:03PM. Again advised to call after hours with decrease FM, no improvement or any concerns.

## 2012-05-23 NOTE — Telephone Encounter (Signed)
Message copied by Mason Jim on Mon May 23, 2012  1:56 PM ------      Message from: Malissa Hippo.      Created: Sun May 22, 2012  3:21 PM      Regarding: RE: HG; RX NOT COVERED       Rx for integra PO 1 tab daily, disp #30, refills 2            Thanks      SL                   ----- Message -----         From: Constance Haw, RN         Sent: 05/20/2012   3:31 PM           To: Malissa Hippo, CNM      Subject: HG; RX NOT COVERED                                       RX for Fe not covered.

## 2012-05-24 NOTE — Telephone Encounter (Signed)
Late entry.  Spoke with pt 05/23/12 at 3:00. States has been having increased leg cramps. Muscles are sore in thigh and calf.  No redness or heat. States both legs swell after working on feet all day but are resolved with rest. Suggested increased water, calcium, potassium. Reviewed with [facility] who agrees with plan. LM for pt to return call to also suggest stretching exercises. Informed pt that Medicaid does not cover Rx for Fe.

## 2012-05-28 ENCOUNTER — Telehealth: Payer: Self-pay | Admitting: Obstetrics and Gynecology

## 2012-05-28 NOTE — Telephone Encounter (Signed)
TC from patient--34 weeks, with more painful contractions tonight than ever before, now q 3-8 min x 1-2 hours. Denies leaking or bleeding, reports +FM.  No dysuria, etc.  No hx PTL. Issue reviewed--recommended evaluation in MAU. Patient prefers to continue to observe at present. Recommended pushing po fluids, get in tub, take Tylenol.   If more than 4-5 UCs per hour persist needs to be seen in MAU. Patient to call back in next 1-2 hours with update.  Note:  Did not call back by 3 am.

## 2012-05-29 ENCOUNTER — Telehealth: Payer: Self-pay | Admitting: Obstetrics and Gynecology

## 2012-05-29 NOTE — Telephone Encounter (Signed)
TC from patient--34 weeks, has had HA since taking Ambien on Friday night for sleep.  No visual sx, or epigastric pain.  No significant swelling.  +FM, no contractions. Hx chronic HTN, but no meds during pregnancy. Hx chronic HAs. Hx insomnia Hasn't taken any Tylenol (only has Tylenol PM at home)  Reviewed sx and hx--recommended MAU visit for BP check, but patient declines at present. Will try Tylenol PM now, and call me with update within 2 hours. Advised patient I still will likely recommend MAU evaluation if HA persists.

## 2012-05-30 ENCOUNTER — Encounter: Payer: Medicaid Other | Admitting: Obstetrics and Gynecology

## 2012-05-30 ENCOUNTER — Telehealth: Payer: Self-pay | Admitting: Obstetrics and Gynecology

## 2012-05-30 NOTE — Telephone Encounter (Signed)
Spoke with pt per telephone call. Pt with irreg cxts since 05/27/12;ranging less than 10 min apart. Pt tried increase water intake without improvement.  Positive fetal movement. Pt with headaches and has HBP. Appt sched today with AR for eval. Pt agrees.

## 2012-05-31 ENCOUNTER — Inpatient Hospital Stay (HOSPITAL_COMMUNITY)
Admission: AD | Admit: 2012-05-31 | Discharge: 2012-05-31 | Disposition: A | Discharge status: Home / Self Care | Payer: Medicaid Other | Source: Ambulatory Visit | Attending: Obstetrics and Gynecology | Admitting: Obstetrics and Gynecology

## 2012-05-31 ENCOUNTER — Encounter (HOSPITAL_COMMUNITY): Payer: Self-pay | Admitting: *Deleted

## 2012-05-31 ENCOUNTER — Telehealth: Payer: Self-pay | Admitting: Obstetrics and Gynecology

## 2012-05-31 DIAGNOSIS — Z331 Pregnant state, incidental: Secondary | ICD-10-CM

## 2012-05-31 DIAGNOSIS — O99891 Other specified diseases and conditions complicating pregnancy: Secondary | ICD-10-CM | POA: Insufficient documentation

## 2012-05-31 DIAGNOSIS — R51 Headache: Secondary | ICD-10-CM | POA: Insufficient documentation

## 2012-05-31 DIAGNOSIS — I10 Essential (primary) hypertension: Secondary | ICD-10-CM | POA: Diagnosis present

## 2012-05-31 LAB — CBC
MCV: 80.1 fL (ref 78.0–100.0)
Platelets: 150 10*3/uL (ref 150–400)
RBC: 3.82 MIL/uL — ABNORMAL LOW (ref 3.87–5.11)
RDW: 14.1 % (ref 11.5–15.5)
WBC: 13 10*3/uL — ABNORMAL HIGH (ref 4.0–10.5)

## 2012-05-31 LAB — URINALYSIS, ROUTINE W REFLEX MICROSCOPIC
Bilirubin Urine: NEGATIVE
Glucose, UA: NEGATIVE mg/dL
Leukocytes, UA: NEGATIVE
Nitrite: NEGATIVE
Specific Gravity, Urine: 1.015 (ref 1.005–1.030)
pH: 7 (ref 5.0–8.0)

## 2012-05-31 LAB — COMPREHENSIVE METABOLIC PANEL
ALT: 10 U/L (ref 0–35)
AST: 15 U/L (ref 0–37)
Albumin: 2.7 g/dL — ABNORMAL LOW (ref 3.5–5.2)
CO2: 21 mEq/L (ref 19–32)
Chloride: 99 mEq/L (ref 96–112)
Creatinine, Ser: 0.67 mg/dL (ref 0.50–1.10)
GFR calc non Af Amer: >90 mL/min (ref >90)
Sodium: 133 mEq/L — ABNORMAL LOW (ref 135–145)
Total Bilirubin: 0.1 mg/dL — ABNORMAL LOW (ref 0.3–1.2)

## 2012-05-31 LAB — URIC ACID: Uric Acid, Serum: 4.1 mg/dL (ref 2.4–7.0)

## 2012-05-31 MED ORDER — BUTALBITAL-APAP-CAFFEINE 50-325-40 MG PO TABS: 1.0000 | ORAL_TABLET | Freq: Four times a day (QID) | ORAL | Status: DC | PRN

## 2012-05-31 MED ORDER — ACETAMINOPHEN 500 MG PO TABS
1000.0000 mg | ORAL_TABLET | ORAL | Status: AC
  Administered 2012-05-31: 1000 mg via ORAL
  Filled 2012-05-31: qty 2

## 2012-05-31 NOTE — Telephone Encounter (Signed)
Pt called, is 34w 5d, was to come in for an appt yesterday but could not make it in d/t car trouble.  Has been having a headache since Friday/Saturday and it is not going away with Tylenol.  Today started experiencing blurry vision in her right eye.  She went to [facility] to check her BP, was 115/94.  Pt does report +fm, no bldg, occasional contractions.  Pt has hx of preeclampsia and HTN.  Consulted w/ VL, pt advised to go to MAU for a PIH work up.  Pt voices agreement.  Victorino Dike, RN @ MAU made aware that pt is coming.

## 2012-05-31 NOTE — MAU Note (Signed)
HA's since Saturday, uc's also on Saturday.  HA has been ongoing.  Missed appt yesterday.  BP at [facility] today was 115/94, was advised to come to MAU.

## 2012-05-31 NOTE — MAU Provider Note (Signed)
History   26 yo [name] at 19 5/7 weeks presented after calling office c/o HA x several days, with onset of blurry vision in right eye today.  BP at local drug store was 115/94.  Denies epigastric pain, N/V, discharge, or any other issues.  Reports +FM, occasional contractions.  Previous hx of migraines.  Patient Active Problem List  Diagnosis  . History of chest pain  . Pregnant state, incidental  . Hypertension  . Herpes  . H/O varicella  . Abnormal Pap smear  . Pelvic pain  . Diastasis recti  . H/O chest pain  . Panic attack  . Right ovarian cyst  . Preeclampsia  . Anemia  . GERD  . Carpal tunnel syndrome of right wrist  . Stomach upset  . Musculoskeletal leg pain  . Prenatal care insufficient  Hx pre-eclampsia pp, then chronic hypertension--stopped labetalol in early June due to severe HAs.  Was on HCTZ until pregnant, then stopped.  No meds during pregnancy.   Chief Complaint  Patient presents with  . Headache  . Hypertension     OB History    Grav Para Term Preterm Abortions TAB SAB Ect Mult Living   6 4 4  1  1   4       Past Medical History  Diagnosis Date  . Hypertension   . Low BMI   . Postpartum hypertension   . Spotting in first trimester     With SAB  . Herpes     cold sores  . GBS carrier     First pregnancy  . H/O varicella   . Abnormal Pap smear 08/2003    colpo  . Pelvic pain     With pregnancy  . Diastasis recti 12/12/08  . Stress   . H/O chest pain 06/17/11  . Panic attack     HAS BEEN RX'D XANAX  . Right ovarian cyst 2003    RT OOPHRECTOMY  . Preeclampsia 2010,2011    POST PARTUM;WAS PUT ON BP MEDS  . Preterm contractions 2007    GIVEN BETAMETHASONE AND PROCARDIA TO STOP CTXS  . Anemia     FeSO4 SUPP IN PAST  . Headache     migraines;D/T BP  . GERD 2011    PRILOSEC TAKEN  . Carpal tunnel syndrome of right wrist     Past Surgical History  Procedure Date  . Ovary removed     right   . Dilation and curettage of uterus 2009  .  Breast cyst removal 2005    right    Family History  Problem Relation Age of Onset  . Heart disease Mother     CHF  . Hypertension Mother   . Asthma Mother     CHILDHOOD  . Diabetes Mother   . Kidney disease Mother     dialysis  . Depression Mother   . Alcohol abuse Mother   . Drug abuse Mother   . Hypertension Father   . Alcohol abuse Father   . Drug abuse Father   . Asthma Sister   . COPD Sister     chronic bronchitis  . Depression Sister     ATTEMPTED SUICIDE  . Hypertension Maternal Grandmother   . Diabetes Maternal Grandmother     History  Substance Use Topics  . Smoking status: Never Smoker   . Smokeless tobacco: Never Used  . Alcohol Use: No    Allergies: No Known Allergies  Prescriptions prior to admission  Medication Sig  Dispense Refill  . esomeprazole (NEXIUM) 40 MG packet Take 40 mg by mouth daily before breakfast.  30 each  12  . zolpidem (AMBIEN) 5 MG tablet Take 1 tablet (5 mg total) by mouth at bedtime as needed for sleep.  15 tablet  0     Physical Exam   Blood pressure 110/67, pulse 95, temperature 98.1 F (36.7 C), temperature source Oral, resp. rate 18, height 5\' 5"  (1.651 m), weight 158 lb 3.2 oz (71.759 kg), last menstrual period 10/01/2011.  Filed Vitals:   05/31/12 1618 05/31/12 1638 05/31/12 1640  BP: 113/68 125/76 110/67  Pulse: 97 101 95  Temp: 98.1 F (36.7 C)    TempSrc: Oral    Resp: 18    Height: 5\' 5"  (1.651 m)    Weight: 158 lb 3.2 oz (71.759 kg)      Chest clear  Heart RRR without murmur Abd gravid, NT Pelvic--very posterior, closed, long, vtx -2 Ext DTR 1+ without clonus, tr edema  FHR Category 1 UCs irregular, mild--not painful.  ED Course  IUP at 34 5/7 weeks Chronic hypertension--no meds during pregnancy HA  Plan: PIH labs, UA, NST Serial BPs Consider 24 hour urine. Tylenol 1000 mg now.   Nigel Bridgeman CNM, [place] 05/31/2012 4:59 PM   Addendum: Results for orders placed during the hospital  encounter of 05/31/12 (from the past 24 hour(s))  CBC     Status: Abnormal   Collection Time   05/31/12  4:04 PM      Component Value Range   WBC 13.0 (*) 4.0 - 10.5 K/uL   RBC 3.82 (*) 3.87 - 5.11 MIL/uL   Hemoglobin 9.9 (*) 12.0 - 15.0 g/dL   HCT 16.1 (*) 09.6 - 04.5 %   MCV 80.1  78.0 - 100.0 fL   MCH 25.9 (*) 26.0 - 34.0 pg   MCHC 32.4  30.0 - 36.0 g/dL   RDW 40.9  81.1 - 91.4 %   Platelets 150  150 - 400 K/uL  COMPREHENSIVE METABOLIC PANEL     Status: Abnormal   Collection Time   05/31/12  4:04 PM      Component Value Range   Sodium 133 (*) 135 - 145 mEq/L   Potassium 3.4 (*) 3.5 - 5.1 mEq/L   Chloride 99  96 - 112 mEq/L   CO2 21  19 - 32 mEq/L   Glucose, Bld 117 (*) 70 - 99 mg/dL   BUN 8  6 - 23 mg/dL   Creatinine, Ser 7.82  0.50 - 1.10 mg/dL   Calcium 9.4  8.4 - 95.6 mg/dL   Total Protein 6.1  6.0 - 8.3 g/dL   Albumin 2.7 (*) 3.5 - 5.2 g/dL   AST 15  0 - 37 U/L   ALT 10  0 - 35 U/L   Alkaline Phosphatase 85  39 - 117 U/L   Total Bilirubin 0.1 (*) 0.3 - 1.2 mg/dL   GFR calc non Af Amer >90  >90 mL/min   GFR calc Af Amer >90  >90 mL/min  LACTATE DEHYDROGENASE     Status: Normal   Collection Time   05/31/12  4:04 PM      Component Value Range   LDH 161  94 - 250 U/L  URIC ACID     Status: Normal   Collection Time   05/31/12  4:04 PM      Component Value Range   Uric Acid, Serum 4.1  2.4 - 7.0 mg/dL  URINALYSIS, ROUTINE W REFLEX MICROSCOPIC     Status: Abnormal   Collection Time   05/31/12  4:25 PM      Component Value Range   Color, Urine YELLOW  YELLOW   APPearance HAZY (*) CLEAR   Specific Gravity, Urine 1.015  1.005 - 1.030   pH 7.0  5.0 - 8.0   Glucose, UA NEGATIVE  NEGATIVE mg/dL   Hgb urine dipstick NEGATIVE  NEGATIVE   Bilirubin Urine NEGATIVE  NEGATIVE   Ketones, ur NEGATIVE  NEGATIVE mg/dL   Protein, ur NEGATIVE  NEGATIVE mg/dL   Urobilinogen, UA 0.2  0.0 - 1.0 mg/dL   Nitrite NEGATIVE  NEGATIVE   Leukocytes, UA NEGATIVE  NEGATIVE    Filed  Vitals:   05/31/12 1700 05/31/12 1710 05/31/12 1720 05/31/12 1730  BP: 107/69 113/69 112/72 115/71  Pulse: 94 97 94 91  Temp:      TempSrc:      Resp:      Height:      Weight:       No evidence elevated BP or protein in the urine.  Impression: HA, possible migraine variant  Plan: D/C home. Start 24 hour urine tomorrow, return it at scheduled office visit on Thursday. Rx Fioricet PIH and PTL precautions reviewed with patient.  Nigel Bridgeman, CNM 05/31/12 6:20pm

## 2012-06-02 ENCOUNTER — Other Ambulatory Visit: Payer: Self-pay | Admitting: Obstetrics and Gynecology

## 2012-06-02 ENCOUNTER — Telehealth: Payer: Self-pay | Admitting: Obstetrics and Gynecology

## 2012-06-02 ENCOUNTER — Ambulatory Visit (INDEPENDENT_AMBULATORY_CARE_PROVIDER_SITE_OTHER): Payer: Medicaid Other | Admitting: Obstetrics and Gynecology

## 2012-06-02 VITALS — BP 110/62 | Temp 97.0°F | Wt 156.0 lb

## 2012-06-02 DIAGNOSIS — IMO0002 Reserved for concepts with insufficient information to code with codable children: Secondary | ICD-10-CM

## 2012-06-02 DIAGNOSIS — Z331 Pregnant state, incidental: Secondary | ICD-10-CM

## 2012-06-02 NOTE — Patient Instructions (Addendum)
Over the counter generic iron sulfate 300 or 325 mg twice a day. Small box of raisins daily

## 2012-06-02 NOTE — Progress Notes (Signed)
[redacted]w[redacted]d Pt complains of being fatigue, headache and vomiting last pm and early this am. Hgb=9.  Has not purchased iron as directed.  Reinforced. Wants induction at 39 wks.  Will assess cx at that time

## 2012-06-02 NOTE — Telephone Encounter (Signed)
TC from pt c/o headache and seeing spots this evening, also feeling nauseous. Hx of headache this preg, hx pre-eclampsia. Pt is working and caring for 4 children, has not been resting, not drinking well or eating much. Rec to come to MAU for eval, pt declines at this time, has appt in the am. rv'd PIH sx's, increase rest, can take tylenol PM tonight. To come to MAU if sx's persist.

## 2012-06-06 ENCOUNTER — Other Ambulatory Visit: Payer: Self-pay

## 2012-06-06 DIAGNOSIS — I10 Essential (primary) hypertension: Secondary | ICD-10-CM

## 2012-06-06 LAB — PROTEIN, URINE, 24 HOUR: Protein, Urine: 8 mg/dL

## 2012-06-06 LAB — CREATININE, URINE, 24 HOUR
Creatinine, 24H Ur: 1329 mg/d (ref 700–1800)
Creatinine, Urine: 139.9 mg/dL

## 2012-06-06 NOTE — Addendum Note (Signed)
Addended by: Stephens Shire on: 06/06/2012 11:28 AM   Modules accepted: Orders

## 2012-06-10 ENCOUNTER — Ambulatory Visit (INDEPENDENT_AMBULATORY_CARE_PROVIDER_SITE_OTHER): Payer: Medicaid Other | Admitting: Obstetrics and Gynecology

## 2012-06-10 VITALS — BP 94/60 | Wt 162.0 lb

## 2012-06-10 DIAGNOSIS — Z331 Pregnant state, incidental: Secondary | ICD-10-CM

## 2012-06-10 NOTE — Progress Notes (Signed)
[redacted]w[redacted]d C/O: headaches, pressure, contractions last night in her back.  Pt states she lost her mucous plug  Pt noticed a yellow discharge today.  GBS done 06-02-12 :Positive  Pt desires cervix check

## 2012-06-10 NOTE — Progress Notes (Signed)
Patient ID: [name], female   DOB: May 02, 1986, 27 y.o.   MRN: [id number] [redacted]w[redacted]d Reviewed s/s uc, srom, vag bleeding, daily fetal kick counts to report, comfort measures. Encouraged 8 water daily and frequent voids. Lavera Guise, CNM

## 2012-06-14 ENCOUNTER — Telehealth: Payer: Self-pay | Admitting: Obstetrics and Gynecology

## 2012-06-14 NOTE — Telephone Encounter (Signed)
Lm on vm for pt to call back.

## 2012-06-15 NOTE — L&D Delivery Note (Signed)
Delivery Note At 7:09 PM a viable female was delivered via Vaginal, Spontaneous Delivery (Presentation: Left Occiput Anterior). Easy delivery of the head, easy delivery of the shoulders, bulb suction mouth and nares, baby placed on pt abdomen cord doubly clamped and cut by support person.  APGAR: 8, 8; weight 7 lb 2.1 oz (3235 g).   Placenta status: Intact, Spontaneous.  Cord: 3 vessels.  Anesthesia: Epidural  Episiotomy: None Lacerations: None Suture Repair: none Est. Blood Loss (mL): 300  Mom to postpartum.  Baby to rooming in. Plan for PP BTL Dr. Su Hilt will schedule.  Daire Okimoto 07/02/2012, 9:12 PM

## 2012-06-16 ENCOUNTER — Telehealth: Payer: Self-pay

## 2012-06-16 NOTE — Telephone Encounter (Signed)
Lm on vm stating that office tried to contact her regarding her message on 06/14/12 and to call back if still needing advice.

## 2012-06-20 ENCOUNTER — Ambulatory Visit (INDEPENDENT_AMBULATORY_CARE_PROVIDER_SITE_OTHER): Payer: Medicaid Other | Admitting: Obstetrics and Gynecology

## 2012-06-20 VITALS — BP 110/62 | Wt 161.0 lb

## 2012-06-20 DIAGNOSIS — Z331 Pregnant state, incidental: Secondary | ICD-10-CM

## 2012-06-20 NOTE — Progress Notes (Signed)
Pt stated no issues today. Pt wants a cervix check.  Wants induction as soon as possible because of difficulty with child care. Bishop score is 6.5 Will recheck cervix at 39 wks (06/30/12) to determine readiness

## 2012-06-21 ENCOUNTER — Telehealth: Payer: Self-pay

## 2012-06-21 NOTE — Telephone Encounter (Signed)
Pt seen 06/20/12 by [facility].

## 2012-06-21 NOTE — Telephone Encounter (Signed)
Tc to pt per [facility] recs. Pt informed her current cervical exam I will be unable to induce her at 39 weeks. Pt will have cervix checked @ her next visit on 06/27/12. Pt agrees.

## 2012-06-27 ENCOUNTER — Ambulatory Visit (INDEPENDENT_AMBULATORY_CARE_PROVIDER_SITE_OTHER): Payer: Medicaid Other | Admitting: *Deleted

## 2012-06-27 ENCOUNTER — Encounter: Payer: Medicaid Other | Admitting: *Deleted

## 2012-06-27 VITALS — BP 110/58 | Wt 162.0 lb

## 2012-06-27 DIAGNOSIS — Z331 Pregnant state, incidental: Secondary | ICD-10-CM

## 2012-06-27 NOTE — Progress Notes (Signed)
Doing well Increased drainage with no fever.  Sudafed and tylenol encouraged. Revd s/s labor, SRoM, GBS status Desires stripping of membranes at next visit.   RTC 1 week

## 2012-06-27 NOTE — Progress Notes (Signed)
Pt stated having some yellow discharge with no odor or itching.pt stated  Been having ear pain and sore throat. Pt wants a cervix check today no other issues today.

## 2012-07-01 ENCOUNTER — Ambulatory Visit: Payer: Medicaid Other | Admitting: Obstetrics and Gynecology

## 2012-07-01 ENCOUNTER — Encounter (HOSPITAL_COMMUNITY): Payer: Self-pay | Admitting: *Deleted

## 2012-07-01 ENCOUNTER — Ambulatory Visit: Payer: Medicaid Other

## 2012-07-01 ENCOUNTER — Encounter: Payer: Self-pay | Admitting: Obstetrics and Gynecology

## 2012-07-01 ENCOUNTER — Telehealth (HOSPITAL_COMMUNITY): Payer: Self-pay | Admitting: *Deleted

## 2012-07-01 VITALS — BP 120/62 | Wt 164.0 lb

## 2012-07-01 DIAGNOSIS — Z331 Pregnant state, incidental: Secondary | ICD-10-CM

## 2012-07-01 DIAGNOSIS — Z3689 Encounter for other specified antenatal screening: Secondary | ICD-10-CM

## 2012-07-01 LAB — US OB LIMITED

## 2012-07-01 NOTE — Progress Notes (Signed)
[redacted]w[redacted]d Some ctx, otherwise doing well Wants induction Bishop score 8  Cervix very stretchy, 4cm, possible hand presentation? Will confirm w [place] vtx,  Pt wants to come for induction in the AM C/w Dr Jolaine Click.Emilee Hero, CNM notified rv'd [facility] and labor sx's w pt

## 2012-07-01 NOTE — Telephone Encounter (Signed)
Preadmission screen  

## 2012-07-01 NOTE — Progress Notes (Signed)
[redacted]w[redacted]d C/o R side low back pain radiating down to back of thigh sometimes to her feet.  Pt wants membranes swept today.  Pt c/o intermittent sharp pains in vaginal area.  Presentation Ultrasound shows:              AFI: 12.59 fluid is normal 45th%tile           Cervical length: not measured            Placenta localization: fundal           Fetal presentation: vertex

## 2012-07-02 ENCOUNTER — Encounter (HOSPITAL_COMMUNITY): Payer: Self-pay | Admitting: Anesthesiology

## 2012-07-02 ENCOUNTER — Encounter (HOSPITAL_COMMUNITY): Payer: Self-pay

## 2012-07-02 ENCOUNTER — Inpatient Hospital Stay (HOSPITAL_COMMUNITY): Payer: Medicaid Other | Admitting: Anesthesiology

## 2012-07-02 ENCOUNTER — Inpatient Hospital Stay (HOSPITAL_COMMUNITY)
Admission: RE | Admit: 2012-07-02 | Discharge: 2012-07-04 | DRG: 767 | Disposition: A | Discharge status: Home / Self Care | Payer: Medicaid Other | Source: Ambulatory Visit | Attending: Obstetrics and Gynecology | Admitting: Obstetrics and Gynecology

## 2012-07-02 VITALS — BP 110/65 | HR 88 | Temp 98.3°F | Resp 18 | Ht 65.0 in | Wt 164.0 lb

## 2012-07-02 DIAGNOSIS — O1002 Pre-existing essential hypertension complicating childbirth: Secondary | ICD-10-CM | POA: Diagnosis present

## 2012-07-02 DIAGNOSIS — N83209 Unspecified ovarian cyst, unspecified side: Secondary | ICD-10-CM | POA: Diagnosis present

## 2012-07-02 DIAGNOSIS — O34599 Maternal care for other abnormalities of gravid uterus, unspecified trimester: Secondary | ICD-10-CM | POA: Diagnosis present

## 2012-07-02 DIAGNOSIS — Z22330 Carrier of Group B streptococcus: Secondary | ICD-10-CM

## 2012-07-02 DIAGNOSIS — O0930 Supervision of pregnancy with insufficient antenatal care, unspecified trimester: Secondary | ICD-10-CM

## 2012-07-02 DIAGNOSIS — Z302 Encounter for sterilization: Secondary | ICD-10-CM

## 2012-07-02 DIAGNOSIS — O99892 Other specified diseases and conditions complicating childbirth: Principal | ICD-10-CM | POA: Diagnosis present

## 2012-07-02 LAB — CBC
HCT: 32.2 % — ABNORMAL LOW (ref 36.0–46.0)
MCV: 79.3 fL (ref 78.0–100.0)
RBC: 4.06 MIL/uL (ref 3.87–5.11)
WBC: 10.6 10*3/uL — ABNORMAL HIGH (ref 4.0–10.5)

## 2012-07-02 LAB — SURGICAL PCR SCREEN: Staphylococcus aureus: NEGATIVE

## 2012-07-02 MED ORDER — LANOLIN HYDROUS EX OINT: TOPICAL_OINTMENT | CUTANEOUS | Status: DC | PRN

## 2012-07-02 MED ORDER — WITCH HAZEL-GLYCERIN EX PADS
1.0000 "application " | MEDICATED_PAD | CUTANEOUS | Status: DC | PRN
  Administered 2012-07-04: 1 via TOPICAL

## 2012-07-02 MED ORDER — LACTATED RINGERS IV SOLN
INTRAVENOUS | Status: DC
  Administered 2012-07-02 (×2): via INTRAVENOUS

## 2012-07-02 MED ORDER — ONDANSETRON HCL 4 MG/2ML IJ SOLN: 4.0000 mg | INTRAMUSCULAR | Status: DC | PRN

## 2012-07-02 MED ORDER — SODIUM BICARBONATE 8.4 % IV SOLN
INTRAVENOUS | Status: DC | PRN
  Administered 2012-07-02: 5 mL via EPIDURAL

## 2012-07-02 MED ORDER — PENICILLIN G POTASSIUM 5000000 UNITS IJ SOLR
5.0000 10*6.[IU] | Freq: Once | INTRAVENOUS | Status: AC
  Administered 2012-07-02: 5 10*6.[IU] via INTRAVENOUS
  Filled 2012-07-02: qty 5

## 2012-07-02 MED ORDER — SENNOSIDES-DOCUSATE SODIUM 8.6-50 MG PO TABS
2.0000 | ORAL_TABLET | Freq: Every day | ORAL | Status: DC
  Administered 2012-07-02 – 2012-07-03 (×2): 2 via ORAL

## 2012-07-02 MED ORDER — METOCLOPRAMIDE HCL 10 MG PO TABS
10.0000 mg | ORAL_TABLET | Freq: Once | ORAL | Status: AC
  Administered 2012-07-03: 10 mg via ORAL
  Filled 2012-07-02: qty 1

## 2012-07-02 MED ORDER — LACTATED RINGERS IV SOLN: 500.0000 mL | Freq: Once | INTRAVENOUS | Status: DC

## 2012-07-02 MED ORDER — DIPHENHYDRAMINE HCL 50 MG/ML IJ SOLN: 12.5000 mg | INTRAMUSCULAR | Status: DC | PRN

## 2012-07-02 MED ORDER — TERBUTALINE SULFATE 1 MG/ML IJ SOLN: 0.2500 mg | Freq: Once | INTRAMUSCULAR | Status: DC | PRN

## 2012-07-02 MED ORDER — PENICILLIN G POTASSIUM 5000000 UNITS IJ SOLR
2.5000 10*6.[IU] | INTRAVENOUS | Status: DC
  Administered 2012-07-02 (×2): 2.5 10*6.[IU] via INTRAVENOUS
  Filled 2012-07-02 (×5): qty 2.5

## 2012-07-02 MED ORDER — OXYCODONE-ACETAMINOPHEN 5-325 MG PO TABS
1.0000 | ORAL_TABLET | ORAL | Status: DC | PRN
  Administered 2012-07-02: 1 via ORAL
  Filled 2012-07-02: qty 1

## 2012-07-02 MED ORDER — OXYCODONE-ACETAMINOPHEN 5-325 MG PO TABS
1.0000 | ORAL_TABLET | ORAL | Status: DC | PRN
  Administered 2012-07-03 (×2): 1 via ORAL
  Administered 2012-07-03: 2 via ORAL
  Administered 2012-07-03 – 2012-07-04 (×3): 1 via ORAL
  Filled 2012-07-02 (×4): qty 1
  Filled 2012-07-02: qty 2
  Filled 2012-07-02: qty 1

## 2012-07-02 MED ORDER — LACTATED RINGERS IV SOLN: 500.0000 mL | INTRAVENOUS | Status: DC | PRN

## 2012-07-02 MED ORDER — TETANUS-DIPHTH-ACELL PERTUSSIS 5-2.5-18.5 LF-MCG/0.5 IM SUSP
0.5000 mL | Freq: Once | INTRAMUSCULAR | Status: AC
  Administered 2012-07-04: 0.5 mL via INTRAMUSCULAR
  Filled 2012-07-02: qty 0.5

## 2012-07-02 MED ORDER — ZOLPIDEM TARTRATE 5 MG PO TABS: 5.0000 mg | ORAL_TABLET | Freq: Every evening | ORAL | Status: DC | PRN

## 2012-07-02 MED ORDER — FAMOTIDINE 20 MG PO TABS
40.0000 mg | ORAL_TABLET | Freq: Once | ORAL | Status: AC
  Administered 2012-07-03: 40 mg via ORAL
  Filled 2012-07-02: qty 2

## 2012-07-02 MED ORDER — DIPHENHYDRAMINE HCL 25 MG PO CAPS: 25.0000 mg | ORAL_CAPSULE | Freq: Four times a day (QID) | ORAL | Status: DC | PRN

## 2012-07-02 MED ORDER — OXYTOCIN BOLUS FROM INFUSION
500.0000 mL | INTRAVENOUS | Status: DC
  Administered 2012-07-02: 500 mL via INTRAVENOUS

## 2012-07-02 MED ORDER — FENTANYL 2.5 MCG/ML BUPIVACAINE 1/10 % EPIDURAL INFUSION (WH - ANES)
14.0000 mL/h | INTRAMUSCULAR | Status: DC
  Administered 2012-07-02: 14 mL/h via EPIDURAL
  Filled 2012-07-02: qty 125

## 2012-07-02 MED ORDER — OXYTOCIN 40 UNITS IN LACTATED RINGERS INFUSION - SIMPLE MED
1.0000 m[IU]/min | INTRAVENOUS | Status: DC
  Administered 2012-07-02: 1 m[IU]/min via INTRAVENOUS
  Filled 2012-07-02: qty 1000

## 2012-07-02 MED ORDER — FLEET ENEMA 7-19 GM/118ML RE ENEM: 1.0000 | ENEMA | RECTAL | Status: DC | PRN

## 2012-07-02 MED ORDER — LIDOCAINE HCL (PF) 1 % IJ SOLN
30.0000 mL | INTRAMUSCULAR | Status: DC | PRN
  Filled 2012-07-02: qty 30

## 2012-07-02 MED ORDER — IBUPROFEN 600 MG PO TABS
600.0000 mg | ORAL_TABLET | Freq: Four times a day (QID) | ORAL | Status: DC
  Administered 2012-07-03 – 2012-07-04 (×5): 600 mg via ORAL
  Filled 2012-07-02 (×6): qty 1

## 2012-07-02 MED ORDER — EPHEDRINE 5 MG/ML INJ
10.0000 mg | INTRAVENOUS | Status: DC | PRN
  Filled 2012-07-02: qty 4

## 2012-07-02 MED ORDER — LACTATED RINGERS IV SOLN
INTRAVENOUS | Status: DC
  Administered 2012-07-03: 08:00:00 via INTRAVENOUS

## 2012-07-02 MED ORDER — PHENYLEPHRINE 40 MCG/ML (10ML) SYRINGE FOR IV PUSH (FOR BLOOD PRESSURE SUPPORT)
80.0000 ug | PREFILLED_SYRINGE | INTRAVENOUS | Status: DC | PRN
  Filled 2012-07-02: qty 5

## 2012-07-02 MED ORDER — OXYTOCIN 40 UNITS IN LACTATED RINGERS INFUSION - SIMPLE MED: 62.5000 mL/h | INTRAVENOUS | Status: DC

## 2012-07-02 MED ORDER — INFLUENZA VIRUS VACC SPLIT PF IM SUSP
0.5000 mL | INTRAMUSCULAR | Status: AC
  Administered 2012-07-04: 0.5 mL via INTRAMUSCULAR
  Filled 2012-07-02: qty 0.5

## 2012-07-02 MED ORDER — ACETAMINOPHEN 325 MG PO TABS: 650.0000 mg | ORAL_TABLET | ORAL | Status: DC | PRN

## 2012-07-02 MED ORDER — PRENATAL MULTIVITAMIN CH
1.0000 | ORAL_TABLET | Freq: Every day | ORAL | Status: DC
  Administered 2012-07-02 – 2012-07-04 (×3): 1 via ORAL
  Filled 2012-07-02 (×3): qty 1

## 2012-07-02 MED ORDER — EPHEDRINE 5 MG/ML INJ: 10.0000 mg | INTRAVENOUS | Status: DC | PRN

## 2012-07-02 MED ORDER — BENZOCAINE-MENTHOL 20-0.5 % EX AERO: 1.0000 "application " | INHALATION_SPRAY | CUTANEOUS | Status: DC | PRN

## 2012-07-02 MED ORDER — ONDANSETRON HCL 4 MG/2ML IJ SOLN
4.0000 mg | Freq: Four times a day (QID) | INTRAMUSCULAR | Status: DC | PRN
  Administered 2012-07-02: 4 mg via INTRAVENOUS
  Filled 2012-07-02: qty 2

## 2012-07-02 MED ORDER — PHENYLEPHRINE 40 MCG/ML (10ML) SYRINGE FOR IV PUSH (FOR BLOOD PRESSURE SUPPORT): 80.0000 ug | PREFILLED_SYRINGE | INTRAVENOUS | Status: DC | PRN

## 2012-07-02 MED ORDER — CITRIC ACID-SODIUM CITRATE 334-500 MG/5ML PO SOLN: 30.0000 mL | ORAL | Status: DC | PRN

## 2012-07-02 MED ORDER — IBUPROFEN 600 MG PO TABS
600.0000 mg | ORAL_TABLET | Freq: Four times a day (QID) | ORAL | Status: DC | PRN
  Administered 2012-07-02: 600 mg via ORAL
  Filled 2012-07-02: qty 1

## 2012-07-02 MED ORDER — DIBUCAINE 1 % RE OINT
1.0000 "application " | TOPICAL_OINTMENT | RECTAL | Status: DC | PRN
  Administered 2012-07-04: 1 via RECTAL
  Filled 2012-07-02: qty 28

## 2012-07-02 MED ORDER — ONDANSETRON HCL 4 MG PO TABS: 4.0000 mg | ORAL_TABLET | ORAL | Status: DC | PRN

## 2012-07-02 NOTE — H&P (Signed)
[name] is a 27 y.o. female, [id number] at 29 2/7 weeks, presenting for induction due to hx of precipitous delivery in past, GBS positive, and advanced cervical dilation at office visit on 07/01/12.  Denies leaking or bleeding, reports +FM. Desires BTL--consent signed 02/10/12.  Patient Active Problem List  Diagnosis  . History of chest pain  . Pregnant state, incidental  . Hypertension  . Herpes  . H/O varicella  . Abnormal Pap smear  . Pelvic pain  . Diastasis recti  . H/O chest pain  . Panic attack  . Right ovarian cyst  . Preeclampsia  . Anemia  . GERD  . Carpal tunnel syndrome of right wrist  . Stomach upset  . Musculoskeletal leg pain  . Prenatal care insufficient    History of present pregnancy: Patient entered care at 6 weeks, but had a gap in care from 26-32 weeks. EDC of 07/05/12 was established by LMP and in agreement with [place] at 6 weeks.   Anatomy scan:  18 6/7 weeks, with normal findings.   Additional [place] evaluations:  39 weeks, for verification of presentation and fluid assessment. Significant prenatal events:  Had D/C'd HCTZ with +UPT--also d/c'd Labetalol due to severe HAs, has not required meds during pregnancy.  Doppler flow studies of left leg at 26 weeks due to calf pain--WNL.  Genetic screens WNL. Last evaluation:  07/01/12--offered induction that day due to cervical dilation and hx of precipitous labor, +GBS, but elected to schedule for today.  OB History    Grav Para Term Preterm Abortions TAB SAB Ect Mult Living   6 4 4  1  1   4     2006--SVB, 41 weeks, 2 hours, 8+3, female 2007--SVB, 37 weeks 3 hour labor, 7+12, female 2009--SAB at 10 weeks 2010--SVB, 39 2/7 weeks, 1 hr 22 min labor, 7+9, female, with [facility] 2011--SVB, 39 1/7 weeks, 3 h 22 min labor, 6+8, female, with [facility]--CAN x 2, decreased BP  Past Medical History  Diagnosis Date  . Hypertension   . Low BMI   . Postpartum hypertension   . Spotting in first trimester     With SAB  . Herpes    cold sores  . GBS carrier     First pregnancy  . H/O varicella   . Abnormal Pap smear 08/2003    colpo  . Pelvic pain     With pregnancy  . Diastasis recti 12/12/08  . Stress   . H/O chest pain 06/17/11  . Panic attack     HAS BEEN RX'D XANAX  . Right ovarian cyst 2003    RT OOPHRECTOMY  . Preeclampsia 2010,2011    POST PARTUM;WAS PUT ON BP MEDS  . Preterm contractions 2007    GIVEN BETAMETHASONE AND PROCARDIA TO STOP CTXS  . Anemia     FeSO4 SUPP IN PAST  . Headache     migraines;D/T BP  . GERD 2011    PRILOSEC TAKEN  . Carpal tunnel syndrome of right wrist    Past Surgical History  Procedure Date  . Ovary removed     right   . Dilation and curettage of uterus 2009  . Breast cyst removal 2005    right   Family History: family history includes Alcohol abuse in her father and mother; Asthma in her mother and sister; COPD in her sister; Depression in her mother and sister; Diabetes in her maternal grandmother and mother; Drug abuse in her father and mother; Heart disease in  her mother; Hypertension in her father, maternal grandmother, and mother; and Kidney disease in her mother.  Social History:  reports that she has never smoked. She has never used smokeless tobacco. She reports that she does not drink alcohol or use illicit drugs.  FOB is involved and supportive--same partner as previous children--Taraus Wiemann.  Patient is not currently employed.   Prenatal Transfer Tool  Maternal Diabetes: No Genetic Screening: Normal Maternal Ultrasounds/Referrals: Normal Fetal Ultrasounds or other Referrals:  None Maternal Substance Abuse:  No Significant Maternal Medications:  None Significant Maternal Lab Results: Lab values include: Group B Strep positive    ROS:  Occasional contractions, +FM.  No Known Allergies     Last menstrual period 10/01/2011.  Chest clear Heart RRR without murmur Abd gravid, NT, FH 39 cm Pelvic: Cervix very posterior, 3-4 cm, 70%, vtx,  -1 Ext: WNL  FHR: Category 1 UCs:  None  Prenatal labs: ABO, Rh: O/POS/-- (06/25 1610) Antibody: NEG (06/25 1610) Rubella:   Immune RPR: NON REAC (12/04 1644)  HBsAg: NEGATIVE (06/25 1610)  HIV: NON REACTIVE (06/25 1610)  GBS: POSITIVE (12/19 1157) Sickle cell/Hgb electrophoresis:  Negative Pap:  Due 1/14 GC:  Negative 12/14/11 Chlamydia: Negative 12/14/11 Genetic screenings:  Normal 1st trimester screen and AFP Glucola:  WNL Other:  PIH labs and 24 hour urine WNL on 06/02/12.       Assessment/Plan: IUP at 39 2/7 weeks Hx precipitous labor/delivery GBS positive Favorable cervix Chronic hypertension--no meds required during pregnancy Desires BTL  Plan: Admit to Birthing Suite per consult with Dr. Su Hilt Routine [facility] orders Plan initiation of GBS prophylaxis with PCN G. Pitocin induction per low dose protocol. Pain medication/epidural prn. Plan BTL pp.  Hilman Kissling, VICKICNM, [place] 07/02/2012, 7:27 AM

## 2012-07-02 NOTE — Anesthesia Procedure Notes (Signed)

## 2012-07-02 NOTE — Progress Notes (Signed)
  Subjective: Comfortable with epidural.  Objective: BP 123/82  Pulse 77  Temp 98 F (36.7 C) (Oral)  Resp 18  Ht 5\' 5"  (1.651 m)  Wt 164 lb (74.39 kg)  BMI 27.29 kg/m2  SpO2 100%  LMP 10/01/2011      FHT: Category 1 UC:   regular, every 3-4 minutes SVE:   Dilation: 7 Effacement (%): 70 Station: -2 Exam by:: Norie Latendresse AROM, clear fluid  Assessment / Plan: Will CTO--await further progress. Nigel Bridgeman 07/02/2012, 4:50 PM

## 2012-07-02 NOTE — Progress Notes (Signed)
  Subjective: Feeling some pressure.  Aware of contractions  Objective: BP 109/70  Pulse 78  Temp 97.7 F (36.5 C) (Oral)  Resp 18  Ht 5\' 5"  (1.651 m)  Wt 164 lb (74.39 kg)  BMI 27.29 kg/m2  SpO2 100%  LMP 10/01/2011      FHT:  Category 1 UC:   regular, every 2-5 minutes SVE:   Dilation: 7 Effacement (%): 80 Station: -1 Exam by:: Shanicka Oldenkamp Cervix more anterior.  Assessment / Plan: Progressive labor Will await further dilation. Nigel Bridgeman 07/02/2012, 5:55 PM

## 2012-07-02 NOTE — Progress Notes (Signed)
  Subjective: Aware of some contractions, but not uncomfortable.  Up to chair at bedside.  Objective: BP 125/68  Pulse 101  Temp 97.8 F (36.6 C) (Oral)  Resp 18  Ht 5\' 5"  (1.651 m)  Wt 164 lb (74.39 kg)  BMI 27.29 kg/m2  LMP 10/01/2011      FHT:  Category 1 UC:   irregular, every 4 minutes Pitocin on 7 mu/min  Assessment / Plan: Induction of labor. Will continue pitocin--patient prefers to plan epidural before AROM.  Nigel Bridgeman 07/02/2012, 12:44 PM

## 2012-07-02 NOTE — Anesthesia Preprocedure Evaluation (Signed)
Anesthesia Evaluation  Patient identified by MRN, date of birth, ID band Patient awake    Reviewed: Allergy & Precautions, H&P , Patient's Chart, lab work & pertinent test results  Airway Mallampati: II  TM Distance: >3 FB Neck ROM: full    Dental  (+) Teeth Intact   Pulmonary    breath sounds clear to auscultation       Cardiovascular  Rhythm:regular Rate:Normal     Neuro/Psych    GI/Hepatic GERD  Medicated,  Endo/Other    Renal/GU      Musculoskeletal   Abdominal   Peds  Hematology   Anesthesia Other Findings       Reproductive/Obstetrics (+) Pregnancy                             Anesthesia Physical Anesthesia Plan  ASA: II  Anesthesia Plan: Epidural   Post-op Pain Management:    Induction:   Airway Management Planned:   Additional Equipment:   Intra-op Plan:   Post-operative Plan:   Informed Consent: I have reviewed the patients History and Physical, chart, labs and discussed the procedure including the risks, benefits and alternatives for the proposed anesthesia with the patient or authorized representative who has indicated his/her understanding and acceptance.   Dental Advisory Given  Plan Discussed with:   Anesthesia Plan Comments: (Labs checked- platelets confirmed with RN in room. Fetal heart tracing, per RN, reported to be stable enough for sitting procedure. Discussed epidural, and patient consents to the procedure:  included risk of possible headache,backache, failed block, allergic reaction, and nerve injury. This patient was asked if she had any questions or concerns before the procedure started.)        Anesthesia Quick Evaluation  

## 2012-07-02 NOTE — Progress Notes (Signed)
  Subjective: More aware of contractions now, but not very uncomfortable yet.  Considering timing of epidural.  Objective: BP 118/68  Pulse 96  Temp 98 F (36.7 C) (Oral)  Resp 18  Ht 5\' 5"  (1.651 m)  Wt 164 lb (74.39 kg)  BMI 27.29 kg/m2  LMP 10/01/2011      FHT:  Category 1 UC:   Regular q 4 min, mild/moderate SVE:   Dilation: 4.5 Effacement (%): 70 Station: -2 Exam by:: Emilee Hero Cervix less posterior now  Assessment / Plan: Progressive labor Will observe contraction status for another 30 min--if continue to increase in quality, will proceed with epidural, then AROM.  Nigel Bridgeman 07/02/2012, 1:53 PM

## 2012-07-02 NOTE — Progress Notes (Signed)
  Subjective: Comfortable--not aware of contractions.  Objective: BP 91/46  Pulse 98  Temp 98 F (36.7 C) (Oral)  Resp 18  Ht 5\' 5"  (1.651 m)  Wt 164 lb (74.39 kg)  BMI 27.29 kg/m2  LMP 10/01/2011      FHT:  Category 1 UC:   irregular, every 3-6 minutes 1st dose ATB infusing. Pitocin initiated--holding at 1-2 mu until ATB infused. Allowed light meal before pitocin begun.  Assessment / Plan: Induction of labor due to hx precipitous labor/delivery, +GBS, favorable cervix Will await completion of 1st ATB dose before beginning to increase pitocin per protocol.   Nigel Bridgeman 07/02/2012, 10:19 AM

## 2012-07-03 ENCOUNTER — Encounter (HOSPITAL_COMMUNITY)
Admission: RE | Disposition: A | Discharge status: Home / Self Care | Payer: Self-pay | Source: Ambulatory Visit | Attending: Obstetrics and Gynecology

## 2012-07-03 ENCOUNTER — Encounter (HOSPITAL_COMMUNITY): Payer: Self-pay

## 2012-07-03 ENCOUNTER — Inpatient Hospital Stay (HOSPITAL_COMMUNITY): Payer: Medicaid Other

## 2012-07-03 HISTORY — PX: TUBAL LIGATION: SHX77

## 2012-07-03 LAB — CBC
HCT: 28.2 % — ABNORMAL LOW (ref 36.0–46.0)
MCHC: 32.3 g/dL (ref 30.0–36.0)
Platelets: 134 10*3/uL — ABNORMAL LOW (ref 150–400)
RDW: 14.7 % (ref 11.5–15.5)

## 2012-07-03 SURGERY — LIGATION, FALLOPIAN TUBE, POSTPARTUM
Anesthesia: Epidural | Site: Abdomen | Laterality: Bilateral | Wound class: Clean Contaminated

## 2012-07-03 MED ORDER — PROMETHAZINE HCL 25 MG/ML IJ SOLN: 6.2500 mg | INTRAMUSCULAR | Status: DC | PRN

## 2012-07-03 MED ORDER — MIDAZOLAM HCL 5 MG/5ML IJ SOLN
INTRAMUSCULAR | Status: DC | PRN
  Administered 2012-07-03 (×2): 1 mg via INTRAVENOUS

## 2012-07-03 MED ORDER — SODIUM BICARBONATE 8.4 % IV SOLN
INTRAVENOUS | Status: AC
  Filled 2012-07-03: qty 50

## 2012-07-03 MED ORDER — POLYSACCHARIDE IRON COMPLEX 150 MG PO CAPS
150.0000 mg | ORAL_CAPSULE | Freq: Every day | ORAL | Status: DC
  Administered 2012-07-03 – 2012-07-04 (×2): 150 mg via ORAL
  Filled 2012-07-03 (×3): qty 1

## 2012-07-03 MED ORDER — LIDOCAINE HCL (CARDIAC) 20 MG/ML IV SOLN
INTRAVENOUS | Status: AC
  Filled 2012-07-03: qty 5

## 2012-07-03 MED ORDER — MEPERIDINE HCL 25 MG/ML IJ SOLN: 6.2500 mg | INTRAMUSCULAR | Status: DC | PRN

## 2012-07-03 MED ORDER — BUPIVACAINE HCL (PF) 0.25 % IJ SOLN
INTRAMUSCULAR | Status: DC | PRN
  Administered 2012-07-03: 10 mL

## 2012-07-03 MED ORDER — METOCLOPRAMIDE HCL 5 MG/ML IJ SOLN
INTRAMUSCULAR | Status: AC
  Filled 2012-07-03: qty 2

## 2012-07-03 MED ORDER — ONDANSETRON HCL 4 MG/2ML IJ SOLN
INTRAMUSCULAR | Status: DC | PRN
  Administered 2012-07-03: 4 mg via INTRAVENOUS

## 2012-07-03 MED ORDER — SODIUM CHLORIDE 0.9 % IR SOLN
Status: DC | PRN
  Administered 2012-07-03: 1000 mL

## 2012-07-03 MED ORDER — METOCLOPRAMIDE HCL 5 MG/ML IJ SOLN
INTRAMUSCULAR | Status: DC | PRN
  Administered 2012-07-03: 10 mg via INTRAVENOUS

## 2012-07-03 MED ORDER — LIDOCAINE HCL (CARDIAC) 20 MG/ML IV SOLN
INTRAVENOUS | Status: DC | PRN
  Administered 2012-07-03: 10 mg via INTRAVENOUS

## 2012-07-03 MED ORDER — DEXAMETHASONE SODIUM PHOSPHATE 10 MG/ML IJ SOLN
INTRAMUSCULAR | Status: AC
  Filled 2012-07-03: qty 1

## 2012-07-03 MED ORDER — SODIUM BICARBONATE 8.4 % IV SOLN
INTRAVENOUS | Status: DC | PRN
  Administered 2012-07-03: 3 mL via EPIDURAL

## 2012-07-03 MED ORDER — LIDOCAINE-EPINEPHRINE (PF) 2 %-1:200000 IJ SOLN
INTRAMUSCULAR | Status: AC
  Filled 2012-07-03: qty 20

## 2012-07-03 MED ORDER — HYDROMORPHONE HCL PF 1 MG/ML IJ SOLN: 0.2500 mg | INTRAMUSCULAR | Status: DC | PRN

## 2012-07-03 MED ORDER — ONDANSETRON HCL 4 MG/2ML IJ SOLN
INTRAMUSCULAR | Status: AC
  Filled 2012-07-03: qty 2

## 2012-07-03 MED ORDER — PROPOFOL 10 MG/ML IV BOLUS
INTRAVENOUS | Status: DC | PRN
  Administered 2012-07-03 (×11): 5 mg via INTRAVENOUS

## 2012-07-03 MED ORDER — FENTANYL CITRATE 0.05 MG/ML IJ SOLN
INTRAMUSCULAR | Status: AC
  Filled 2012-07-03: qty 2

## 2012-07-03 MED ORDER — DEXAMETHASONE SODIUM PHOSPHATE 10 MG/ML IJ SOLN
INTRAMUSCULAR | Status: DC | PRN
  Administered 2012-07-03: 10 mg via INTRAVENOUS

## 2012-07-03 MED ORDER — KETOROLAC TROMETHAMINE 30 MG/ML IJ SOLN: 15.0000 mg | Freq: Once | INTRAMUSCULAR | Status: AC | PRN

## 2012-07-03 MED ORDER — FENTANYL CITRATE 0.05 MG/ML IJ SOLN
INTRAMUSCULAR | Status: DC | PRN
  Administered 2012-07-03 (×2): 50 ug via INTRAVENOUS

## 2012-07-03 MED ORDER — BUPIVACAINE HCL (PF) 0.25 % IJ SOLN
INTRAMUSCULAR | Status: AC
  Filled 2012-07-03: qty 30

## 2012-07-03 MED ORDER — MIDAZOLAM HCL 2 MG/2ML IJ SOLN
INTRAMUSCULAR | Status: AC
  Filled 2012-07-03: qty 2

## 2012-07-03 MED ORDER — LACTATED RINGERS IV SOLN
INTRAVENOUS | Status: DC | PRN
  Administered 2012-07-03 (×2): via INTRAVENOUS

## 2012-07-03 MED ORDER — PROPOFOL 10 MG/ML IV EMUL
INTRAVENOUS | Status: AC
  Filled 2012-07-03: qty 20

## 2012-07-03 SURGICAL SUPPLY — 32 items
ADH SKN CLS APL DERMABOND .7 (GAUZE/BANDAGES/DRESSINGS) ×1
ADH SKN CLS LQ APL DERMABOND (GAUZE/BANDAGES/DRESSINGS) ×1
APL SKNCLS STERI-STRIP NONHPOA (GAUZE/BANDAGES/DRESSINGS)
BENZOIN TINCTURE PRP APPL 2/3 (GAUZE/BANDAGES/DRESSINGS) IMPLANT
CHLORAPREP W/TINT 26ML (MISCELLANEOUS) ×2 IMPLANT
CLOTH BEACON ORANGE TIMEOUT ST (SAFETY) ×2 IMPLANT
CONTAINER PREFILL 10% NBF 15ML (MISCELLANEOUS) ×4 IMPLANT
DERMABOND ADHESIVE PROPEN (GAUZE/BANDAGES/DRESSINGS) ×1
DERMABOND ADVANCED (GAUZE/BANDAGES/DRESSINGS) ×1
DERMABOND ADVANCED .7 DNX12 (GAUZE/BANDAGES/DRESSINGS) IMPLANT
DERMABOND ADVANCED .7 DNX6 (GAUZE/BANDAGES/DRESSINGS) IMPLANT
ELECT REM PT RETURN 9FT ADLT (ELECTROSURGICAL) ×2
ELECTRODE REM PT RTRN 9FT ADLT (ELECTROSURGICAL) ×1 IMPLANT
GLOVE BIO SURGEON STRL SZ7.5 (GLOVE) ×4 IMPLANT
GLOVE BIOGEL PI IND STRL 7.5 (GLOVE) ×1 IMPLANT
GLOVE BIOGEL PI INDICATOR 7.5 (GLOVE) ×1
GOWN PREVENTION PLUS LG XLONG (DISPOSABLE) ×2 IMPLANT
NEEDLE HYPO 25X1 1.5 SAFETY (NEEDLE) ×2 IMPLANT
NS IRRIG 1000ML POUR BTL (IV SOLUTION) ×2 IMPLANT
PACK ABDOMINAL MINOR (CUSTOM PROCEDURE TRAY) ×2 IMPLANT
PENCIL BUTTON HOLSTER BLD 10FT (ELECTRODE) ×2 IMPLANT
SPONGE LAP 4X18 X RAY DECT (DISPOSABLE) ×1 IMPLANT
STRIP CLOSURE SKIN 1/4X3 (GAUZE/BANDAGES/DRESSINGS) IMPLANT
SUT MNCRL AB 3-0 PS2 27 (SUTURE) ×2 IMPLANT
SUT MON AB 3-0 SH 27 (SUTURE) ×2
SUT MON AB 3-0 SH27 (SUTURE) IMPLANT
SUT PLAIN 0 NONE (SUTURE) ×2 IMPLANT
SUT VIC AB 0 CT1 36 (SUTURE) ×3 IMPLANT
SYR CONTROL 10ML LL (SYRINGE) ×1 IMPLANT
TOWEL OR 17X24 6PK STRL BLUE (TOWEL DISPOSABLE) ×4 IMPLANT
TRAY FOLEY CATH 14FR (SET/KITS/TRAYS/PACK) ×2 IMPLANT
WATER STERILE IRR 1000ML POUR (IV SOLUTION) IMPLANT

## 2012-07-03 NOTE — Anesthesia Postprocedure Evaluation (Signed)
Anesthesia Post Note  Patient: [name]  Procedure(s) Performed: * No procedures listed *  Anesthesia type: Epidural  Patient location: Mother/Baby  Post pain: Pain level controlled  Post assessment: Post-op Vital signs reviewed  Last Vitals:  Filed Vitals:   07/03/12 1407  BP: 116/75  Pulse: 93  Temp: 36.7 C  Resp: 16    Post vital signs: Reviewed  Level of consciousness:alert  Complications: No apparent anesthesia complications

## 2012-07-03 NOTE — Addendum Note (Signed)
Addendum  created 07/03/12 1552 by Briellah Baik Draughon Nemiah Bubar, CRNA   Modules edited:Charges VN, Notes Section    

## 2012-07-03 NOTE — Anesthesia Preprocedure Evaluation (Addendum)
Anesthesia Evaluation  Patient identified by MRN, date of birth, ID band Patient awake    Reviewed: Allergy & Precautions, H&P , Patient's Chart, lab work & pertinent test results  Airway Mallampati: II TM Distance: >3 FB Neck ROM: full    Dental  (+) Teeth Intact   Pulmonary  breath sounds clear to auscultation        Cardiovascular Rhythm:regular Rate:Normal     Neuro/Psych    GI/Hepatic GERD-  Medicated,  Endo/Other    Renal/GU      Musculoskeletal   Abdominal   Peds  Hematology   Anesthesia Other Findings       Reproductive/Obstetrics (+) Pregnancy                           Anesthesia Physical  Anesthesia Plan  ASA: II  Anesthesia Plan: Epidural   Post-op Pain Management:    Induction:   Airway Management Planned:   Additional Equipment:   Intra-op Plan:   Post-operative Plan:   Informed Consent: I have reviewed the patients History and Physical, chart, labs and discussed the procedure including the risks, benefits and alternatives for the proposed anesthesia with the patient or authorized representative who has indicated his/her understanding and acceptance.   Dental Advisory Given  Plan Discussed with:   Anesthesia Plan Comments: (Labs checked- platelets confirmed with RN in room. Fetal heart tracing, per RN, reported to be stable enough for sitting procedure. Discussed epidural, and patient consents to the procedure:  included risk of possible headache,backache, failed block, allergic reaction, and nerve injury. This patient was asked if she had any questions or concerns before the procedure started. Using the above epidural for PPTL today.)       Anesthesia Quick Evaluation

## 2012-07-03 NOTE — Addendum Note (Signed)
Addendum  created 07/03/12 1552 by Lincoln Brigham, CRNA   Modules edited:Charges VN, Notes Section

## 2012-07-03 NOTE — Addendum Note (Signed)
Addendum  created 07/03/12 1553 by Lincoln Brigham, CRNA   Modules edited:Notes Section

## 2012-07-03 NOTE — Transfer of Care (Signed)
Immediate Anesthesia Transfer of Care Note  Patient: [name]  Procedure(s) Performed: Procedure(s) (LRB) with comments: POST PARTUM TUBAL LIGATION (Bilateral) - Bilateral post partum tubal ligation  Patient Location: PACU  Anesthesia Type:Epidural  Level of Consciousness: awake, alert  and oriented  Airway & Oxygen Therapy: Patient Spontanous Breathing  Post-op Assessment: Report given to PACU RN and Post -op Vital signs reviewed and stable  Post vital signs: Reviewed and stable  Complications: No apparent anesthesia complications

## 2012-07-03 NOTE — Anesthesia Postprocedure Evaluation (Signed)
  Anesthesia Post-op Note  Patient: [name] For PPTL......Marland Kitchen Keep epidural

## 2012-07-03 NOTE — Op Note (Addendum)
Indication: Pt would like permanent sterilization.  She is currently s/p delivery of her 5th child.  The risks, benefits and alternatives were explained and consent signed and witnessed.  Pt did not have any questions.  Preop Diagnosis: Desires Permanent Sterilization   Postop Diagnosis: Desires Permanent Sterilization   Procedure: POST PARTUM RIGHT SALPINGECTOMY and LEFT TUBAL LIGATION   Anesthesia: Epidural   Anesthesiologist: Sandrea Hughs., MD   Attending: Purcell Nails, MD   Assistant: surgical tech  Findings: normal appearing bilateral ovaries and fallopian tubes.  Pathology: portions of bilateral fallopian tubes  Fluids:  1100cc  UOP: 600cc  EBL: Minimal  Complications: None  Procedure: The patient was taken to the operating room after the risks, benefits, alternative, complications, treatment options, and expected outcomes were discussed with the patient. The patient verbalized understanding, the patient concurred with the proposed plan and consent signed and witnessed. The patient was taken to the Operating Room, identified as [name] and the procedure verified as bilateral tubal ligation. A Time Out was held and the above information confirmed.  The patient was given a surgical level via the epidural and prepped, draped, and catheterized in the normal, sterile fashion in the supine position.  A 10mm incision was made at the umbilicus and carried down to the underlying layer of fascia and peritoneum entered without difficulty.  The right fallopian tube was indentified and carried out to its fimbriated end.  It was noted to have multiple adhesions surrounding tube and ovary on that side was not visualized (pt had prior rt oophorectomy).  The majority of the tube was then clamped cut and suture ligated with 0 vicryl, tied with 2-0 plain and suture ligated with 0 vicryl again.  The left fallopian tube was identified and attempted to be carried out to  its fimbriated end but there where several adhesions from the ovary to the distal portion of the tube.  The fimbria was actually unable to be visualized for this reason.  The tube was ligated twice with 2-0 plain and suture ligated with 0 vicryl as well.  The mesosalpinx was noted to be oozing and large vessels were noted in the remaining pedicle.  The vessels that were noted on the remaining pedicle were cauterized to ensure hemostasis.  The mesosalpinx was noted to still be a little oozy at the edges so the edges were reapproximated with 3-0 monocryl.  The pedicles were reinspected and noted to be hemostatic.  The skin was noted to have a possible slight burn approx 1/2 cm below the lower edge of the incision which was excised.  The skin was reapproximated using 3-0 monocryl and dermabond applied.  Instrument, sponge, lap and needle count was correct.  Patient tolerated the procedure well and was returned to the recovery room in good condition.

## 2012-07-03 NOTE — Anesthesia Postprocedure Evaluation (Signed)
  Anesthesia Post Note  Patient: [name]  Procedure(s) Performed: Procedure(s) (LRB): POST PARTUM TUBAL LIGATION (Bilateral)  Anesthesia type: Epidural  Patient location: Mother/Baby  Post pain: Pain level controlled  Post assessment: Post-op Vital signs reviewed  Last Vitals:  Filed Vitals:   07/03/12 1407  BP: 116/75  Pulse: 93  Temp: 36.7 C  Resp: 16    Post vital signs: Reviewed  Level of consciousness:alert  Complications: No apparent anesthesia complications

## 2012-07-03 NOTE — Anesthesia Postprocedure Evaluation (Signed)
Anesthesia Post Note  Patient: [name]  Procedure(s) Performed: Procedure(s) (LRB): POST PARTUM TUBAL LIGATION (Bilateral)  Anesthesia type: Epidural  Patient location: PACU  Post pain: Pain level controlled  Post assessment: Post-op Vital signs reviewed  Last Vitals:  Filed Vitals:   07/03/12 1145  BP: 114/71  Pulse: 74  Temp:   Resp: 16    Post vital signs: Reviewed  Level of consciousness: awake  Complications: No apparent anesthesia complications

## 2012-07-04 ENCOUNTER — Encounter (HOSPITAL_COMMUNITY): Payer: Self-pay | Admitting: Obstetrics and Gynecology

## 2012-07-04 MED ORDER — SIMETHICONE 80 MG PO CHEW
80.0000 mg | CHEWABLE_TABLET | Freq: Four times a day (QID) | ORAL | Status: DC | PRN
  Administered 2012-07-04 (×2): 80 mg via ORAL

## 2012-07-04 MED ORDER — IBUPROFEN 600 MG PO TABS: 600.0000 mg | ORAL_TABLET | Freq: Four times a day (QID) | ORAL | Status: DC

## 2012-07-04 MED ORDER — GLYCERIN (LAXATIVE) 2.1 G RE SUPP
1.0000 | RECTAL | Status: DC | PRN
  Administered 2012-07-04: 1 via RECTAL
  Filled 2012-07-04: qty 1

## 2012-07-04 MED ORDER — OXYCODONE-ACETAMINOPHEN 5-325 MG PO TABS: 1.0000 | ORAL_TABLET | ORAL | Status: DC | PRN

## 2012-07-04 NOTE — Progress Notes (Signed)
Post Partum Day 2  PPBTL #1 Subjective:  Well. Lochia are normal. Voiding, ambulating, tolerating normal diet. nursing going well. Bothered by gas. No BM  Objective: Blood pressure 110/65, pulse 88, temperature 98.3 F (36.8 C), temperature source Oral, resp. rate 18, height 5\' 5"  (1.651 m), weight 164 lb (74.39 kg), last menstrual period 10/01/2011, SpO2 96.00%, unknown if currently breastfeeding.  Physical Exam:  General: normal Lochia: appropriate Uterine Fundus: 0/1 firm non-tender Umbilical incision: normal  Extremities: No evidence of DVT seen on physical exam. Edema :none     Basename 07/03/12 0645 07/02/12 0755  HGB 9.1* 10.4*  HCT 28.2* 32.2*    Assessment/Plan: Normal Post-partum. Continue routine post-partum care. Discharge today Instructions reviewed    LOS: 2 days   Josanne Boerema A MD 07/04/2012, 10:29 AM

## 2012-07-04 NOTE — Discharge Summary (Signed)
  Obstetric Discharge Summary  Reason for Admission: induction of labor  due to hx of precipitous delivery in past, GBS positive, and advanced cervical dilation at office visit on 07/01/12 Prenatal Procedures: none Intrapartum Procedures: spontaneous vaginal delivery by Lavera Guise CNM Postpartum Procedures: P.P. tubal ligation 07/03/12 Complications-Operative and Postpartum: none  Hemoglobin  Date Value Range Status  07/03/2012 9.1* 12.0 - 15.0 g/dL Final     HCT  Date Value Range Status  07/03/2012 28.2* 36.0 - 46.0 % Final    Discharge Diagnoses: Term Pregnancy-delivered  Discharge Information:  Date: 07/04/2012 Activity: unrestricted Diet: routine Medications: Ibuprofen, Colace and Percocet Condition: stable  Breastfeeding: yes  Instructions: refer to practice specific booklet Discharge to: home   Newborn Data: Live born  Information for the patient's newborn:  Shanecia, Hoganson [[id number]]  female   Home with mother.  Korion Cuevas A MD 07/04/2012, 10:26 AM

## 2012-07-04 NOTE — Progress Notes (Signed)
Ur chart review completed.  

## 2012-07-04 NOTE — Progress Notes (Signed)
Pt discharged before CSW could assess history of panic attacks.

## 2012-07-08 ENCOUNTER — Telehealth: Payer: Self-pay | Admitting: Obstetrics and Gynecology

## 2012-07-08 ENCOUNTER — Encounter: Payer: Medicaid Other | Admitting: Obstetrics and Gynecology

## 2012-07-08 NOTE — Telephone Encounter (Signed)
Call from patient reporting right-sided headache and chest heaviness when lying down. Says she has experienced that after her last 2 babies and was given Labetalol 200 mg BID and HCTZ which she took all the way to this pregnancy and discontinued early on. Did not require any antihypertensive this pregnancy. D/C from hospital 07/04/12. Is 6 days post-partum. Wants to resume her meds. Instructed to come to [facility]U to be evaluated. Says it is difficult with 6 children, a breastfed baby and a husband going to work at 10 pm tonight. Encouraged to find an adult to accompany her to the hospital. Pt voiced understanding and will try to come in.

## 2012-07-11 ENCOUNTER — Other Ambulatory Visit (HOSPITAL_COMMUNITY): Payer: Self-pay | Admitting: Cardiovascular Disease

## 2012-07-11 ENCOUNTER — Encounter: Payer: Self-pay | Admitting: Obstetrics and Gynecology

## 2012-07-11 ENCOUNTER — Telehealth: Payer: Self-pay | Admitting: Obstetrics and Gynecology

## 2012-07-11 ENCOUNTER — Ambulatory Visit: Payer: Medicaid Other | Admitting: Obstetrics and Gynecology

## 2012-07-11 VITALS — BP 118/78 | Wt 150.0 lb

## 2012-07-11 DIAGNOSIS — I10 Essential (primary) hypertension: Secondary | ICD-10-CM

## 2012-07-11 DIAGNOSIS — O139 Gestational [pregnancy-induced] hypertension without significant proteinuria, unspecified trimester: Secondary | ICD-10-CM

## 2012-07-11 DIAGNOSIS — I519 Heart disease, unspecified: Secondary | ICD-10-CM

## 2012-07-11 DIAGNOSIS — I313 Pericardial effusion (noninflammatory): Secondary | ICD-10-CM

## 2012-07-11 LAB — COMPREHENSIVE METABOLIC PANEL
CO2: 24 mEq/L (ref 19–32)
Calcium: 9.3 mg/dL (ref 8.4–10.5)
Creat: 0.72 mg/dL (ref 0.50–1.10)
Glucose, Bld: 83 mg/dL (ref 70–99)
Sodium: 139 mEq/L (ref 135–145)
Total Bilirubin: 0.2 mg/dL — ABNORMAL LOW (ref 0.3–1.2)
Total Protein: 6.4 g/dL (ref 6.0–8.3)

## 2012-07-11 LAB — CBC
HCT: 35 % — ABNORMAL LOW (ref 36.0–46.0)
MCHC: 33.4 g/dL (ref 30.0–36.0)
MCV: 77.8 fL — ABNORMAL LOW (ref 78.0–100.0)
RDW: 13.9 % (ref 11.5–15.5)

## 2012-07-11 NOTE — Progress Notes (Signed)
Date of delivery: 07/02/2012 Female Name: [name] Vaginal delivery:yes Cesarean section:no Tubal ligation:yes GDM:no Breast Feeding:yes Bottle Feeding:no Post-Partum Blues:no Abnormal pap:yes Normal GU function: yes Normal GI function:yes Returning to work:no Pt stated she been having headaches and left side back and front chest pain. Only when pt is laying down.

## 2012-07-11 NOTE — Telephone Encounter (Signed)
Pt called states had spoken w/ SR on Friday about her c/o chest pain and was instructed to come to MAU.  Pt says was unable to go d/t no child care and is breatfeeding baby and has no pump or formula.  Pt continues to c/o chest heaviness when she is laying down, is unable to get comfortable and is having pain in her shoulder as well.  Does not have the pain when she is sitting up.  Pt has not had any issues w/ HTN during her last pregnancy.  Consulted w/ [facility], pt to come in for [facility] labs and have an evaluation w/ SL, pt voices agreement.

## 2012-07-11 NOTE — Progress Notes (Addendum)
Pt had SVD about 6 days ago without any complications.  This is her 5th child and had a bilateral tubal ligation postpartum.  She was dc'd home on postpartum day 2 in stable condition, she remained normotensive and was normotensive during her pregnancy.   Today she c/o chest heaviness and difficulty breathing when she is laying down, she states she feels a heaviness in her throat. This does not happen when she is upright.   ROS - otherwise negative  Orthostatic vitals -  lying - 140/100 - HR 72 Sitting 120/80 HR 76 -  Standing - 120/74 - HR 80    PE: Gen: NAD Lungs CTAB Heart RRR Abd: NT, non-distended Pelvic deferred Ext: no edema  A: 6days postpartum w chest pain of unknown etiology  P: CBC, CMET, LDH, Uric acid - drawn here in our lab D/w DR Pennie Rushing and pt will see Dr Allyson Sabal at [facility] this afternoon.   S.Breigh Annett, CNM

## 2012-07-14 ENCOUNTER — Ambulatory Visit (HOSPITAL_COMMUNITY): Payer: Medicaid Other

## 2012-07-15 ENCOUNTER — Ambulatory Visit (HOSPITAL_COMMUNITY): Payer: Medicaid Other

## 2012-07-22 ENCOUNTER — Encounter: Payer: Self-pay | Admitting: Obstetrics and Gynecology

## 2012-07-28 ENCOUNTER — Inpatient Hospital Stay (HOSPITAL_COMMUNITY): Admission: RE | Admit: 2012-07-28 | Payer: Medicaid Other | Source: Ambulatory Visit

## 2012-08-12 ENCOUNTER — Ambulatory Visit: Payer: Medicaid Other | Admitting: Obstetrics and Gynecology

## 2012-08-12 ENCOUNTER — Encounter: Payer: Self-pay | Admitting: Obstetrics and Gynecology

## 2012-08-12 ENCOUNTER — Telehealth: Payer: Self-pay | Admitting: Obstetrics and Gynecology

## 2012-08-12 NOTE — Telephone Encounter (Signed)
Message copied by Mason Jim on Fri Aug 12, 2012  1:36 PM ------      Message from: Jaymes Graff      Created: Fri Aug 12, 2012 11:34 AM       Pt left without being seen but her form is c/w PP depression.  Please call the pt and reschedule and ask abut HI or SI.  If she has any she should be seen by the ER ASAP.  Thank you ------

## 2012-08-12 NOTE — Telephone Encounter (Signed)
Message copied by Mason Jim on Fri Aug 12, 2012 12:20 PM ------      Message from: Jaymes Graff      Created: Fri Aug 12, 2012 11:34 AM       Pt left without being seen but her form is c/w PP depression.  Please call the pt and reschedule and ask abut HI or SI.  If she has any she should be seen by the ER ASAP.  Thank you ------

## 2012-08-12 NOTE — Telephone Encounter (Signed)
TC to pt. LM to return call.  

## 2012-08-12 NOTE — Telephone Encounter (Signed)
TC to pt. To F/U possible PP depression. States is currently in a meeting but denies any SI or HI. Requested call later today.  Is awaiting call from appts ot R/S appt.

## 2012-08-12 NOTE — Telephone Encounter (Signed)
Message copied by Mason Jim on Fri Aug 12, 2012 12:21 PM ------      Message from: Jaymes Graff      Created: Fri Aug 12, 2012 11:34 AM       Pt left without being seen but her form is c/w PP depression.  Please call the pt and reschedule and ask abut HI or SI.  If she has any she should be seen by the ER ASAP.  Thank you ------

## 2012-08-12 NOTE — Progress Notes (Signed)
Date of delivery: 07/02/12 Female Name: [name] Vaginal delivery:yes Cesarean section:no Tubal ligation:yes GDM:no Breast Feeding:yes Bottle Feeding:yes Post-Partum Blues:yes Abnormal pap: 10 yrs ago  Normal GU function: no leaking, incontinence  Normal GI function:yes Returning to work:yes EPDS: 20 Pt left without being seen.  Will refer to triage to call pt

## 2012-08-12 NOTE — Telephone Encounter (Signed)
TC from pt. States again no SI or HI. States is having increased panic attacks since delivery. Had prior hx. States wakes at night with heart racing and nervous about baby. Sched for eval with LC and PP visit 08/15/12. Pt instructed to call if sx increase or any concerns; Supportive care given. Pt verbalizes comprehension.

## 2012-08-12 NOTE — Telephone Encounter (Signed)
Message copied by Mason Jim on Fri Aug 12, 2012  1:24 PM ------      Message from: Jaymes Graff      Created: Fri Aug 12, 2012 11:34 AM       Pt left without being seen but her form is c/w PP depression.  Please call the pt and reschedule and ask abut HI or SI.  If she has any she should be seen by the ER ASAP.  Thank you ------

## 2012-08-15 ENCOUNTER — Ambulatory Visit: Payer: Medicaid Other | Admitting: Family Medicine

## 2012-08-17 ENCOUNTER — Ambulatory Visit: Payer: Medicaid Other | Admitting: Family Medicine

## 2012-12-23 ENCOUNTER — Telehealth: Payer: Self-pay | Admitting: Cardiovascular Disease

## 2012-12-26 ENCOUNTER — Other Ambulatory Visit: Payer: Self-pay | Admitting: Cardiovascular Disease

## 2012-12-26 DIAGNOSIS — I10 Essential (primary) hypertension: Secondary | ICD-10-CM

## 2012-12-29 ENCOUNTER — Ambulatory Visit (HOSPITAL_COMMUNITY)
Admission: RE | Admit: 2012-12-29 | Discharge: 2012-12-29 | Disposition: A | Discharge status: Home / Self Care | Payer: Medicaid Other | Source: Ambulatory Visit | Attending: Cardiovascular Disease | Admitting: Cardiovascular Disease

## 2012-12-29 DIAGNOSIS — R079 Chest pain, unspecified: Secondary | ICD-10-CM | POA: Insufficient documentation

## 2012-12-29 DIAGNOSIS — I10 Essential (primary) hypertension: Secondary | ICD-10-CM

## 2012-12-29 NOTE — Progress Notes (Signed)
[facility]   2D echo completed 12/29/2012.   Cindy Don Tiu, RDCS  

## 2013-01-05 ENCOUNTER — Telehealth: Payer: Self-pay | Admitting: Cardiovascular Disease

## 2013-01-05 NOTE — Telephone Encounter (Signed)
Returning your call-the next 2 hrs she will be in class-if you call-just leave a message.

## 2013-01-05 NOTE — Telephone Encounter (Signed)
I spoke with patient and notified her that Dr Allyson Sabal would like to see her in the office to review the echo results.  Patient agreeable and will speak with scheduler

## 2013-01-06 ENCOUNTER — Encounter: Payer: Self-pay | Admitting: Cardiology

## 2013-01-06 ENCOUNTER — Ambulatory Visit (INDEPENDENT_AMBULATORY_CARE_PROVIDER_SITE_OTHER): Payer: Medicaid Other | Admitting: Cardiology

## 2013-01-06 VITALS — BP 132/90 | HR 78 | Ht 65.0 in | Wt 142.5 lb

## 2013-01-06 DIAGNOSIS — I519 Heart disease, unspecified: Secondary | ICD-10-CM

## 2013-01-06 DIAGNOSIS — I10 Essential (primary) hypertension: Secondary | ICD-10-CM

## 2013-01-06 DIAGNOSIS — R079 Chest pain, unspecified: Secondary | ICD-10-CM

## 2013-01-06 DIAGNOSIS — R002 Palpitations: Secondary | ICD-10-CM

## 2013-01-06 DIAGNOSIS — R000 Tachycardia, unspecified: Secondary | ICD-10-CM | POA: Insufficient documentation

## 2013-01-06 HISTORY — DX: Chest pain, unspecified: R07.9

## 2013-01-06 MED ORDER — LISINOPRIL 2.5 MG PO TABS: 2.5000 mg | ORAL_TABLET | Freq: Two times a day (BID) | ORAL | Status: DC

## 2013-01-06 NOTE — Assessment & Plan Note (Signed)
Begin ACE.  Pt's hair fell out with Labetalol.

## 2013-01-06 NOTE — Assessment & Plan Note (Signed)
No SOB, New finding, possible from HTN not CAD- more global hypokinesis. Add ACE.  Check labs and CXR.

## 2013-01-06 NOTE — Assessment & Plan Note (Addendum)
Occurs with chest pain, though not before pain. She will wear 30 day event monitor.

## 2013-01-06 NOTE — Progress Notes (Signed)
01/06/2013   PCP: Janine Limbo, MD   Chief Complaint  Patient presents with  . Follow-up    chest pain off and on since last visit (1/14), occas. pain is worse w/laying down and worse still laying on left side. occas. tachy/palpitations    Primary Cardiologist: Dr. Allyson Sabal  HPI:  27 year old divorced female mother of 5 children is here today for complaints of chest pain. She has a history of atypical chest pain and orthopnea 8 days postpartum in January of this year she had seen Dr. Allyson Sabal he did not fill the pain was 2 to coronary artery disease but perhaps pericardial effusion and he ordered an echo and PA and lateral chest x-ray. He also placed her on labetalol and hydrochlorothiazide. She developed alopecia with labetalol and stopped her medications.  She is continued with chest pain and had her 2-D echo July 17 of this year. The echo was abnormal in that her EF is 45-50% her wall thickness in the LV was normal she had diffuse hypokinesis and left ventricular diastolic function parameters are normal. Skin no pericardial effusion.  She is here today for the chest pain as well as for the results of the echo.  Her chest pain occurs usually at rest or with any time and has a dullness or tightness in her chest around to around her left chest it is associated with heart racing at times at times she is also nauseated she denies any shortness of breath. She also has significant headaches and pain in her neck as well.   Occasionally the headache and the neck pain will travel down her left arm.  She feels her headaches are related to hypertension.  Allergies  Allergen Reactions  . Labetalol Other (See Comments)    Alopecia     Current Outpatient Prescriptions  Medication Sig Dispense Refill  . Acetaminophen (TYLENOL PO) Take by mouth as needed.       Marland Kitchen lisinopril (PRINIVIL,ZESTRIL) 2.5 MG tablet Take 1 tablet (2.5 mg total) by mouth 2 (two) times daily.  60 tablet  11   No  current facility-administered medications for this visit.    Past Medical History  Diagnosis Date  . Hypertension   . Low BMI   . Postpartum hypertension   . Spotting in first trimester     With SAB  . Herpes     cold sores  . GBS carrier     First pregnancy  . H/O varicella   . Abnormal Pap smear 08/2003    colpo  . Pelvic pain     With pregnancy  . Diastasis recti 12/12/08  . Stress   . H/O chest pain 06/17/11  . Panic attack     HAS BEEN RX'D XANAX  . Right ovarian cyst 2003    RT OOPHRECTOMY  . Preeclampsia 2010,2011    POST PARTUM;WAS PUT ON BP MEDS  . Preterm contractions 2007    GIVEN BETAMETHASONE AND PROCARDIA TO STOP CTXS  . Anemia     FeSO4 SUPP IN PAST  . Headache(784.0)     migraines;D/T BP  . GERD 2011    PRILOSEC TAKEN  . Carpal tunnel syndrome of right wrist     Past Surgical History  Procedure Laterality Date  . Ovary removed      right   . Dilation and curettage of uterus  2009  . Breast cyst removal  2005    right  . Tubal ligation  07/03/2012  Procedure: POST PARTUM TUBAL LIGATION;  Surgeon: Purcell Nails, MD;  Location: [facility];  Service: Gynecology;  Laterality: Bilateral;  Bilateral post partum tubal ligation    ZOX:WRUEAVW:UJ colds or fevers, no weight changes Skin:no rashes or ulcers HEENT:no blurred vision, no congestion CV:see HPI PUL:see HPI GI:no diarrhea constipation or melena, no indigestion GU:no hematuria, no dysuria MS:no joint pain, no claudication Neuro:no syncope, no lightheadedness Endo:no diabetes, no thyroid disease GYN:  No pregnancy has had bilateral tubal ligation   PHYSICAL EXAM BP 132/90  Pulse 78  Ht 5\' 5"  (1.651 m)  Wt 142 lb 8 oz (64.638 kg)  BMI 23.71 kg/m2 General:Pleasant affect, NAD Skin:Warm and dry, brisk capillary refill HEENT:normocephalic, sclera clear, mucus membranes moist Neck:supple, no JVD, no bruits  Heart:S1S2, S4- RRR without murmur, gallup, rub or click Lungs:clear without rales,  rhonchi, or wheezes WJX:BJYN, non tender, + BS, do not palpate liver spleen or masses Ext:no lower ext edema, 2+ pedal pulses, 2+ radial pulses Neuro:alert and oriented, MAE, follows commands, + facial symmetry  EKG:SR with flipped t waves in V1 and V2, new in V2.  Reviewed with Dr. Tresa Endo   ASSESSMENT AND PLAN Chest pain at rest Discussed with Dr. Tresa Endo and reviewed EKG.  Do not believe chest pain is related to CAD.  Most likely to HTN.  LV dysfunction, by Echo EF 45-50% No SOB, New finding, possible from HTN not CAD- more global hypokinesis. Add ACE.  Check labs and CXR.   Hypertension Begin ACE.  Pt's hair fell out with Labetalol.  Tachycardia Occurs with chest pain, though not before pain. She will wear 30 day event monitor.  Lisinopril 2.5 mg BID added.  The patient is still breast-feeding but only at night. Literature does not show significant data concerning lisinopril with breast-feeding. I have asked her to stop breast feeding if possible and she has agreed to this we discussed the importance of taking care of her heart.  I explained we could not guarantee the medication was safe for the baby.  She agreed to use formula only.  She'll follow with Dr. Allyson Sabal in 2 weeks to evaluate blood pressure and review her labs and chest x-ray.

## 2013-01-06 NOTE — Assessment & Plan Note (Signed)
Discussed with Dr. Tresa Endo and reviewed EKG.  Do not believe chest pain is related to CAD.  Most likely to HTN.

## 2013-01-06 NOTE — Patient Instructions (Signed)
You have Left Ventricular dysfunction,  EF- ejection fraction is 45-50%  Normal is 55-60%, so yours is slightly lower.  To treat we will add Lisinopril 2.5 mg twice a day.  This will lower your BP and help your heart pump better.  We will check chest x ray.  We will check lab work.  You will follow up with Dr. Allyson Sabal in 2 weeks for further eval.    We will have you wear a heart monitor for 30 days to determine what your heart racing is actually doing.  Call with questions or problems.

## 2013-01-09 ENCOUNTER — Ambulatory Visit
Admission: RE | Admit: 2013-01-09 | Discharge: 2013-01-09 | Disposition: A | Discharge status: Home / Self Care | Payer: Medicaid Other | Source: Ambulatory Visit | Attending: Cardiology | Admitting: Cardiology

## 2013-01-09 ENCOUNTER — Encounter: Payer: Self-pay | Admitting: *Deleted

## 2013-01-09 DIAGNOSIS — R079 Chest pain, unspecified: Secondary | ICD-10-CM

## 2013-01-09 LAB — COMPLETE METABOLIC PANEL WITH GFR
ALT: <8 U/L (ref 0–35)
Albumin: 4.3 g/dL (ref 3.5–5.2)
Alkaline Phosphatase: 66 U/L (ref 39–117)
GFR, Est Non African American: >89 mL/min
Glucose, Bld: 82 mg/dL (ref 70–99)
Potassium: 4 mEq/L (ref 3.5–5.3)
Sodium: 140 mEq/L (ref 135–145)
Total Protein: 6.6 g/dL (ref 6.0–8.3)

## 2013-01-09 LAB — CBC
HCT: 37.3 % (ref 36.0–46.0)
Hemoglobin: 12.6 g/dL (ref 12.0–15.0)
MCH: 25 pg — ABNORMAL LOW (ref 26.0–34.0)
MCV: 74.2 fL — ABNORMAL LOW (ref 78.0–100.0)
Platelets: 237 10*3/uL (ref 150–400)
RBC: 5.03 MIL/uL (ref 3.87–5.11)

## 2013-01-10 ENCOUNTER — Telehealth: Payer: Self-pay | Admitting: Cardiology

## 2013-01-10 NOTE — Telephone Encounter (Signed)
Returned call.  Pt stated she started getting shortness of breath and feeling dizzy.  Stated she doesn't know if it was from her medicine or carrying her little baby.  Stated she felt a little confusion and has been having episodes like this.  Asked pt if she has been wearing monitor and stated she has.  Report retrieved and pt has been report symptoms of dizziness, chest pain and heart racing.  Asked pt if she has a way to check BP and denied.  Stated she doesn't think it is up b/c she usually gets a funny feeling and she has not been having that feeling.  Pt informed RN will discuss with MD/PA for further instructions.  Pt verbalized understanding and agreed w/ plan.  Message forwarded to B. Leron Croak, PA-C for further instructions.

## 2013-01-10 NOTE — Telephone Encounter (Signed)
Feeling a little SOB-not sure it might be new medicine.-not sure if this should be marked high priority-please let me know.

## 2013-01-10 NOTE — Telephone Encounter (Signed)
Cardiac monitor shows normal sinus rhythm at the times when the patient indicated dizziness, chest pain or heart racing symptoms.  I wonder if her "racing heart",  chest pain, dizziness symptoms are related to anxiety, orthostatic hypotension.  As no acute abnormality on the CardioNet monitor for seen. Patient should probably followup with her primary care provider.    Shirlie Enck 4:50 PM

## 2013-01-10 NOTE — Telephone Encounter (Signed)
Returned call and informed pt per instructions by MD/PA.  Pt advised to continue to wear her monitor and report symptoms as she has been.  Pt verbalized understanding and agreed w/ plan.

## 2013-01-11 ENCOUNTER — Ambulatory Visit: Payer: Medicaid Other | Admitting: Cardiology

## 2013-01-11 ENCOUNTER — Ambulatory Visit (HOSPITAL_COMMUNITY): Payer: Medicaid Other

## 2013-01-12 ENCOUNTER — Telehealth: Payer: Self-pay | Admitting: *Deleted

## 2013-01-12 NOTE — Telephone Encounter (Signed)
Call to pt and left message that results normal and keep appt on 18th.  Call back before 4pm if questions.

## 2013-01-12 NOTE — Telephone Encounter (Signed)
Message copied by Chauncey Reading on Thu Jan 12, 2013 10:35 AM ------      Message from: Leone Brand      Created: Tue Jan 10, 2013  7:52 AM       Lab results are all normal.  Follow up with Dr. Allyson Sabal 01/30/13. ------

## 2013-01-19 ENCOUNTER — Telehealth: Payer: Self-pay | Admitting: Cardiovascular Disease

## 2013-01-19 NOTE — Telephone Encounter (Signed)
Returned call.  Left message to call back before 4pm.  

## 2013-01-19 NOTE — Telephone Encounter (Signed)
Per Answering Service-pt called and said that the Cardionet Monitor tape is making her break out in a rash,She had to take the monitor off-Please call.

## 2013-01-19 NOTE — Telephone Encounter (Signed)
Returned call.  Pt c/o skin irritation with the stickers with the monitor.  Stated she took it off last night and called to find out what to do.  Asked pt if she has used the sensitive stickers and pt stated she may have, but doesn't remember.  Pt advised, if willing, to call [employer] and ask for the sensitive stickers to be mailed to her and try them for a few days.  Pt advised to call back if still w/ irritation.  Pt verbalized understanding and agreed w/ plan.

## 2013-01-19 NOTE — Telephone Encounter (Signed)
Returning your call. °

## 2013-01-27 ENCOUNTER — Encounter: Payer: Self-pay | Admitting: Cardiovascular Disease

## 2013-01-30 ENCOUNTER — Ambulatory Visit: Payer: Medicaid Other | Admitting: Cardiovascular Disease

## 2013-01-30 ENCOUNTER — Ambulatory Visit (INDEPENDENT_AMBULATORY_CARE_PROVIDER_SITE_OTHER): Payer: Medicaid Other | Admitting: Cardiovascular Disease

## 2013-01-30 ENCOUNTER — Encounter: Payer: Self-pay | Admitting: Cardiovascular Disease

## 2013-01-30 VITALS — BP 128/72 | HR 80 | Ht 65.0 in | Wt 143.0 lb

## 2013-01-30 DIAGNOSIS — I519 Heart disease, unspecified: Secondary | ICD-10-CM

## 2013-01-30 DIAGNOSIS — R000 Tachycardia, unspecified: Secondary | ICD-10-CM

## 2013-01-30 DIAGNOSIS — I10 Essential (primary) hypertension: Secondary | ICD-10-CM

## 2013-01-30 DIAGNOSIS — R079 Chest pain, unspecified: Secondary | ICD-10-CM

## 2013-01-30 NOTE — Assessment & Plan Note (Signed)
Under good control on low dose

## 2013-01-30 NOTE — Progress Notes (Signed)
01/30/2013 Ivin Booty   05/11/1986  [id number]  Primary Physician Janine Limbo, MD Primary Cardiologist: Runell Gess MD Roseanne Reno   HPI:  27 year old divorced female mother of 5 children is here today for complaints of chest pain. She has a history of atypical chest pain and orthopnea 8 days postpartum in January of this year she had seen Dr. Allyson Sabal he did not fill the pain was 2 to coronary artery disease but perhaps pericardial effusion and he ordered an echo and PA and lateral chest x-ray. He also placed her on labetalol and hydrochlorothiazide. She developed alopecia with labetalol and stopped her medications. She is continued with chest pain and had her 2-D echo July 17 of this year. The echo was abnormal in that her EF is 45-50% her wall thickness in the LV was normal she had diffuse hypokinesis and left ventricular diastolic function parameters are normal. Skin no pericardial effusion. She is here today for the chest pain as well as for the results of the echo.  Her chest pain occurs usually at rest or with any time and has a dullness or tightness in her chest around to around her left chest it is associated with heart racing at times at times she is also nauseated she denies any shortness of breath. She also has significant headaches and pain in her neck as well. Occasionally the headache and the neck pain will travel down her left arm. She was seen in the office 01/06/13 by Nada Boozer registered nurse practitioner her chest pain has resolved. She still has tachycardia palpitations for these were not captured on a vent monitor. She feels better on low dose ACE in addition with a controlled blood pressure. She does have 5 children, is working as a Financial planner at [employer], and apparently is under a lot of stress.    Current Outpatient Prescriptions  Medication Sig Dispense Refill  . Acetaminophen (TYLENOL PO) Take by mouth as needed.       Marland Kitchen  lisinopril (PRINIVIL,ZESTRIL) 2.5 MG tablet Take 1 tablet (2.5 mg total) by mouth 2 (two) times daily.  60 tablet  11  . naproxen sodium (ANAPROX) 220 MG tablet Take 220 mg by mouth as needed.       No current facility-administered medications for this visit.    Allergies  Allergen Reactions  . Labetalol Other (See Comments)    Alopecia     History   Social History  . Marital Status: Divorced    Spouse Name: N/A    Number of Children: 4  . Years of Education: 15   Occupational History  . STUDENT    Social History Main Topics  . Smoking status: Never Smoker   . Smokeless tobacco: Never Used  . Alcohol Use: No  . Drug Use: No  . Sexual Activity: Yes    Partners: Male    Birth Control/ Protection: Surgical     Comment: tubal   Other Topics Concern  . Not on file   Social History Narrative  . No narrative on file     Review of Systems: General: negative for chills, fever, night sweats or weight changes.  Cardiovascular: negative for chest pain, dyspnea on exertion, edema, orthopnea, palpitations, paroxysmal nocturnal dyspnea or shortness of breath Dermatological: negative for rash Respiratory: negative for cough or wheezing Urologic: negative for hematuria Abdominal: negative for nausea, vomiting, diarrhea, bright red blood per rectum, melena, or hematemesis Neurologic: negative for visual changes, syncope, or dizziness All  other systems reviewed and are otherwise negative except as noted above.    Blood pressure 128/72, pulse 80, height 5\' 5"  (1.651 m), weight 143 lb (64.864 kg).  General appearance: alert and no distress Neck: no adenopathy, no carotid bruit, no JVD, supple, symmetrical, trachea midline and thyroid not enlarged, symmetric, no tenderness/mass/nodules Lungs: clear to auscultation bilaterally Heart: regular rate and rhythm, S1, S2 normal, no murmur, click, rub or gallop Extremities: extremities normal, atraumatic, no cyanosis or edema  EKG not  performed today  ASSESSMENT AND PLAN:   Chest pain at rest Resolved since previous office visit  Tachycardia Patient had an event monitor which she wore for several weeks it showed sinus rhythm even during the time she activated the device during symptoms. She was on labetalol which resulted in an LP shunt and was switched to lisinopril which she feels better taking.  Hypertension Under good control on low dose  LV dysfunction, by Echo EF 45-50% EF 45-50% with global hypokinesia by recent 2-D echo. She now has control blood pressure a low dose ACE inhibitor. We will recheck a 2-D echo in several months and see her back after that for further evaluation      Runell Gess MD [facility], [facility] 01/30/2013 9:02 AM

## 2013-01-30 NOTE — Patient Instructions (Addendum)
  We will see you back in follow up in 3 months with [name] and 6 months with Dr Allyson Sabal  Dr Allyson Sabal has ordered an echocardiogram to be done in 3 months (November) prior to your office visit with the extender

## 2013-01-30 NOTE — Assessment & Plan Note (Signed)
Resolved since previous office visit

## 2013-01-30 NOTE — Assessment & Plan Note (Signed)
Patient had an event monitor which she wore for several weeks it showed sinus rhythm even during the time she activated the device during symptoms. She was on labetalol which resulted in an LP shunt and was switched to lisinopril which she feels better taking.

## 2013-01-30 NOTE — Assessment & Plan Note (Signed)
EF 45-50% with global hypokinesia by recent 2-D echo. She now has control blood pressure a low dose ACE inhibitor. We will recheck a 2-D echo in several months and see her back after that for further evaluation

## 2013-02-08 ENCOUNTER — Telehealth: Payer: Self-pay | Admitting: Cardiovascular Disease

## 2013-02-08 NOTE — Telephone Encounter (Signed)
Please call -having horrible headache and rattling cough-she thinks the blood pressure medicine is causing the cough.Also wants to know what she can take for her headache?She already took Excedrin,Alive,Tylenol and Motrin.

## 2013-02-08 NOTE — Telephone Encounter (Signed)
Returned call.  Left message to call back before 4pm.  Lisinopril may be the cause of cough.  Dr. Allyson Sabal will be notified.  Will need to know pt's BP to determine if elevated or not.  Awaiting return call from pt to gather more information before notifying MD.

## 2013-02-09 ENCOUNTER — Telehealth: Payer: Self-pay | Admitting: Cardiovascular Disease

## 2013-02-09 NOTE — Telephone Encounter (Signed)
Returned call.  Pt c/o cough since about Sunday or Monday.  Stated she doesn't have any other cold symptoms.  Stated she remembered that cough is a SE of lisinopril.  Pt c/o nonproductive cough.  Denied checking BP.  Stated she thinks it's been fine.  Pt also c/o intermittent HAs that are "really bad."  Stated when they come it goes from her temple to the back of her neck.  Pt denied HA today.  Denied vision changes or dizziness.  Pt stated she likes the medicine, but the cough is becoming a nuisance and she would like to switch to something else.  Pt informed Dr. Allyson Sabal will be notified as lisinopril can cause cough and he may change med.  Pt verbalized understanding and agreed w/ plan.  Pt will also call back this afternoon w/ BP (going to pharmacy).  Agreed to rest at least 5 mins prior to checking BP.  Message forwarded to Dr. Allyson Sabal for review and further instructions

## 2013-02-09 NOTE — Telephone Encounter (Signed)
Returning call from yesterday. Having a lot problems with a new medication. Pt said not to hang up if on hold.

## 2013-02-09 NOTE — Telephone Encounter (Signed)
Returned call.  Left message to call back before 4pm if assistance still needed. 

## 2013-02-10 ENCOUNTER — Other Ambulatory Visit: Payer: Self-pay | Admitting: *Deleted

## 2013-02-10 DIAGNOSIS — R002 Palpitations: Secondary | ICD-10-CM

## 2013-02-10 DIAGNOSIS — R000 Tachycardia, unspecified: Secondary | ICD-10-CM

## 2013-02-15 ENCOUNTER — Ambulatory Visit: Payer: Medicaid Other | Admitting: Cardiovascular Disease

## 2013-02-15 NOTE — Telephone Encounter (Signed)
Discontinue lisinopril and begin her on losartan 25 mg a day

## 2013-02-16 MED ORDER — LOSARTAN POTASSIUM 25 MG PO TABS: 25.0000 mg | ORAL_TABLET | Freq: Every day | ORAL | Status: DC

## 2013-02-16 NOTE — Telephone Encounter (Signed)
Returned call and informed pt per instructions by MD/PA.  Pt verbalized understanding and agreed w/ plan.  Rx sent to pharmacy.  

## 2013-03-08 ENCOUNTER — Telehealth: Payer: Self-pay | Admitting: Cardiovascular Disease

## 2013-03-08 NOTE — Telephone Encounter (Signed)
Returned call.  Pt c/o getting dizzy all of a sudden.  Stated she had a HA this morning.  Stated they took her BP and it was 154/97.  Pt c/o dizziness and HA now.  Stated she feels like she needs to put her head down b/c she's so dizzy.  Pt stated she has not been taking Cozaar b/c it was making her hands swell to the point she had to take her wedding band off.  Pt informed swelling is a SE of Cozaar.  Pt also informed she has been off of Cozaar for 2.5 weeks and symptoms likely r/t elevated BP.  Pt stated BP was 178/94 two Sundays ago at urgent care.  Stated she was seen there for HA.  Pt advised to take Cozaar now or ASAP and increase fluid intake as she stated she has had little water intake and has been drinking V8 juice.  Pt informed V8 usually has a high sodium content, which could be adding to her symptoms.  Pt advised to call back ~ 1-2 hours after taking BP med to see if BP is lowering since pt does not want to leave work today.  Pt advised ER for worsening symptoms or urgent care for no change and to go Today.  Pt verbalized understanding and agreed w/ plan.  No available appts in office today and pt aware.  Message forwarded to Dr. Allyson Sabal [facility]).

## 2013-03-08 NOTE — Telephone Encounter (Signed)
Please call-headaches and dizziness. Her blood pressure is 154/97.

## 2013-03-10 NOTE — Telephone Encounter (Signed)
Have patient come in to see mid-level provider

## 2013-03-13 NOTE — Telephone Encounter (Signed)
Returned call.  Left message to call back for an appt w/ NP/PA for BP check and if questions, before 4pm.

## 2013-04-17 ENCOUNTER — Inpatient Hospital Stay (HOSPITAL_COMMUNITY): Admission: RE | Admit: 2013-04-17 | Payer: Medicaid Other | Source: Ambulatory Visit

## 2013-04-24 ENCOUNTER — Emergency Department (HOSPITAL_COMMUNITY): Payer: BC Managed Care – PPO

## 2013-04-24 ENCOUNTER — Encounter (HOSPITAL_COMMUNITY): Payer: Self-pay | Admitting: Emergency Medicine

## 2013-04-24 ENCOUNTER — Emergency Department (HOSPITAL_COMMUNITY)
Admission: EM | Admit: 2013-04-24 | Discharge: 2013-04-25 | Disposition: A | Discharge status: Home / Self Care | Payer: BC Managed Care – PPO | Attending: Emergency Medicine | Admitting: Emergency Medicine

## 2013-04-24 ENCOUNTER — Emergency Department (INDEPENDENT_AMBULATORY_CARE_PROVIDER_SITE_OTHER)
Admission: EM | Admit: 2013-04-24 | Discharge: 2013-04-24 | Disposition: A | Discharge status: Other Acute Inpatient Hospital | Payer: BC Managed Care – PPO | Source: Home / Self Care | Attending: Emergency Medicine | Admitting: Emergency Medicine

## 2013-04-24 DIAGNOSIS — R110 Nausea: Secondary | ICD-10-CM | POA: Insufficient documentation

## 2013-04-24 DIAGNOSIS — Z733 Stress, not elsewhere classified: Secondary | ICD-10-CM | POA: Insufficient documentation

## 2013-04-24 DIAGNOSIS — I519 Heart disease, unspecified: Secondary | ICD-10-CM | POA: Insufficient documentation

## 2013-04-24 DIAGNOSIS — Z79899 Other long term (current) drug therapy: Secondary | ICD-10-CM | POA: Insufficient documentation

## 2013-04-24 DIAGNOSIS — M25519 Pain in unspecified shoulder: Secondary | ICD-10-CM | POA: Insufficient documentation

## 2013-04-24 DIAGNOSIS — R079 Chest pain, unspecified: Secondary | ICD-10-CM

## 2013-04-24 DIAGNOSIS — G43909 Migraine, unspecified, not intractable, without status migrainosus: Secondary | ICD-10-CM

## 2013-04-24 DIAGNOSIS — F410 Panic disorder [episodic paroxysmal anxiety] without agoraphobia: Secondary | ICD-10-CM | POA: Insufficient documentation

## 2013-04-24 DIAGNOSIS — K219 Gastro-esophageal reflux disease without esophagitis: Secondary | ICD-10-CM | POA: Insufficient documentation

## 2013-04-24 DIAGNOSIS — Z8679 Personal history of other diseases of the circulatory system: Secondary | ICD-10-CM | POA: Insufficient documentation

## 2013-04-24 DIAGNOSIS — Z8619 Personal history of other infectious and parasitic diseases: Secondary | ICD-10-CM | POA: Insufficient documentation

## 2013-04-24 DIAGNOSIS — D649 Anemia, unspecified: Secondary | ICD-10-CM | POA: Insufficient documentation

## 2013-04-24 DIAGNOSIS — Z888 Allergy status to other drugs, medicaments and biological substances status: Secondary | ICD-10-CM | POA: Insufficient documentation

## 2013-04-24 DIAGNOSIS — Z8669 Personal history of other diseases of the nervous system and sense organs: Secondary | ICD-10-CM | POA: Insufficient documentation

## 2013-04-24 DIAGNOSIS — I10 Essential (primary) hypertension: Secondary | ICD-10-CM

## 2013-04-24 DIAGNOSIS — G5600 Carpal tunnel syndrome, unspecified upper limb: Secondary | ICD-10-CM | POA: Insufficient documentation

## 2013-04-24 DIAGNOSIS — Z87898 Personal history of other specified conditions: Secondary | ICD-10-CM | POA: Insufficient documentation

## 2013-04-24 DIAGNOSIS — Z8742 Personal history of other diseases of the female genital tract: Secondary | ICD-10-CM | POA: Insufficient documentation

## 2013-04-24 DIAGNOSIS — R0789 Other chest pain: Secondary | ICD-10-CM

## 2013-04-24 LAB — CBC
HCT: 36.1 % (ref 36.0–46.0)
Hemoglobin: 11.8 g/dL — ABNORMAL LOW (ref 12.0–15.0)
MCV: 78.5 fL (ref 78.0–100.0)
Platelets: 221 10*3/uL (ref 150–400)
RBC: 4.6 MIL/uL (ref 3.87–5.11)
RDW: 13.4 % (ref 11.5–15.5)
WBC: 8.7 10*3/uL (ref 4.0–10.5)

## 2013-04-24 LAB — URINALYSIS, ROUTINE W REFLEX MICROSCOPIC
Hgb urine dipstick: NEGATIVE
Leukocytes, UA: NEGATIVE
Protein, ur: NEGATIVE mg/dL
Specific Gravity, Urine: 1.015 (ref 1.005–1.030)
Urobilinogen, UA: 0.2 mg/dL (ref 0.0–1.0)
pH: 7 (ref 5.0–8.0)

## 2013-04-24 LAB — POCT I-STAT, CHEM 8
Chloride: 104 mEq/L (ref 96–112)
Creatinine, Ser: 0.8 mg/dL (ref 0.50–1.10)
HCT: 39 % (ref 36.0–46.0)
Hemoglobin: 13.3 g/dL (ref 12.0–15.0)
Potassium: 3.1 mEq/L — ABNORMAL LOW (ref 3.5–5.1)
Sodium: 141 mEq/L (ref 135–145)
TCO2: 23 mmol/L (ref 0–100)

## 2013-04-24 LAB — POCT I-STAT TROPONIN I: Troponin i, poc: 0 ng/mL (ref 0.00–0.08)

## 2013-04-24 MED ORDER — IBUPROFEN 600 MG PO TABS: 600.0000 mg | ORAL_TABLET | Freq: Four times a day (QID) | ORAL | Status: DC | PRN

## 2013-04-24 MED ORDER — ASPIRIN 81 MG PO CHEW
324.0000 mg | CHEWABLE_TABLET | Freq: Once | ORAL | Status: AC
  Administered 2013-04-24: 324 mg via ORAL
  Filled 2013-04-24: qty 4

## 2013-04-24 MED ORDER — SODIUM CHLORIDE 0.9 % IV SOLN
INTRAVENOUS | Status: DC
  Administered 2013-04-24: 20:00:00 via INTRAVENOUS

## 2013-04-24 MED ORDER — ASPIRIN 81 MG PO CHEW
CHEWABLE_TABLET | ORAL | Status: AC
  Filled 2013-04-24: qty 4

## 2013-04-24 MED ORDER — METOCLOPRAMIDE HCL 10 MG PO TABS: 10.0000 mg | ORAL_TABLET | Freq: Four times a day (QID) | ORAL | Status: DC | PRN

## 2013-04-24 MED ORDER — SODIUM CHLORIDE 0.9 % IV SOLN
1000.0000 mL | Freq: Once | INTRAVENOUS | Status: AC
  Administered 2013-04-24: 1000 mL via INTRAVENOUS

## 2013-04-24 MED ORDER — ASPIRIN 81 MG PO CHEW
324.0000 mg | CHEWABLE_TABLET | Freq: Once | ORAL | Status: AC
  Administered 2013-04-24: 324 mg via ORAL

## 2013-04-24 MED ORDER — KETOROLAC TROMETHAMINE 30 MG/ML IJ SOLN
30.0000 mg | Freq: Once | INTRAMUSCULAR | Status: AC
  Administered 2013-04-24: 30 mg via INTRAVENOUS
  Filled 2013-04-24: qty 1

## 2013-04-24 MED ORDER — POTASSIUM CHLORIDE CRYS ER 20 MEQ PO TBCR
40.0000 meq | EXTENDED_RELEASE_TABLET | Freq: Once | ORAL | Status: AC
  Administered 2013-04-25: 40 meq via ORAL
  Filled 2013-04-24: qty 2

## 2013-04-24 NOTE — ED Provider Notes (Signed)
Chief Complaint:   Chief Complaint  Patient presents with  . Chest Pain    History of Present Illness:   [name] is a 27 year old female who presents tonight with chest pain, headache, and elevated blood pressure. The headache has been going on for about a week. It is, involving the entire cranium and throbbing in nature. She has the headache daily, it's worse with stress, is associated with nausea, photophobia, and phonophobia. She denies any visual symptoms, fever, chills, stiff neck, weakness, or difficulty with speech or ambulation. Her legs have felt a little numb and tingly. She has a history of severe headaches when she was pregnant. She thinks she may have had migraine headaches. She also has chest pain since this morning. She's had chest pain like this off and on for years. She's been seen by Dr. Nanetta Batty and had an echocardiogram which showed left ventricular dysfunction. She's never had a cardiac catheterization. This pain is severe and involves the entire left hemithorax. It's unrelated to position, exertion, activity or meals. The pain is not pleuritic. She denies any associated nausea, diaphoresis, or shortness of breath. She has had high blood pressure for years since she was pregnant. She has 5 children. She had preeclampsia with every one of her children. She's been tried on multiple medications. Right now she is on Cozaar 25 mg a day.  Review of Systems:  Other than noted above, the patient denies any of the following symptoms. Systemic:  No fever, chills, sweats, or fatigue. ENT:  No nasal congestion, rhinorrhea, or sore throat. Pulmonary:  No cough, wheezing, shortness of breath, sputum production, hemoptysis. Cardiac:  No palpitations, rapid heartbeat, dizziness, presyncope or syncope. GI:  No abdominal pain, heartburn, nausea, or vomiting. Ext:  No leg pain or swelling.  PMFSH:  Past medical history, family history, social history, meds, and allergies were reviewed  and updated as needed. Current meds include losartan and Anaprox.  Physical Exam:   Vital signs:  BP 168/102  Pulse 86  Temp(Src) 98.3 F (36.8 C) (Oral)  Resp 14  SpO2 100%  LMP 04/14/2013  Breastfeeding? Unknown Gen:  Alert, oriented, in no distress, skin warm and dry. Eye:  PERRL, lids and conjunctivas normal.  Sclera non-icteric. ENT:  Mucous membranes moist, pharynx clear. Neck:  Supple, no adenopathy or tenderness.  No JVD. Lungs:  Clear to auscultation, no wheezes, rales or rhonchi.  No respiratory distress. Heart:  Regular rhythm.  No gallops, murmers, clicks or rubs. Chest:  No chest wall tenderness. Abdomen:  Soft, nontender, no organomegaly or mass.  Bowel sounds normal.  No pulsatile abdominal mass or bruit. Ext:  No edema.  No calf tenderness and Homann's sign negative.  Pulses full and equal. Skin:  Warm and dry.  No rash.  EKG:   Date: 04/24/2013  Rate: 60  Rhythm: normal sinus rhythm  QRS Axis: normal  Intervals: normal  ST/T Wave abnormalities: nonspecific T wave changes  Conduction Disutrbances:none  Narrative Interpretation: Normal sinus rhythm, T-wave abnormality consider anterior ischemia. She had had some T wave abnormalities in her previous EKG, but they're more pronounced tonight.  Old EKG Reviewed: changes noted, more pronounced T wave abnormalities in anterior precordial leads.  Course in Urgent Care Center:   Begin IV normal saline, given aspirin 325 mg by mouth, and will transfer via [facility].  Assessment:  The primary encounter diagnosis was Chest pain. Diagnoses of Migraine headache and Hypertension were also pertinent to this visit.  Plan:  The patient was transferred to the ED via [facility] in stable condition.  Medical Decision Making:  27 year old female with severe, uncontrolled hypertension and LV dysfunction presents tonight with 1 week history of severe, throbbing headache with photophobia and phonophobia.  She also has a 1 day history of  severe left hemi-thorax pain.  Her EKG shows T wave inversions in precordial leads which had been present previously, but they appear worse tonight.  We will send by [facility].       Reuben Likes, MD 04/24/13 [phone]

## 2013-04-24 NOTE — ED Notes (Signed)
[facility] Tx from UC: pt complains of headache and HTN. EKG-no changes. 20g(L)hand.

## 2013-04-24 NOTE — ED Provider Notes (Signed)
CSN: [id number]     Arrival date & time 04/24/13  2029 History   First MD Initiated Contact with Patient 04/24/13 2259     Chief Complaint  Patient presents with  . Headache  . Hypertension   (Consider location/radiation/quality/duration/timing/severity/associated sxs/prior Treatment) HPI Patient presents as a referral from urgent care for left axilla pain starting roughly at 3 to 4 PM today. She's had similar pain like this on and off for years. There's no associated shortness of breath diaphoresis. Pain is now completely resolved. She's also been having a headache for the past one to 2 weeks. Headache is global and worsened with noise and light. She has associated nausea. She's been diagnosed with migraines in the past and takes Tylenol home with little relief. Patient states her headache is now down to 1/10. She denies any focal weakness or numbness. No recent extended travel or immobilization. No lower extremity swelling or pain. Past Medical History  Diagnosis Date  . Hypertension   . Low BMI   . Postpartum hypertension   . Spotting in first trimester     With SAB  . Herpes     cold sores  . GBS carrier     First pregnancy  . H/O varicella   . Abnormal Pap smear 08/2003    colpo  . Pelvic pain     With pregnancy  . Diastasis recti 12/12/08  . Stress   . H/O chest pain 06/17/11  . Panic attack     HAS BEEN RX'D XANAX  . Right ovarian cyst 2003    RT OOPHRECTOMY  . Preeclampsia 2010,2011    POST PARTUM;WAS PUT ON BP MEDS  . Preterm contractions 2007    GIVEN BETAMETHASONE AND PROCARDIA TO STOP CTXS  . Anemia     FeSO4 SUPP IN PAST  . Headache(784.0)     migraines;D/T BP  . GERD 2011    PRILOSEC TAKEN  . Carpal tunnel syndrome of right wrist   . Palpitations    Past Surgical History  Procedure Laterality Date  . Ovary removed      right   . Dilation and curettage of uterus  2009  . Breast cyst removal  2005    right  . Tubal ligation  07/03/2012    Procedure: POST  PARTUM TUBAL LIGATION;  Surgeon: Purcell Nails, MD;  Location: [facility];  Service: Gynecology;  Laterality: Bilateral;  Bilateral post partum tubal ligation   Family History  Problem Relation Age of Onset  . Heart disease Mother     CHF  . Hypertension Mother   . Asthma Mother     CHILDHOOD  . Diabetes Mother   . Kidney disease Mother     dialysis  . Depression Mother   . Alcohol abuse Mother   . Drug abuse Mother   . Heart failure Mother   . Hypertension Father   . Alcohol abuse Father   . Drug abuse Father   . Asthma Sister   . COPD Sister     chronic bronchitis  . Depression Sister     ATTEMPTED SUICIDE  . Arrhythmia Sister     needs ablation   . Hypertension Maternal Grandmother   . Diabetes Maternal Grandmother    History  Substance Use Topics  . Smoking status: Never Smoker   . Smokeless tobacco: Never Used  . Alcohol Use: No   OB History   Grav Para Term Preterm Abortions TAB SAB Ect Mult Living  6 5 5  1  1   5      Review of Systems  Constitutional: Negative for fever, chills and fatigue.  Respiratory: Negative for cough and shortness of breath.   Cardiovascular: Positive for chest pain. Negative for palpitations and leg swelling.  Gastrointestinal: Positive for nausea. Negative for vomiting, abdominal pain and diarrhea.  Musculoskeletal: Negative for back pain, myalgias, neck pain and neck stiffness.  Skin: Negative for rash and wound.  Neurological: Positive for headaches. Negative for dizziness, syncope, weakness, light-headedness and numbness.  All other systems reviewed and are negative.    Allergies  Labetalol  Home Medications   Current Outpatient Rx  Name  Route  Sig  Dispense  Refill  . acetaminophen (TYLENOL) 500 MG tablet   Oral   Take 500-1,000 mg by mouth every 6 (six) hours as needed for mild pain, fever or headache.         Marland Kitchen aspirin-acetaminophen-caffeine (EXCEDRIN MIGRAINE) 250-250-65 MG per tablet   Oral   Take 2 tablets  by mouth every 6 (six) hours as needed for headache.         . esomeprazole (NEXIUM) 40 MG capsule   Oral   Take 40 mg by mouth daily before breakfast.          . losartan (COZAAR) 25 MG tablet   Oral   Take 25 mg by mouth every evening.         . naproxen sodium (ANAPROX) 220 MG tablet   Oral   Take 220 mg by mouth 3 (three) times daily as needed (Headache, muscle ache).          BP 138/94  Pulse 68  Temp(Src) 98.2 F (36.8 C)  Resp 16  Ht 5\' 5"  (1.651 m)  SpO2 99%  LMP 04/14/2013 Physical Exam  Nursing note and vitals reviewed. Constitutional: She is oriented to person, place, and time. She appears well-developed and well-nourished. No distress.  HENT:  Head: Normocephalic and atraumatic.  Mouth/Throat: Oropharynx is clear and moist.  Eyes: EOM are normal. Pupils are equal, round, and reactive to light.  Neck: Normal range of motion. Neck supple.  Cardiovascular: Normal rate and regular rhythm.  Exam reveals no gallop and no friction rub.   No murmur heard. Pulmonary/Chest: Effort normal and breath sounds normal. No respiratory distress. She has no wheezes. She has no rales. She exhibits no tenderness.  Abdominal: Soft. Bowel sounds are normal. She exhibits no distension and no mass. There is no tenderness. There is no rebound and no guarding.  Musculoskeletal: Normal range of motion. She exhibits no edema and no tenderness.  No calf swelling or tenderness.  Neurological: She is alert and oriented to person, place, and time.  Patient is alert and oriented x3 with clear, goal oriented speech. Patient has 5/5 motor in all extremities. Sensation is intact to light touch. Patient has a normal gait and walks without assistance.   Skin: Skin is warm and dry. No rash noted. No erythema.  Psychiatric: She has a normal mood and affect. Her behavior is normal.    ED Course  Procedures (including critical care time) Labs Review Labs Reviewed  CBC - Abnormal; Notable for  the following:    Hemoglobin 11.8 (*)    MCH 25.7 (*)    All other components within normal limits  POCT I-STAT, CHEM 8 - Abnormal; Notable for the following:    Potassium 3.1 (*)    All other components within normal limits  URINALYSIS, ROUTINE W REFLEX MICROSCOPIC  POCT I-STAT TROPONIN I   Imaging Review Dg Chest 2 View  04/24/2013   CLINICAL DATA:  Headache. Hypertension.  EXAM: CHEST  2 VIEW  COMPARISON:  01/09/2013.  FINDINGS: Normal sized heart. Clear lungs. Minimal scoliosis.  IMPRESSION: No acute abnormality.   Electronically Signed   By: Gordan Payment M.D.   On: 04/24/2013 22:54    EKG Interpretation     Ventricular Rate:  73 PR Interval:  156 QRS Duration: 85 QT Interval:  433 QTC Calculation: 477 R Axis:   53 Text Interpretation:  Sinus rhythm Borderline prolonged QT interval No significant change since last tracing            MDM  Patient's headache is significantly resolved and she has no chest pain currently. EKG is normal and greater than 6 hour troponin is normal. Her chest pain seems very atypical for coronary artery disease. She is requesting to be discharged home. I advised her that she will need followup with her cardiologist, Dr. Nanetta Batty. Patient has been getting return precautions and states she fully understands them.    Loren Racer, MD 04/24/13 [phone]

## 2013-04-24 NOTE — ED Notes (Signed)
C/o 1 week duration of HA chest discomfort that is not reproducable

## 2013-04-25 ENCOUNTER — Telehealth: Payer: Self-pay | Admitting: Cardiovascular Disease

## 2013-04-25 NOTE — Telephone Encounter (Signed)
She have been taking her blood pressure medicine like she was told to do. Started having real bad headaches and chest pain.Went to Urgent Care on yesterday,Her blood pressure was 170/104.They sent her to [facility] and they told her to follow up with Dr Allyson Sabal,

## 2013-04-25 NOTE — Telephone Encounter (Signed)
Reviewed ED note.  Pt dx w/ migraine HAs and told to f/u with  Dr. Allyson Sabal.  Returned call and pt verified x 2.  Pt stated she went to Urgent Care yesterday for HA and CP and sent to ER.  Asked what she should do about BP b/c it was 170/104 at Urgent Care.  Pt informed per ER note, she is to f/u here and is due to see Vernona Rieger, NP per Dr. Hazle Coca last OV note.  Pt verbalized understanding.  Appt scheduled for 11.14.14 at 3:40pm w/ Vernona Rieger, NP for evaluation.  Pt asked if she should adjust BP med and advised to continue current dose as directed.  Informed provider will be notified for further instructions and RN will call back.  Pt asked that message be left on cell phone w/ instructions.  Judie Grieve, PA-C notified and advised pt continue current dose and keep appt on Friday for adjustment if needed.  Call to cell and left message w/ advice and to call back if questions or concerns.

## 2013-04-28 ENCOUNTER — Encounter: Payer: Self-pay | Admitting: Cardiology

## 2013-04-28 ENCOUNTER — Ambulatory Visit (INDEPENDENT_AMBULATORY_CARE_PROVIDER_SITE_OTHER): Payer: Medicaid Other | Admitting: Cardiology

## 2013-04-28 VITALS — BP 140/100 | HR 76 | Ht 65.0 in | Wt 142.0 lb

## 2013-04-28 DIAGNOSIS — I519 Heart disease, unspecified: Secondary | ICD-10-CM

## 2013-04-28 DIAGNOSIS — R079 Chest pain, unspecified: Secondary | ICD-10-CM

## 2013-04-28 DIAGNOSIS — I10 Essential (primary) hypertension: Secondary | ICD-10-CM

## 2013-04-28 DIAGNOSIS — E876 Hypokalemia: Secondary | ICD-10-CM

## 2013-04-28 MED ORDER — LOSARTAN POTASSIUM 50 MG PO TABS: 50.0000 mg | ORAL_TABLET | Freq: Every day | ORAL | Status: DC

## 2013-04-28 NOTE — Progress Notes (Signed)
04/28/2013   PCP: Janine Limbo, MD   Chief Complaint  Patient presents with  . Follow-up    was in the ER monday for HA and chest pAIN    Primary Cardiologist: Dr. Allyson Sabal  HPI:  27 year old remarried female mother of 5 children is here today for complaints of chest pain/HTN/Headache after ER visit. She has a history of atypical chest pain and orthopnea 8 days postpartum in January of this year she had seen Dr. Allyson Sabal he did not feel the pain was 2 to coronary artery disease but perhaps pericardial effusion and he ordered an echo and PA and lateral chest x-ray. He also placed her on labetalol and hydrochlorothiazide. She developed alopecia with labetalol and stopped her medications. She is continued with chest pain and had her 2-D echo July 17 of this year. The echo was abnormal in that her EF is 45-50% her wall thickness in the LV was normal she had diffuse hypokinesis and left ventricular diastolic function parameters are normal. No pericardial effusion. With BP contol her chest pain went away.   She was unable to take lisinopril due to cough, so Losartan was added.   With her HTN she also has significant headaches and pain in her neck as well. Occasionally with the headache and the neck pain will travel down her left arm. AT times she will have tachycardia also.    She had been doing well with controlled BP until this week and her HTN returned and headache, chest pain and left arm pain. Her blood pressure was elevated in the emergency room her potassium was also low at 3.1. As was replaced and patient was instructed to followup with [place].  It is time for her to have a repeat echo to see if her EF is improved. EKG today was normal. She does have stress in her life currently with issues with her new husband which may be adding to her problems. Her blood pressure here is elevated we gave her 2-1/2 of Bystolic to see if that would help her chest pain and her blood pressure.  Also  she takes her losartan in the evening at suppertime. I discussed the importance of taking BP med in the morning to improve BP control during the daytime  Rather than at night when she is asleep.   Allergies  Allergen Reactions  . Labetalol Other (See Comments)    Alopecia   . Lisinopril     cough    Current Outpatient Prescriptions  Medication Sig Dispense Refill  . acetaminophen (TYLENOL) 500 MG tablet Take 500-1,000 mg by mouth every 6 (six) hours as needed for mild pain, fever or headache.      . esomeprazole (NEXIUM) 40 MG capsule Take 40 mg by mouth daily before breakfast.       . losartan (COZAAR) 50 MG tablet Take 1 tablet (50 mg total) by mouth daily.  30 tablet  6  . metoCLOPramide (REGLAN) 10 MG tablet Take 1 tablet (10 mg total) by mouth every 6 (six) hours as needed for nausea (nausea/headache).  6 tablet  0   No current facility-administered medications for this visit.    Past Medical History  Diagnosis Date  . Hypertension   . Low BMI   . Postpartum hypertension   . Spotting in first trimester     With SAB  . Herpes     cold sores  . GBS carrier     First  pregnancy  . H/O varicella   . Abnormal Pap smear 08/2003    colpo  . Pelvic pain     With pregnancy  . Diastasis recti 12/12/08  . Stress   . H/O chest pain 06/17/11  . Panic attack     HAS BEEN RX'D XANAX  . Right ovarian cyst 2003    RT OOPHRECTOMY  . Preeclampsia 2010,2011    POST PARTUM;WAS PUT ON BP MEDS  . Preterm contractions 2007    GIVEN BETAMETHASONE AND PROCARDIA TO STOP CTXS  . Anemia     FeSO4 SUPP IN PAST  . Headache(784.0)     migraines;D/T BP  . GERD 2011    PRILOSEC TAKEN  . Carpal tunnel syndrome of right wrist   . Palpitations   . LV dysfunction 12/2012    EF 45-50%  . HTN (hypertension)     Past Surgical History  Procedure Laterality Date  . Ovary removed      right   . Dilation and curettage of uterus  2009  . Breast cyst removal  2005    right  . Tubal ligation   07/03/2012    Procedure: POST PARTUM TUBAL LIGATION;  Surgeon: Purcell Nails, MD;  Location: [facility];  Service: Gynecology;  Laterality: Bilateral;  Bilateral post partum tubal ligation    ZOX:WRUEAVW:UJ colds or fevers, no weight changes Skin:no rashes or ulcers HEENT:no blurred vision, no congestion, + headache CV:see HPI PUL:see HPI GI:no diarrhea constipation or melena, no indigestion GU:no hematuria, no dysuria MS:no joint pain, no claudication Neuro:no syncope, no lightheadedness Endo:no diabetes, no thyroid disease  PHYSICAL EXAM BP 140/100  Pulse 76  Ht 5\' 5"  (1.651 m)  Wt 142 lb (64.411 kg)  BMI 23.63 kg/m2  LMP 04/14/2013 General:Pleasant affect, NAD Skin:Warm and dry, brisk capillary refill HEENT:normocephalic, sclera clear, mucus membranes moist Neck:supple, no JVD, no bruits  Heart:S1S2 RRR without murmur, gallup, rub or click Lungs:clear without rales, rhonchi, or wheezes WJX:BJYN, non tender, + BS, do not palpate liver spleen or masses Ext:no lower ext edema, 2+ pedal pulses, 2+ radial pulses Neuro:alert and oriented, MAE, follows commands, + facial symmetry  EKG:SR without acute changes normal EKG  ASSESSMENT AND PLAN Chest pain at rest continued chest pain with continued HTN.  Bystolic 2.5 mg given here in office for one time dose.  Chest pain most likely due to HTN and her hypokalemia.  Will control BP and once controlled plan stress test.  She has a normal EKG.  LV dysfunction, by Echo EF 45-50% Will repeat echo as per Dr. Hazle Coca plan.  Hypertension Poorly controlled.  Though it had been until recently.  Will increase losartan to control BP.   She develops elevated BP, headache and then chest pain.  Also with episodic tachycardia though we were unable to catch on a monitor.  It may be if LV function improved or not to add BB to assist with her symptoms and control BP.   Hypokalemia K+ 3.1 in ER and was given po KCL.  Will recheck labs on Monday.       I will see her back in 2 weeks if pressure is controlled at that time we will order a stress test.  She will then follow with Dr. Allyson Sabal.

## 2013-04-28 NOTE — Assessment & Plan Note (Signed)
Will repeat echo as per Dr. Hazle Coca plan.

## 2013-04-28 NOTE — Patient Instructions (Signed)
Repeat echo of your heart to make sure it has improved.  Have labs done Monday,  See me in 2 weeks for recheck then we will schedule stress test.  Increase your losartan to 50 mg daily, take 2 of your 25 mg tabs until gone then new prescription.  Take your losartan in the morning if you can, it may work better for you.

## 2013-04-28 NOTE — Assessment & Plan Note (Signed)
continued chest pain with continued HTN.  Bystolic 2.5 mg given here in office for one time dose.  Chest pain most likely due to HTN and her hypokalemia.  Will control BP and once controlled plan stress test.  She has a normal EKG.

## 2013-04-28 NOTE — Assessment & Plan Note (Signed)
Poorly controlled.  Though it had been until recently.  Will increase losartan to control BP.   She develops elevated BP, headache and then chest pain.  Also with episodic tachycardia though we were unable to catch on a monitor.  It may be if LV function improved or not to add BB to assist with her symptoms and control BP.

## 2013-04-28 NOTE — Assessment & Plan Note (Signed)
K+ 3.1 in ER and was given po KCL.  Will recheck labs on Monday.

## 2013-05-01 ENCOUNTER — Telehealth: Payer: Self-pay | Admitting: Cardiology

## 2013-05-01 NOTE — Telephone Encounter (Signed)
Left msg for patient to call and schedule echocardiogram.

## 2013-05-05 ENCOUNTER — Telehealth: Payer: Self-pay | Admitting: Cardiovascular Disease

## 2013-05-05 NOTE — Telephone Encounter (Signed)
Returned call.  Pt c/o HA, CP and L arm pain w/ elevated BP.  Stated BP was 139/100 at work.  Stated she has increased losartan as directed by Vernona Rieger.    Wilburt Finlay, PA-C notified and reviewed chart.  Advised otc pain reliever, eat bananas/drink OJ and f/u with PCP.  Stated migraine HA could cause elevated BP.  No medication change.  Pt informed and verbalized understanding and agreed w/ plan.  Will call back on Monday if no change.

## 2013-05-05 NOTE — Telephone Encounter (Signed)
Patient states that she recently saw Nada Boozer, NP and her dosage of Lorartan was increased.  She had her blood pressure checked at work and it is running high.  Please advise.

## 2013-05-05 NOTE — Telephone Encounter (Deleted)
Patient states that she recently saw Nada Boozer, NP  and the dosage of her Losarten wwwas

## 2013-05-17 ENCOUNTER — Encounter (HOSPITAL_COMMUNITY): Payer: Self-pay | Admitting: Emergency Medicine

## 2013-05-17 ENCOUNTER — Telehealth (HOSPITAL_COMMUNITY): Payer: Self-pay | Admitting: *Deleted

## 2013-05-17 ENCOUNTER — Ambulatory Visit (HOSPITAL_COMMUNITY): Payer: BC Managed Care – PPO

## 2013-05-17 ENCOUNTER — Emergency Department (INDEPENDENT_AMBULATORY_CARE_PROVIDER_SITE_OTHER)
Admission: EM | Admit: 2013-05-17 | Discharge: 2013-05-17 | Disposition: A | Discharge status: Home / Self Care | Payer: BC Managed Care – PPO | Source: Home / Self Care | Attending: Emergency Medicine | Admitting: Emergency Medicine

## 2013-05-17 DIAGNOSIS — A088 Other specified intestinal infections: Secondary | ICD-10-CM

## 2013-05-17 DIAGNOSIS — A084 Viral intestinal infection, unspecified: Secondary | ICD-10-CM

## 2013-05-17 LAB — POCT I-STAT, CHEM 8
BUN: 15 mg/dL (ref 6–23)
Chloride: 108 mEq/L (ref 96–112)
Glucose, Bld: 84 mg/dL (ref 70–99)
Hemoglobin: 12.9 g/dL (ref 12.0–15.0)
Sodium: 142 mEq/L (ref 135–145)

## 2013-05-17 LAB — CBC WITH DIFFERENTIAL/PLATELET
Basophils Absolute: 0.1 10*3/uL (ref 0.0–0.1)
Basophils Relative: 1 % (ref 0–1)
Eosinophils Absolute: 0.1 10*3/uL (ref 0.0–0.7)
HCT: 37.1 % (ref 36.0–46.0)
Lymphocytes Relative: 31 % (ref 12–46)
Neutro Abs: 4 10*3/uL (ref 1.7–7.7)
Platelets: 220 10*3/uL (ref 150–400)
RDW: 13.5 % (ref 11.5–15.5)
WBC: 6.3 10*3/uL (ref 4.0–10.5)

## 2013-05-17 MED ORDER — DICYCLOMINE HCL 20 MG PO TABS: ORAL_TABLET | ORAL | Status: DC

## 2013-05-17 MED ORDER — GI COCKTAIL ~~LOC~~
ORAL | Status: AC
  Filled 2013-05-17: qty 30

## 2013-05-17 MED ORDER — ONDANSETRON 4 MG PO TBDP
ORAL_TABLET | ORAL | Status: AC
  Filled 2013-05-17: qty 2

## 2013-05-17 MED ORDER — DIPHENOXYLATE-ATROPINE 2.5-0.025 MG PO TABS: 1.0000 | ORAL_TABLET | Freq: Four times a day (QID) | ORAL | Status: DC | PRN

## 2013-05-17 MED ORDER — ONDANSETRON 4 MG PO TBDP
8.0000 mg | ORAL_TABLET | Freq: Once | ORAL | Status: AC
  Administered 2013-05-17: 8 mg via ORAL

## 2013-05-17 MED ORDER — ONDANSETRON 8 MG PO TBDP: 8.0000 mg | ORAL_TABLET | Freq: Three times a day (TID) | ORAL | Status: DC | PRN

## 2013-05-17 MED ORDER — GI COCKTAIL ~~LOC~~
30.0000 mL | Freq: Once | ORAL | Status: AC
  Administered 2013-05-17: 30 mL via ORAL

## 2013-05-17 NOTE — ED Notes (Signed)
Pt c/o abd pain onset yest Sxs also include: x1 episode of vomiting today and has had x3 loose stools since yest; nauseas, weakness, HA Denies: fevers, cold sxs Alert w/no signs of acute distress.

## 2013-05-17 NOTE — ED Provider Notes (Signed)
Chief Complaint:   Chief Complaint  Patient presents with  . Abdominal Pain    History of Present Illness:   [name] is a 27 year old female who has a history since last night with diarrhea, nausea, vomiting, crampy abdominal pain, generalized weakness, has felt hot and sweaty. She has had 4 diarrheal stools and one episode of vomitus. No blood in the stool or melena. No blood in the vomitus, coffee-ground emesis, or bilious emesis. She denies any fever or URI symptoms. The pain is generalized and crampy. Her son had diarrhea a few days ago. Her only suspicious ingestion was some meatloaf last night. But it was well cooked.  Review of Systems:  Other than noted above, the patient denies any of the following symptoms: Systemic:  No fevers, chills, sweats, weight loss or gain, fatigue, or tiredness. ENT:  No nasal congestion, rhinorrhea, or sore throat. Lungs:  No cough, wheezing, or shortness of breath. Cardiac:  No chest pain, syncope, or presyncope. GI:  No abdominal pain, nausea, vomiting, anorexia, diarrhea, constipation, blood in stool or vomitus. GU:  No dysuria, frequency, or urgency.  PMFSH:  Past medical history, family history, social history, meds, and allergies were reviewed.  She has high blood pressure and takes losartan. She is allergic to labetalol and lisinopril.  Physical Exam:   Vital signs:  BP 138/96  Pulse 77  Temp(Src) 97.9 F (36.6 C) (Oral)  Resp 20  SpO2 99%  LMP 05/11/2013 General:  Alert and oriented.  In no distress.  Skin warm and dry.  Good skin turgor, brisk capillary refill. ENT:  No scleral icterus, moist mucous membranes, no oral lesions, pharynx clear. Lungs:  Breath sounds clear and equal bilaterally.  No wheezes, rales, or rhonchi. Heart:  Rhythm regular, without extrasystoles.  No gallops or murmers. Abdomen:  Soft, flat, nondistended. No organomegaly or mass. Bowel sounds are normally active. She has mild epigastric tenderness to palpation  without guarding or rebound. There is also mild suprapubic tenderness to palpation. She has no tenderness in the right or left lower cautery this or the upper quadrants. Murphy sign and Murphy's punch were negative. Skin: Clear, warm, and dry.  Good turgor.  Brisk capillary refill.  Labs:   Results for orders placed during the hospital encounter of 05/17/13  CBC WITH DIFFERENTIAL      Result Value Range   WBC 6.3  4.0 - 10.5 K/uL   RBC 4.73  3.87 - 5.11 MIL/uL   Hemoglobin 12.1  12.0 - 15.0 g/dL   HCT 65.7  84.6 - 96.2 %   MCV 78.4  78.0 - 100.0 fL   MCH 25.6 (*) 26.0 - 34.0 pg   MCHC 32.6  30.0 - 36.0 g/dL   RDW 95.2  84.1 - 32.4 %   Platelets 220  150 - 400 K/uL   Neutrophils Relative % 63  43 - 77 %   Neutro Abs 4.0  1.7 - 7.7 K/uL   Lymphocytes Relative 31  12 - 46 %   Lymphs Abs 1.9  0.7 - 4.0 K/uL   Monocytes Relative 5  3 - 12 %   Monocytes Absolute 0.3  0.1 - 1.0 K/uL   Eosinophils Relative 1  0 - 5 %   Eosinophils Absolute 0.1  0.0 - 0.7 K/uL   Basophils Relative 1  0 - 1 %   Basophils Absolute 0.1  0.0 - 0.1 K/uL  POCT I-STAT, CHEM 8      Result Value  Range   Sodium 142  135 - 145 mEq/L   Potassium 3.8  3.5 - 5.1 mEq/L   Chloride 108  96 - 112 mEq/L   BUN 15  6 - 23 mg/dL   Creatinine, Ser 1.61  0.50 - 1.10 mg/dL   Glucose, Bld 84  70 - 99 mg/dL   Calcium, Ion 0.96  0.45 - 1.23 mmol/L   TCO2 24  0 - 100 mmol/L   Hemoglobin 12.9  12.0 - 15.0 g/dL   HCT 40.9  81.1 - 91.4 %     Course in Urgent Care Center:   Given 30 cc of GI cocktail and Zofran ODT 8 mg sublingually.  Assessment:  The encounter diagnosis was Viral gastroenteritis.  Plan:   1.  Meds:  The following meds were prescribed:   Discharge Medication List as of 05/17/2013 11:33 AM    START taking these medications   Details  dicyclomine (BENTYL) 20 MG tablet 1 every 6 hours as needed for crampy abdominal pain, Normal    diphenoxylate-atropine (LOMOTIL) 2.5-0.025 MG per tablet Take 1 tablet by mouth  4 (four) times daily as needed for diarrhea or loose stools., Starting 05/17/2013, Until Discontinued, Print    ondansetron (ZOFRAN ODT) 8 MG disintegrating tablet Take 1 tablet (8 mg total) by mouth every 8 (eight) hours as needed for nausea., Starting 05/17/2013, Until Discontinued, Normal        2.  Patient Education/Counseling:  The patient was given appropriate handouts, self care instructions, and instructed in symptomatic relief. The patient was told to stay on clear liquids for the remainder of the day, then advance to a B.R.A.T. diet starting tomorrow.  3.  Follow up:  The patient was told to follow up if no better in 3 to 4 days, if becoming worse in any way, and given some red flag symptoms such as worsening abdominal pain, fever, persistent vomiting, or any evidence of GI bleeding which would prompt immediate return.  Follow up here as needed.       Reuben Likes, MD 05/17/13 1520

## 2013-06-12 ENCOUNTER — Telehealth: Payer: Self-pay | Admitting: Cardiovascular Disease

## 2013-06-12 NOTE — Telephone Encounter (Signed)
Returned call.  Pt c/o HA and CP on Christmas Day and today. Stated BP today was 163/103.  Pt stated she was supposed to get the echo done and has been playing phone tag with the scheduler.  Wants to schedule that now.  Pt transferred to [facility] in Scheduling and informed RN will notify Dr. Allyson Sabal for further instructions.    Dr. Allyson Sabal notified and advised pt have echo done as recommended and f/u with [facility].  Pt informed.  Echo was scheduled for 1.8.15.  Pt unable to f/u until 1.14.15 at 4pm w/ Corine Shelter, PA-C.  Pt agreed w/ plan and to keep appt and be on time.

## 2013-06-12 NOTE — Telephone Encounter (Signed)
Please call-been having terrible headache and a little pressure in her chest. BP today is 163/103.

## 2013-06-22 ENCOUNTER — Telehealth: Payer: Self-pay | Admitting: Cardiovascular Disease

## 2013-06-22 ENCOUNTER — Ambulatory Visit (HOSPITAL_COMMUNITY)
Admission: RE | Admit: 2013-06-22 | Discharge: 2013-06-22 | Disposition: A | Discharge status: Home / Self Care | Payer: BC Managed Care – PPO | Source: Ambulatory Visit | Attending: Cardiovascular Disease | Admitting: Cardiovascular Disease

## 2013-06-22 DIAGNOSIS — R079 Chest pain, unspecified: Secondary | ICD-10-CM | POA: Insufficient documentation

## 2013-06-22 DIAGNOSIS — I519 Heart disease, unspecified: Secondary | ICD-10-CM

## 2013-06-22 DIAGNOSIS — I10 Essential (primary) hypertension: Secondary | ICD-10-CM | POA: Insufficient documentation

## 2013-06-22 NOTE — Telephone Encounter (Signed)
Note can be written, but cannot be faxed w/o ROI on file.  Pt can pick up or it can be mailed.  Returned call to pt.  Left message for pt to call back and page RN when she calls.

## 2013-06-22 NOTE — Telephone Encounter (Signed)
Pt called per answering service. She need a note for work.Please fax to [phone]

## 2013-06-22 NOTE — Telephone Encounter (Signed)
Pt called back and informed letter is ready for pick up as it cannot be faxed w/o a ROI.  Pt verbalized understanding and agreed w/ plan.  Pt will pick up tomorrow.

## 2013-06-22 NOTE — Progress Notes (Signed)
2D Echo Performed 06/22/2013    Ardit Danh, RCS  

## 2013-06-23 ENCOUNTER — Telehealth: Payer: Self-pay | Admitting: Cardiology

## 2013-06-28 ENCOUNTER — Ambulatory Visit (INDEPENDENT_AMBULATORY_CARE_PROVIDER_SITE_OTHER): Payer: BC Managed Care – PPO | Admitting: Cardiology

## 2013-06-28 ENCOUNTER — Encounter: Payer: Self-pay | Admitting: Cardiology

## 2013-06-28 VITALS — BP 116/90 | HR 88 | Ht 65.0 in | Wt 140.5 lb

## 2013-06-28 DIAGNOSIS — F419 Anxiety disorder, unspecified: Secondary | ICD-10-CM | POA: Insufficient documentation

## 2013-06-28 DIAGNOSIS — I10 Essential (primary) hypertension: Secondary | ICD-10-CM

## 2013-06-28 DIAGNOSIS — I519 Heart disease, unspecified: Secondary | ICD-10-CM

## 2013-06-28 DIAGNOSIS — F411 Generalized anxiety disorder: Secondary | ICD-10-CM

## 2013-06-28 NOTE — Patient Instructions (Addendum)
Have you B/P checked once or twice a day for the next 2 weeks then return to review this. Low salt diet

## 2013-06-28 NOTE — Assessment & Plan Note (Signed)
She has had high B/P readings at work.

## 2013-06-28 NOTE — Assessment & Plan Note (Signed)
?   contributing to her high B/P readings at work

## 2013-06-28 NOTE — Progress Notes (Signed)
06/28/2013 [name]   Mar 07, 1986  [id number]  Primary Physicia Eli Hose, MD Primary Cardiologist: Dr [name]  HPI:  Pleasant 28 y/o AA female with 6 children. She works at [employer]. We saw in January 2014 with post partum chest pain and HTN. She had an echo in July  That showed her EF to be 45-50%. She was continuing to have chest pain. She admits she is a "nervous person" and is under stress at home and at work. She tells me she gets headaches at work and has them check her B/P and its elevated which makes her concerned. She says her B/P has been as high 170/110. She is here today for further evaluation and to review her echo. Fortunately , her echo shows her EF is now WNL 50-55%. Her B/P by me in the office is 138/84.   Current Outpatient Prescriptions  Medication Sig Dispense Refill  . acetaminophen (TYLENOL) 500 MG tablet Take 500-1,000 mg by mouth every 6 (six) hours as needed for mild pain, fever or headache.      . ibuprofen (ADVIL,MOTRIN) 600 MG tablet Take 1 tablet by mouth as needed.      Marland Kitchen losartan (COZAAR) 50 MG tablet Take 1 tablet (50 mg total) by mouth daily.  30 tablet  6   No current facility-administered medications for this visit.    Allergies  Allergen Reactions  . Labetalol Other (See Comments)    Alopecia   . Lisinopril     cough    History   Social History  . Marital Status: Divorced    Spouse Name: N/A    Number of Children: 87  . Years of Education: 15   Occupational History  . STUDENT    Social History Main Topics  . Smoking status: Never Smoker   . Smokeless tobacco: Never Used  . Alcohol Use: No  . Drug Use: No  . Sexual Activity: Yes    Partners: Male    Birth Control/ Protection: Surgical     Comment: tubal   Other Topics Concern  . Not on file   Social History Narrative  . No narrative on file     Review of Systems: General: negative for chills, fever, night sweats or weight changes.  Cardiovascular:  negative for chest pain, dyspnea on exertion, edema, orthopnea, palpitations, paroxysmal nocturnal dyspnea or shortness of breath Dermatological: negative for rash Respiratory: negative for cough or wheezing Urologic: negative for hematuria Abdominal: negative for nausea, vomiting, diarrhea, bright red blood per rectum, melena, or hematemesis Neurologic: negative for visual changes, syncope, or dizziness All other systems reviewed and are otherwise negative except as noted above.    Blood pressure 116/90, pulse 88, height 5\' 5"  (1.651 m), weight 140 lb 8 oz (63.73 kg).  General appearance: alert, cooperative and no distress Lungs: clear to auscultation bilaterally Heart: regular rate and rhythm Extremities: no edema  EKG NSR without acute changes  ASSESSMENT AND PLAN:   Hypertension She has had high B/P readings at work.  LV dysfunction, by Echo EF 45-50% Resolved by Jan 2015 echo- EF 50-55%  Anxiety ? contributing to her high B/P readings at work   PLAN  I asked Ms [name] to have her B/P checked once or twice a day and record it, not just when she has her headaches. I have asked her to go on a low salt diet. She'll return in two weeks if her B/P reading suggest she needs further evaluation. She did tell me  both parents have HTN.  [facility] KPA-C 06/28/2013 4:45 PM

## 2013-06-28 NOTE — Assessment & Plan Note (Signed)
Resolved by Jan 2015 echo- EF 50-55%

## 2013-06-29 ENCOUNTER — Encounter: Payer: Self-pay | Admitting: Cardiology

## 2013-07-06 NOTE — Telephone Encounter (Signed)
none

## 2013-08-17 ENCOUNTER — Other Ambulatory Visit: Payer: Self-pay

## 2013-09-28 ENCOUNTER — Encounter (HOSPITAL_COMMUNITY): Payer: Self-pay | Admitting: Pharmacist

## 2013-10-02 ENCOUNTER — Other Ambulatory Visit: Payer: Self-pay | Admitting: Obstetrics and Gynecology

## 2013-10-05 ENCOUNTER — Other Ambulatory Visit (HOSPITAL_COMMUNITY): Payer: Self-pay | Admitting: Obstetrics and Gynecology

## 2013-10-05 NOTE — H&P (Signed)
[name] is a 28 y.o. female P 5-0-1-5 S/P  tubal sterilization,  presents for removal of a hydrosalpinx and endometrial ablation because of menorrhagia for the past year.   Though periods are monthly and only last for 3 days, the patient has to change double protection 10 times a day and gets up several times at nigh to change to prevent soiling her linen. This flow is accompanied by a throbbing lower back pain that radiates to her thighs and is rated at a  7/10 on a 10 point pain scale.  Pain is only responsive to Tylenol #3 as it will decrease pain to 2/10.  She denies any dyspareunia, intermenstrual bleeding or changes in bowel/bladder function (states she's always constipated). In March 2015 the patient had a normal CBC, TSH and CMET.   Her endometrial biopsy showed benign secretory endometrium with no hyperplasia, atypia or malignancy.  An ultrasound at that same time showed a uterus-6.3 x 5.8 x 4.8 cm, a surgically absent right ovary with an adnexal tubular structure ? hydrosalpinx; left ovary-3.30  x 2.18 x 1.45 cm; uterine length from fundus to external os-9.3 cm and cavity width-3.6 cm and length-5.4 cm.  After consideration of both medical and surgical management options for her symptoms,  the patient has decided to proceed with an endometrial biopsy to manage her menorrhagia and removal of hydrosalpinx.   Past Medical History  OB History: G 6;  P 5-0-1-5;  SVB  GYN History: LMP: 4/10/2015Contr acepton: Tubal Sterilization;  Denies history of STDs or abnormal PAP smear;  Last PAP smear: 2013  Medical History: Anxiety/Panic Attacks, PTSD, Depression, Migraine,  Pregnancy Induced Hypertension,  Right Carpal Tunnel Syndrome, GERD, Anemia, Renal Stones,   Surgical History:  2014  Tubal Sterilization;   Right Salpingo-oophorectomy (Dermoid Cyst);   2009  D & C;    2005  Right Breast Biopsy-benign Denies problems with anesthesia or history of blood transfusions  Family History: Hypertension,   HIV, CHF, Alcoholism, Anemia, Asthma, Depression and Diabetes Mellitus  Social History:  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 Labetalol Other (See Comments)    Alopecia   . Lisinopril Cough    cough    VOJ:JKKXFG to occasional shortness of breath  and palpitations with panic attacks and constipation,  but  denies headache, vision changes, nasal congestion, dysphagia, tinnitus, dizziness, hoarseness, cough,  chest pain,  nausea, vomiting, diarrhea, urinary frequency, urgency  dysuria, hematuria, vaginitis symptoms, swelling of joints,easy bruising,  myalgias, arthralgias, skin rashes, unexplained weight loss and except as is mentioned in the history of present illness, patient's review of systems is otherwise negative.  Physical Exam  Bp: 132/80  Weight: 139 lbs.  Height: 5\' 5"    BMI: 23.1  Neck: supple without masses or thyromegaly Lungs: clear to auscultation Heart: regular rate and rhythm Abdomen: soft, non-tender and no organomegaly Pelvic:EGBUS- wnl; vagina-normal rugae; uterus-normal size, cervix without lesions or motion tenderness; adnexae-mild left tenderness but no r masses Extremities:  no clubbing, cyanosis or edema   Assesment:  Menorrhagia            Dysmenorrhea            Hydrosalpinx    Disposition:  A discussion was held with patient regarding the indication for her procedure(s) along with the risks, which include but are  not limited to: reaction to anesthesia, damage to adjacent organs, infection and excessive bleeding. the patient verbalized understanding of these risks and has consented to proceed with Laparoscopic Removal of Hydrosalpinx,  Possible Exploratory Laparotomy, Hysteroscopy, Dilatation, Curettage and Thermachoice Endometrial Ablation.at [facility] on October 11, 2013   CSN# [id number]    Dezire Turk J. Florene Glen, PA-C  for Dr. Franklyn Lor. Dillard

## 2013-10-09 ENCOUNTER — Encounter (HOSPITAL_COMMUNITY)
Admission: RE | Admit: 2013-10-09 | Discharge: 2013-10-09 | Disposition: A | Discharge status: Home / Self Care | Payer: BC Managed Care – PPO | Source: Ambulatory Visit | Attending: Obstetrics and Gynecology | Admitting: Obstetrics and Gynecology

## 2013-10-09 ENCOUNTER — Encounter (HOSPITAL_COMMUNITY): Payer: Self-pay

## 2013-10-09 HISTORY — DX: Personal history of urinary calculi: Z87.442

## 2013-10-09 LAB — CBC
HCT: 38.4 % (ref 36.0–46.0)
HEMOGLOBIN: 12.4 g/dL (ref 12.0–15.0)
MCH: 25.4 pg — ABNORMAL LOW (ref 26.0–34.0)
MCHC: 32.3 g/dL (ref 30.0–36.0)
MCV: 78.7 fL (ref 78.0–100.0)
Platelets: 210 10*3/uL (ref 150–400)
RBC: 4.88 MIL/uL (ref 3.87–5.11)
RDW: 13.9 % (ref 11.5–15.5)
WBC: 8.9 10*3/uL (ref 4.0–10.5)

## 2013-10-09 LAB — BASIC METABOLIC PANEL
BUN: 15 mg/dL (ref 6–23)
CO2: 27 mEq/L (ref 19–32)
Calcium: 9.3 mg/dL (ref 8.4–10.5)
Chloride: 102 mEq/L (ref 96–112)
Creatinine, Ser: 0.79 mg/dL (ref 0.50–1.10)
GFR calc non Af Amer: >90 mL/min (ref >90)
Glucose, Bld: 86 mg/dL (ref 70–99)
POTASSIUM: 4 meq/L (ref 3.7–5.3)
SODIUM: 140 meq/L (ref 137–147)

## 2013-10-09 NOTE — Patient Instructions (Addendum)
   Your procedure is scheduled on:  Wednesday, April 29  Enter through the [employer] of [facility] at:  8 AM Pick up the phone at the desk and dial [phone] and inform us of your arrival.  Please call this number if you have any problems the morning of surgery: [phone]  Remember: Do not eat or drink food after midnight: Tuesday Take these medicines the morning of surgery with a SIP OF WATER:  Cozaar, zantac  Do not wear jewelry, make-up, or FINGER nail polish No metal in your hair or on your body. Do not wear lotions, powders, perfumes.  You may wear deodorant.  Do not bring valuables to the hospital. Contacts, dentures or bridgework may not be worn into surgery.  Patients discharged on the day of surgery will not be allowed to drive home.  Home with husband Taraus  cell [phone].

## 2013-10-11 ENCOUNTER — Encounter (HOSPITAL_COMMUNITY): Payer: Self-pay | Admitting: Anesthesiology

## 2013-10-11 ENCOUNTER — Encounter (HOSPITAL_COMMUNITY)
Admission: RE | Disposition: A | Discharge status: Home / Self Care | Payer: Self-pay | Source: Ambulatory Visit | Attending: Obstetrics and Gynecology

## 2013-10-11 ENCOUNTER — Ambulatory Visit (HOSPITAL_COMMUNITY)
Admission: RE | Admit: 2013-10-11 | Discharge: 2013-10-11 | Disposition: A | Discharge status: Home / Self Care | Payer: BC Managed Care – PPO | Source: Ambulatory Visit | Attending: Obstetrics and Gynecology | Admitting: Obstetrics and Gynecology

## 2013-10-11 ENCOUNTER — Ambulatory Visit (HOSPITAL_COMMUNITY): Payer: BC Managed Care – PPO | Admitting: Anesthesiology

## 2013-10-11 ENCOUNTER — Encounter (HOSPITAL_COMMUNITY): Payer: BC Managed Care – PPO | Admitting: Anesthesiology

## 2013-10-11 DIAGNOSIS — N946 Dysmenorrhea, unspecified: Secondary | ICD-10-CM | POA: Insufficient documentation

## 2013-10-11 DIAGNOSIS — K219 Gastro-esophageal reflux disease without esophagitis: Secondary | ICD-10-CM | POA: Insufficient documentation

## 2013-10-11 DIAGNOSIS — D279 Benign neoplasm of unspecified ovary: Secondary | ICD-10-CM

## 2013-10-11 DIAGNOSIS — I10 Essential (primary) hypertension: Secondary | ICD-10-CM | POA: Insufficient documentation

## 2013-10-11 DIAGNOSIS — F329 Major depressive disorder, single episode, unspecified: Secondary | ICD-10-CM | POA: Insufficient documentation

## 2013-10-11 DIAGNOSIS — F3289 Other specified depressive episodes: Secondary | ICD-10-CM | POA: Insufficient documentation

## 2013-10-11 DIAGNOSIS — D649 Anemia, unspecified: Secondary | ICD-10-CM | POA: Insufficient documentation

## 2013-10-11 DIAGNOSIS — N7013 Chronic salpingitis and oophoritis: Secondary | ICD-10-CM | POA: Insufficient documentation

## 2013-10-11 DIAGNOSIS — N920 Excessive and frequent menstruation with regular cycle: Secondary | ICD-10-CM

## 2013-10-11 HISTORY — PX: LAPAROSCOPY: SHX197

## 2013-10-11 HISTORY — PX: DILITATION & CURRETTAGE/HYSTROSCOPY WITH THERMACHOICE ABLATION: SHX5569

## 2013-10-11 LAB — PREGNANCY, URINE: Preg Test, Ur: NEGATIVE

## 2013-10-11 SURGERY — LAPAROSCOPY OPERATIVE
Anesthesia: General | Site: Uterus

## 2013-10-11 MED ORDER — MEPERIDINE HCL 25 MG/ML IJ SOLN: 6.2500 mg | INTRAMUSCULAR | Status: DC | PRN

## 2013-10-11 MED ORDER — DEXTROSE 5 % IV SOLN
INTRAVENOUS | Status: DC | PRN
  Administered 2013-10-11: 1000 mL via INTRAVENOUS

## 2013-10-11 MED ORDER — DEXAMETHASONE SODIUM PHOSPHATE 10 MG/ML IJ SOLN
INTRAMUSCULAR | Status: DC | PRN
  Administered 2013-10-11: 5 mg via INTRAVENOUS

## 2013-10-11 MED ORDER — LIDOCAINE HCL 2 % IJ SOLN
INTRAMUSCULAR | Status: DC | PRN
  Administered 2013-10-11: 10 mL

## 2013-10-11 MED ORDER — GLYCOPYRROLATE 0.2 MG/ML IJ SOLN
INTRAMUSCULAR | Status: AC
  Filled 2013-10-11: qty 3

## 2013-10-11 MED ORDER — MIDAZOLAM HCL 2 MG/2ML IJ SOLN
INTRAMUSCULAR | Status: DC | PRN
  Administered 2013-10-11: 2 mg via INTRAVENOUS

## 2013-10-11 MED ORDER — KETOROLAC TROMETHAMINE 30 MG/ML IJ SOLN
INTRAMUSCULAR | Status: AC
  Filled 2013-10-11: qty 1

## 2013-10-11 MED ORDER — LIDOCAINE HCL (CARDIAC) 20 MG/ML IV SOLN
INTRAVENOUS | Status: DC | PRN
  Administered 2013-10-11: 50 mg via INTRAVENOUS
  Administered 2013-10-11: 10 mg via INTRAVENOUS

## 2013-10-11 MED ORDER — LIDOCAINE HCL 2 % IJ SOLN
INTRAMUSCULAR | Status: AC
  Filled 2013-10-11: qty 20

## 2013-10-11 MED ORDER — HEPARIN SODIUM (PORCINE) 5000 UNIT/ML IJ SOLN
INTRAMUSCULAR | Status: AC
  Filled 2013-10-11: qty 1

## 2013-10-11 MED ORDER — FENTANYL CITRATE 0.05 MG/ML IJ SOLN
INTRAMUSCULAR | Status: AC
  Filled 2013-10-11: qty 5

## 2013-10-11 MED ORDER — GLYCOPYRROLATE 0.2 MG/ML IJ SOLN
INTRAMUSCULAR | Status: DC | PRN
  Administered 2013-10-11: 0.6 mg via INTRAVENOUS

## 2013-10-11 MED ORDER — FENTANYL CITRATE 0.05 MG/ML IJ SOLN
INTRAMUSCULAR | Status: AC
  Filled 2013-10-11: qty 2

## 2013-10-11 MED ORDER — ONDANSETRON HCL 4 MG/2ML IJ SOLN
INTRAMUSCULAR | Status: DC | PRN
  Administered 2013-10-11: 4 mg via INTRAVENOUS

## 2013-10-11 MED ORDER — FENTANYL CITRATE 0.05 MG/ML IJ SOLN
25.0000 ug | INTRAMUSCULAR | Status: DC | PRN
  Administered 2013-10-11: 25 ug via INTRAVENOUS
  Administered 2013-10-11: 50 ug via INTRAVENOUS

## 2013-10-11 MED ORDER — PROPOFOL 10 MG/ML IV BOLUS
INTRAVENOUS | Status: DC | PRN
Start: 2013-10-11 — End: 2013-10-11
  Administered 2013-10-11: 150 mg via INTRAVENOUS
  Administered 2013-10-11: 50 mg via INTRAVENOUS

## 2013-10-11 MED ORDER — ONDANSETRON HCL 4 MG/2ML IJ SOLN
INTRAMUSCULAR | Status: AC
  Filled 2013-10-11: qty 2

## 2013-10-11 MED ORDER — MIDAZOLAM HCL 2 MG/2ML IJ SOLN
INTRAMUSCULAR | Status: AC
  Filled 2013-10-11: qty 2

## 2013-10-11 MED ORDER — BUPIVACAINE HCL (PF) 0.25 % IJ SOLN
INTRAMUSCULAR | Status: DC | PRN
  Administered 2013-10-11: 30 mL
  Administered 2013-10-11: 5 mL

## 2013-10-11 MED ORDER — ROCURONIUM BROMIDE 100 MG/10ML IV SOLN
INTRAVENOUS | Status: AC
  Filled 2013-10-11: qty 1

## 2013-10-11 MED ORDER — MIDAZOLAM HCL 2 MG/2ML IJ SOLN: 0.5000 mg | Freq: Once | INTRAMUSCULAR | Status: DC | PRN

## 2013-10-11 MED ORDER — OXYCODONE-ACETAMINOPHEN 5-325 MG PO TABS
1.0000 | ORAL_TABLET | ORAL | Status: DC | PRN
Start: 2013-10-11 — End: 2013-10-19

## 2013-10-11 MED ORDER — LACTATED RINGERS IR SOLN
Status: DC | PRN
  Administered 2013-10-11: 3000 mL

## 2013-10-11 MED ORDER — NEOSTIGMINE METHYLSULFATE 1 MG/ML IJ SOLN
INTRAMUSCULAR | Status: DC | PRN
  Administered 2013-10-11: 3 mg via INTRAVENOUS

## 2013-10-11 MED ORDER — PROPOFOL 10 MG/ML IV EMUL
INTRAVENOUS | Status: AC
  Filled 2013-10-11: qty 20

## 2013-10-11 MED ORDER — SILVER NITRATE-POT NITRATE 75-25 % EX MISC
CUTANEOUS | Status: AC
  Filled 2013-10-11: qty 1

## 2013-10-11 MED ORDER — FENTANYL CITRATE 0.05 MG/ML IJ SOLN
INTRAMUSCULAR | Status: DC | PRN
  Administered 2013-10-11 (×2): 50 ug via INTRAVENOUS
  Administered 2013-10-11 (×3): 100 ug via INTRAVENOUS
  Administered 2013-10-11: 50 ug via INTRAVENOUS

## 2013-10-11 MED ORDER — BUPIVACAINE HCL (PF) 0.25 % IJ SOLN
INTRAMUSCULAR | Status: AC
  Filled 2013-10-11: qty 30

## 2013-10-11 MED ORDER — ROCURONIUM BROMIDE 100 MG/10ML IV SOLN
INTRAVENOUS | Status: DC | PRN
  Administered 2013-10-11: 50 mg via INTRAVENOUS
  Administered 2013-10-11: 10 mg via INTRAVENOUS

## 2013-10-11 MED ORDER — KETOROLAC TROMETHAMINE 30 MG/ML IJ SOLN: 15.0000 mg | Freq: Once | INTRAMUSCULAR | Status: DC | PRN

## 2013-10-11 MED ORDER — KETOROLAC TROMETHAMINE 30 MG/ML IJ SOLN
INTRAMUSCULAR | Status: DC | PRN
  Administered 2013-10-11: 30 mg via INTRAVENOUS

## 2013-10-11 MED ORDER — METOCLOPRAMIDE HCL 5 MG/ML IJ SOLN
INTRAMUSCULAR | Status: AC
  Filled 2013-10-11: qty 2

## 2013-10-11 MED ORDER — PROMETHAZINE HCL 25 MG/ML IJ SOLN: 6.2500 mg | INTRAMUSCULAR | Status: DC | PRN

## 2013-10-11 MED ORDER — LACTATED RINGERS IV SOLN
INTRAVENOUS | Status: DC
  Administered 2013-10-11 (×3): via INTRAVENOUS

## 2013-10-11 MED ORDER — IBUPROFEN 600 MG PO TABS: ORAL_TABLET | ORAL | Status: DC

## 2013-10-11 MED ORDER — LIDOCAINE HCL (CARDIAC) 20 MG/ML IV SOLN
INTRAVENOUS | Status: AC
  Filled 2013-10-11: qty 5

## 2013-10-11 MED ORDER — METOCLOPRAMIDE HCL 5 MG/ML IJ SOLN
10.0000 mg | Freq: Once | INTRAMUSCULAR | Status: AC
  Administered 2013-10-11: 10 mg via INTRAVENOUS

## 2013-10-11 SURGICAL SUPPLY — 52 items
ADH SKN CLS APL DERMABOND .7 (GAUZE/BANDAGES/DRESSINGS) ×2
BAG SPEC RTRVL LRG 6X4 10 (ENDOMECHANICALS)
BARRIER ADHS 3X4 INTERCEED (GAUZE/BANDAGES/DRESSINGS) IMPLANT
BRR ADH 4X3 ABS CNTRL BYND (GAUZE/BANDAGES/DRESSINGS)
CABLE HIGH FREQUENCY MONO STRZ (ELECTRODE) IMPLANT
CANISTER SUCT 3000ML (MISCELLANEOUS) ×4 IMPLANT
CATH ROBINSON RED A/P 16FR (CATHETERS) ×4 IMPLANT
CATH THERMACHOICE III (CATHETERS) ×4 IMPLANT
CHLORAPREP W/TINT 26ML (MISCELLANEOUS) ×4 IMPLANT
CLOTH BEACON ORANGE TIMEOUT ST (SAFETY) ×4 IMPLANT
CONTAINER PREFILL 10% NBF 60ML (FORM) ×8 IMPLANT
DERMABOND ADVANCED (GAUZE/BANDAGES/DRESSINGS) ×2
DERMABOND ADVANCED .7 DNX12 (GAUZE/BANDAGES/DRESSINGS) ×2 IMPLANT
DRAPE HYSTEROSCOPY (DRAPE) ×4 IMPLANT
DRSG TELFA 3X8 NADH (GAUZE/BANDAGES/DRESSINGS) ×4 IMPLANT
DRSG VASELINE 3X18 (GAUZE/BANDAGES/DRESSINGS) IMPLANT
EVACUATOR SMOKE 8.L (FILTER) ×2 IMPLANT
FORCEPS CUTTING 33CM 5MM (CUTTING FORCEPS) ×2 IMPLANT
FORCEPS CUTTING 45CM 5MM (CUTTING FORCEPS) IMPLANT
GLOVE BIO SURGEON STRL SZ 6.5 (GLOVE) ×3 IMPLANT
GLOVE BIO SURGEONS STRL SZ 6.5 (GLOVE) ×1
GLOVE BIOGEL PI IND STRL 7.0 (GLOVE) ×4 IMPLANT
GLOVE BIOGEL PI INDICATOR 7.0 (GLOVE) ×4
GOWN STRL REUS W/TWL LRG LVL3 (GOWN DISPOSABLE) ×8 IMPLANT
MANIPULATOR UTERINE 4.5 ZUMI (MISCELLANEOUS) IMPLANT
NDL SPNL 22GX3.5 QUINCKE BK (NEEDLE) ×1 IMPLANT
NEEDLE SPNL 22GX3.5 QUINCKE BK (NEEDLE) ×4 IMPLANT
NS IRRIG 1000ML POUR BTL (IV SOLUTION) ×4 IMPLANT
PACK LAPAROSCOPY BASIN (CUSTOM PROCEDURE TRAY) ×4 IMPLANT
PACK VAGINAL MINOR WOMEN LF (CUSTOM PROCEDURE TRAY) ×4 IMPLANT
PAD DRESSING TELFA 3X8 NADH (GAUZE/BANDAGES/DRESSINGS) ×2 IMPLANT
PAD OB MATERNITY 4.3X12.25 (PERSONAL CARE ITEMS) ×4 IMPLANT
POUCH SPECIMEN RETRIEVAL 10MM (ENDOMECHANICALS) IMPLANT
PROTECTOR NERVE ULNAR (MISCELLANEOUS) ×4 IMPLANT
SET IRRIG TUBING LAPAROSCOPIC (IRRIGATION / IRRIGATOR) ×2 IMPLANT
SET TUBING HYSTEROSCOPY 2 NDL (TUBING) ×3 IMPLANT
SHEARS HARMONIC ACE PLUS 36CM (ENDOMECHANICALS) IMPLANT
SOLUTION ELECTROLUBE (MISCELLANEOUS) IMPLANT
SPONGE SURGIFOAM ABS GEL 12-7 (HEMOSTASIS) ×3 IMPLANT
SUT MNCRL AB 3-0 PS2 27 (SUTURE) ×4 IMPLANT
SUT VICRYL 0 ENDOLOOP (SUTURE) IMPLANT
SUT VICRYL 0 UR6 27IN ABS (SUTURE) ×8 IMPLANT
SYR 20CC LL (SYRINGE) ×4 IMPLANT
SYR CONTROL 10ML LL (SYRINGE) ×4 IMPLANT
TOWEL OR 17X24 6PK STRL BLUE (TOWEL DISPOSABLE) ×8 IMPLANT
TRAY FOLEY CATH 14FR (SET/KITS/TRAYS/PACK) ×4 IMPLANT
TROCAR BALLN 12MMX100 BLUNT (TROCAR) ×4 IMPLANT
TROCAR BLADELESS OPT 5 100 (ENDOMECHANICALS) ×4 IMPLANT
TROCAR XCEL NON-BLD 5MMX100MML (ENDOMECHANICALS) ×8 IMPLANT
TUBE HYSTEROSCOPY W Y-CONNECT (TUBING) ×3 IMPLANT
WARMER LAPAROSCOPE (MISCELLANEOUS) ×4 IMPLANT
WATER STERILE IRR 1000ML POUR (IV SOLUTION) ×4 IMPLANT

## 2013-10-11 NOTE — Interval H&P Note (Signed)
History and Physical Interval Note:  10/11/2013 9:37 AM  [name]  has presented today for surgery, with the diagnosis of adnexal mass  The various methods of treatment have been discussed with the patient and family. After consideration of risks, benefits and other options for treatment, the patient has consented to  Procedure(s): LAPAROSCOPY OPERATIVE WITH REMOVAL OF ADNEXAL MASS  (N/A) DILATATION & CURETTAGE/HYSTEROSCOPY WITH THERMACHOICE ABLATION (N/A) as a surgical intervention .  The patient's history has been reviewed, patient examined, no change in status, stable for surgery.  I have reviewed the patient's chart and labs.  Questions were answered to the patient's satisfaction.     Donia Yokum A Shyler Hamill

## 2013-10-11 NOTE — Op Note (Signed)
Pre op DX: ADNEXAL MASS and menorrhagia   Post Op TI:WPYK dermoid cyst and menorrhagia    Procedure D&C hysteroscopy  With thermachoice endometrial ablation and Laparoscopic left ovarian cystectomy.    PHYSICIAN : Promise Weldin   ASSISTANTS: Earnstine Regal PA  ANESTHESIA:   General  and paracervical block  ESTIMATED BLOOD LOSS: 50cc  LOCAL MEDICATIONS USED:  LIDOCAINE 10CC  Used for cervical block.  10 cc .25% marcaine used for local on the abdomen  SPECIMEN:  Source of Specimen:  endometrial curettings, dermoid cysts, cyst wall and paratubal cysts  DISPOSITION OF SPECIMEN:  PATHOLOGY  COUNTS Correct:  YES    DICTATION #: The patient was taken to the operating room and prepped and draped in a normal sterile fashion. An in out catheter was used to drain the bladder.   A bivalve speculum was placed into the vagina and anterior lip of the cervix was grasped with a single-tooth tenaculum.  20 cc of 1% lidocaine was used for cervical block.  the cervix was then dilated with Kennon Rounds dilators up to 21. The hysteroscope was placed into the uterine cavity. The  entire uterus and both ostia were visualized.   Hyseroscope was then removed from the uterus. A sharp curettage was then done with a curette and endometrial curettings were obtained. The endometrial curettings were sent to pathology. Again the hysteroscope was placed into the uterine cavity. Both ostia were again visualized there were no polyps or submucosal fibroids or endometrial masses seen.  Thermachoice ablation of the uterus was done. The fundus was entirely ablated. A acorn was placed in the cervix and anchored to the tenaculum to manipulate the uterus.  agnostic Laparoscopy Procedure Note  Indications: The patient is a 28 y.o. female with retained IUD for six year After induction of general anesthesia, the patient was placed in modified dorsal lithotomy position where she was prepped, draped, and catheterized in the normal, sterile  fashion.  The cervix was visualized and an intrauterine manipulator was placed. A 1 cm umbilical incision was then performed. And carried down to the fascia.  Fascia was incised with a knife.  The peritoneum was entered with a hemostat. The fascial incision was was extended with mayo scissors.  A circumferential suture was placed around the fascia and anchored to the hasson once placed in the abdomen.  The abdomen was insufflated with CO2 gas.    The patient had normal appearing liver, uterus, tubes and ovaries and appendix.     Two 5 mm trocars were in the right and left lower quadrants of the abdomen under direct visualization.  The right ovary was known to be surgically absent. The left tube was c/w and interruption from her BTL  There was a large 12 cm ovarian cyst on the left ovary. Using the gyrus a linear incision was made on the ovary and two large cysts were dissected from the ovary using traction and counter traction.  Hair and sabaceous material was seen in the cysts s/w a dermoid.  Finally there was about a 5 cm simple cyst on the remaining ovary which was dissected and the cyst was removed.  Using the 77mm scope and endocatch bag the two dermoid cyst were removed.  One was removed intact and ruptured in the bag.  This was removed from the umbilical port.  The hasson was replaced in the abdomen.  The ovarian bed was made hemostatic with cautery and gelfoam placed in the ovarian cavity. Copious irrigation was done  of the pelvis.  Hemostasis was assured.    Both 5 mm trocars were removed under visualization.   Following the procedure the umbilical hasson was removed after intra-abdominal carbon dioxide was expressed. The incision was closed with subcutaneous and subcuticular sutures of 3-0 monocryl. The intrauterine manipulator was then removed.  Instrument, sponge, and needle counts were correct prior to abdominal closure and at the conclusion of the case.      The tenaculum and acorn  was removed  from the cervix and hemostasis was noted.   PLAN OF CARE: discharge to home  PATIENT DISPOSITION:  PACU - hemodynamically stable.

## 2013-10-11 NOTE — Anesthesia Preprocedure Evaluation (Signed)
Anesthesia Evaluation  Patient identified by MRN, date of birth, ID band Patient awake    Reviewed: Allergy & Precautions, H&P , Patient's Chart, lab work & pertinent test results, reviewed documented beta blocker date and time   History of Anesthesia Complications Negative for: history of anesthetic complications  Airway Mallampati: II TM Distance: >3 FB Neck ROM: full    Dental   Pulmonary  breath sounds clear to auscultation        Cardiovascular Exercise Tolerance: Good hypertension, Rhythm:regular Rate:Normal     Neuro/Psych  Headaches, PSYCHIATRIC DISORDERS  Neuromuscular disease    GI/Hepatic GERD-  Controlled,  Endo/Other    Renal/GU      Musculoskeletal   Abdominal   Peds  Hematology  (+) anemia ,   Anesthesia Other Findings LV dysfunction 12/2012, 06/2013 Hx: EF 45-50%, Resolved by Jan 2015 echo- EF 50-55%   Reproductive/Obstetrics                           Anesthesia Physical Anesthesia Plan  ASA: II  Anesthesia Plan: General ETT   Post-op Pain Management:    Induction:   Airway Management Planned:   Additional Equipment:   Intra-op Plan:   Post-operative Plan:   Informed Consent: I have reviewed the patients History and Physical, chart, labs and discussed the procedure including the risks, benefits and alternatives for the proposed anesthesia with the patient or authorized representative who has indicated his/her understanding and acceptance.   Dental Advisory Given  Plan Discussed with: CRNA and Surgeon  Anesthesia Plan Comments:         Anesthesia Quick Evaluation

## 2013-10-11 NOTE — Transfer of Care (Signed)
Immediate Anesthesia Transfer of Care Note  Patient: [name] [facility]  Procedure(s) Performed: Procedure(s): LAPAROSCOPY OPERATIVE WITH REMOVAL OF HYDROSALPINX  (N/A) DILATATION & CURETTAGE/HYSTEROSCOPY WITH THERMACHOICE ABLATION (N/A)  Patient Location: PACU  Anesthesia Type:General  Level of Consciousness: awake, alert  and oriented  Airway & Oxygen Therapy: Patient Spontanous Breathing and Patient connected to nasal cannula oxygen  Post-op Assessment: Report given to PACU RN and Post -op Vital signs reviewed and stable  Post vital signs: Reviewed and stable  Complications: No apparent anesthesia complications

## 2013-10-11 NOTE — Discharge Instructions (Signed)
Call [facility] @ [phone] if:  You have a temperature greater than or equal to 100.4 degrees Farenheit orally You have pain that is not made better by the pain medication given and taken as directed You have excessive bleeding or problems urinating  Take Colace (Docusate Sodium/Stool Softener) 100 mg 2-3 times daily while taking narcotic pain medicine to avoid constipation or until bowel movements are regular.  You may drive after 24 hours  You may shower tomorrow You may resume a regular diet Keep incisions clean and dry Do not lift over 15 pounds for 6 weeks Avoid anything in vagina  until after your post-operative visit  Keep post operative appointment with Dr. Charlesetta Garibaldi on Oct 24, 2013 at 4 p.m. Ovarian Cyst An ovarian cyst is a fluid-filled sac that forms on an ovary. The ovaries are small organs that produce eggs in women. Various types of cysts can form on the ovaries. Most are not cancerous. Many do not cause problems, and they often go away on their own. Some may cause symptoms and require treatment. Common types of ovarian cysts include:  Functional cysts These cysts may occur every month during the menstrual cycle. This is normal. The cysts usually go away with the next menstrual cycle if the woman does not get pregnant. Usually, there are no symptoms with a functional cyst.  Endometrioma cysts These cysts form from the tissue that lines the uterus. They are also called "chocolate cysts" because they become filled with blood that turns brown. This type of cyst can cause pain in the lower abdomen during intercourse and with your menstrual period.  Cystadenoma cysts This type develops from the cells on the outside of the ovary. These cysts can get very big and cause lower abdomen pain and pain with intercourse. This type of cyst can twist on itself, cut off its blood supply, and cause severe pain. It can also easily rupture and cause a lot of pain.  Dermoid cysts  This type of cyst is sometimes found in both ovaries. These cysts may contain different kinds of body tissue, such as skin, teeth, hair, or cartilage. They usually do not cause symptoms unless they get very big.  Theca lutein cysts These cysts occur when too much of a certain hormone (human chorionic gonadotropin) is produced and overstimulates the ovaries to produce an egg. This is most common after procedures used to assist with the conception of a baby (in vitro fertilization). CAUSES   Fertility drugs can cause a condition in which multiple large cysts are formed on the ovaries. This is called ovarian hyperstimulation syndrome.  A condition called polycystic ovary syndrome can cause hormonal imbalances that can lead to nonfunctional ovarian cysts. SIGNS AND SYMPTOMS  Many ovarian cysts do not cause symptoms. If symptoms are present, they may include:  Pelvic pain or pressure.  Pain in the lower abdomen.  Pain during sexual intercourse.  Increasing girth (swelling) of the abdomen.  Abnormal menstrual periods.  Increasing pain with menstrual periods.  Stopping having menstrual periods without being pregnant. DIAGNOSIS  These cysts are commonly found during a routine or annual pelvic exam. Tests may be ordered to find out more about the cyst. These tests may include:  Ultrasound.  X-ray of the pelvis.  CT scan.  MRI.  Blood tests. TREATMENT  Many ovarian cysts go away on their own without treatment. Your health care provider may want to check your cyst regularly for 2 3 months to see if it changes.  For women in menopause, it is particularly important to monitor a cyst closely because of the higher rate of ovarian cancer in menopausal women. When treatment is needed, it may include any of the following:  A procedure to drain the cyst (aspiration). This may be done using a long needle and ultrasound. It can also be done through a laparoscopic procedure. This involves using a  thin, lighted tube with a tiny camera on the end (laparoscope) inserted through a small incision.  Surgery to remove the whole cyst. This may be done using laparoscopic surgery or an open surgery involving a larger incision in the lower abdomen.  Hormone treatment or birth control pills. These methods are sometimes used to help dissolve a cyst. HOME CARE INSTRUCTIONS   Only take over-the-counter or prescription medicines as directed by your health care provider.  Follow up with your health care provider as directed.  Get regular pelvic exams and Pap tests. SEEK MEDICAL CARE IF:   Your periods are late, irregular, or painful, or they stop.  Your pelvic pain or abdominal pain does not go away.  Your abdomen becomes larger or swollen.  You have pressure on your bladder or trouble emptying your bladder completely.  You have pain during sexual intercourse.  You have feelings of fullness, pressure, or discomfort in your stomach.  You lose weight for no apparent reason.  You feel generally ill.  You become constipated.  You lose your appetite.  You develop acne.  You have an increase in body and facial hair.  You are gaining weight, without changing your exercise and eating habits.  You think you are pregnant. SEEK IMMEDIATE MEDICAL CARE IF:   You have increasing abdominal pain.  You feel sick to your stomach (nauseous), and you throw up (vomit).  You develop a fever that comes on suddenly.  You have abdominal pain during a bowel movement.  Your menstrual periods become heavier than usual. Document Released: 06/01/2005 Document Revised: 03/22/2013 Document Reviewed: 02/06/2013 [facility] Patient Information 2014 [place]. Ovarian Cystectomy, Care After Refer to this sheet in the next few weeks. These instructions provide you with information on caring for yourself after your procedure. Your health care provider may also give you more specific instructions. Your  treatment has been planned according to current medical practices, but problems sometimes occur. Call your health care provider if you have any problems or questions after your procedure.  WHAT TO EXPECT AFTER THE PROCEDURE After your procedure, it is typical to have the following:  Pain in your abdomen, especially at the incision sites. You will be given pain medicines to control the pain.  Tiredness. This is a normal part of the recovery process. Your energy level will return to normal over the next several weeks.  Constipation. HOME CARE INSTRUCTIONS  Only take over-the-counter or prescription medicines as directed by your health care provider. Avoid taking aspirin because it can cause bleeding.   Follow your health care provider's instructions for when to resume your regular diet, exercise, and activities.   Take rest breaks during the day as needed.  Do not douche or have sexual intercourse until you have permission from your health care provider.   Remove or change any bandages (dressings) as directed by your health care provider.   Do not drive until your health care provider approves.   Take showers instead of baths until your health care provider tells you otherwise.   If you become constipated, you may:   Use a  mild laxative if your health care provider approves.  Add more fruit and bran to your diet.  Drink more fluids.  Take your temperature twice a day and record it.   Do not drink alcohol while taking pain medicine.   Try to have someone home with you for the first 1 2 weeks to help with your household activities.   Follow up with your health care provider as directed. SEEK MEDICAL CARE IF:  You have a fever.   You feel sick to your stomach (nauseous) and throw up (vomit).   You have redness, swelling, or leakage of fluid at the incision site.   You have pain when you urinate or have blood in your urine.   You have a rash on your body.    You have pain or redness where the IV tube was inserted.   You have pain that is not relieved with medicine.  SEEK IMMEDIATE MEDICAL CARE IF:  You have chest pain or shortness of breath.   You feel dizzy or lightheaded.   You have increasing abdominal pain that is not relieved with medicines.   You have pain, swelling, or redness in your leg.   You see a yellowish white fluid (pus) coming from the incision.   Your incision is opening (edges not staying together).  Document Released: 03/22/2013 Document Reviewed: 01/11/2013 [facility] Patient Information 2014 [place].  Post Anesthesia Home Care Instructions  Activity: Get plenty of rest for the remainder of the day. A responsible adult should stay with you for 24 hours following the procedure.  For the next 24 hours, DO NOT: -Drive a car -Paediatric nurse -Drink alcoholic beverages -Take any medication unless instructed by your physician -Make any legal decisions or sign important papers.  Meals: Start with liquid foods such as gelatin or soup. Progress to regular foods as tolerated. Avoid greasy, spicy, heavy foods. If nausea and/or vomiting occur, drink only clear liquids until the nausea and/or vomiting subsides. Call your physician if vomiting continues.  Special Instructions/Symptoms: Your throat may feel dry or sore from the anesthesia or the breathing tube placed in your throat during surgery. If this causes discomfort, gargle with warm salt water. The discomfort should disappear within 24 hours.

## 2013-10-11 NOTE — H&P (View-Only) (Signed)
[name] is a 28 y.o. female P 5-0-1-5 S/P  tubal sterilization,  presents for removal of a hydrosalpinx and endometrial ablation because of menorrhagia for the past year.   Though periods are monthly and only last for 3 days, the patient has to change double protection 10 times a day and gets up several times at nigh to change to prevent soiling her linen. This flow is accompanied by a throbbing lower back pain that radiates to her thighs and is rated at a  7/10 on a 10 point pain scale.  Pain is only responsive to Tylenol #3 as it will decrease pain to 2/10.  She denies any dyspareunia, intermenstrual bleeding or changes in bowel/bladder function (states she's always constipated). In March 2015 the patient had a normal CBC, TSH and CMET.   Her endometrial biopsy showed benign secretory endometrium with no hyperplasia, atypia or malignancy.  An ultrasound at that same time showed a uterus-6.3 x 5.8 x 4.8 cm, a surgically absent right ovary with an adnexal tubular structure ? hydrosalpinx; left ovary-3.30  x 2.18 x 1.45 cm; uterine length from fundus to external os-9.3 cm and cavity width-3.6 cm and length-5.4 cm.  After consideration of both medical and surgical management options for her symptoms,  the patient has decided to proceed with an endometrial biopsy to manage her menorrhagia and removal of hydrosalpinx.   Past Medical History  OB History: G 6;  P 5-0-1-5;  SVB  GYN History: LMP: 4/10/2015Contr acepton: Tubal Sterilization;  Denies history of STDs or abnormal PAP smear;  Last PAP smear: 2013  Medical History: Anxiety/Panic Attacks, PTSD, Depression, Migraine,  Pregnancy Induced Hypertension,  Right Carpal Tunnel Syndrome, GERD, Anemia, Renal Stones,   Surgical History:  2014  Tubal Sterilization;   Right Salpingo-oophorectomy (Dermoid Cyst);   2009  D & C;    2005  Right Breast Biopsy-benign Denies problems with anesthesia or history of blood transfusions  Family History: Hypertension,   HIV, CHF, Alcoholism, Anemia, Asthma, Depression and Diabetes Mellitus  Social History:  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 Labetalol Other (See Comments)    Alopecia   . Lisinopril Cough    cough    [id number] to occasional shortness of breath  and palpitations with panic attacks and constipation,  but  denies headache, vision changes, nasal congestion, dysphagia, tinnitus, dizziness, hoarseness, cough,  chest pain,  nausea, vomiting, diarrhea, urinary frequency, urgency  dysuria, hematuria, vaginitis symptoms, swelling of joints,easy bruising,  myalgias, arthralgias, skin rashes, unexplained weight loss and except as is mentioned in the history of present illness, patient's review of systems is otherwise negative.  Physical Exam  Bp: 132/80  Weight: 139 lbs.  Height: 5\' 5"    BMI: 23.1  Neck: supple without masses or thyromegaly Lungs: clear to auscultation Heart: regular rate and rhythm Abdomen: soft, non-tender and no organomegaly Pelvic:EGBUS- wnl; vagina-normal rugae; uterus-normal size, cervix without lesions or motion tenderness; adnexae-mild left tenderness but no r masses Extremities:  no clubbing, cyanosis or edema   Assesment:  Menorrhagia            Dysmenorrhea            Hydrosalpinx    Disposition:  A discussion was held with patient regarding the indication for her procedure(s) along with the risks, which include but are  not limited to: reaction to anesthesia, damage to adjacent organs, infection and excessive bleeding. the patient verbalized understanding of these risks and has consented to proceed with Laparoscopic Removal of Hydrosalpinx,  Possible Exploratory Laparotomy, Hysteroscopy, Dilatation, Curettage and Thermachoice Endometrial Ablation.at [place] on October 11, 2013   CSN# [id number]    Andriea Hasegawa J. Florene Glen, PA-C  for Dr. Franklyn Lor. Dillard

## 2013-10-11 NOTE — Anesthesia Postprocedure Evaluation (Signed)
Anesthesia Post Note  Patient: [name]  Procedure(s) Performed: Procedure(s) (LRB): LAPAROSCOPY OPERATIVE WITH REMOVAL OF HYDROSALPINX  (N/A) DILATATION & CURETTAGE/HYSTEROSCOPY WITH THERMACHOICE ABLATION (N/A)  Anesthesia type: General  Patient location: PACU  Post pain: Pain level controlled  Post assessment: Post-op Vital signs reviewed  Last Vitals:  Filed Vitals:   10/11/13 1400  BP: 150/95  Pulse: 65  Temp:   Resp: 15    Post vital signs: Reviewed  Level of consciousness: sedated  Complications: No apparent anesthesia complications

## 2013-10-11 NOTE — Anesthesia Procedure Notes (Signed)
Procedure Name: Intubation Date/Time: 10/11/2013 9:56 AM Performed by: Jonna Munro Pre-anesthesia Checklist: Suction available, Emergency Drugs available, Timeout performed, Patient identified and Patient being monitored Patient Re-evaluated:Patient Re-evaluated prior to inductionOxygen Delivery Method: Circle system utilized Preoxygenation: Pre-oxygenation with 100% oxygen Intubation Type: IV induction Ventilation: Mask ventilation without difficulty Laryngoscope Size: Mac and 3 Grade View: Grade I Tube type: Oral Tube size: 7.0 mm Airway Equipment and Method: Stylet Secured at: 21 cm Tube secured with: Tape Dental Injury: Teeth and Oropharynx as per pre-operative assessment

## 2013-10-12 ENCOUNTER — Encounter (HOSPITAL_COMMUNITY): Payer: Self-pay | Admitting: Obstetrics and Gynecology

## 2013-10-12 MED FILL — Heparin Sodium (Porcine) Inj 5000 Unit/ML: INTRAMUSCULAR | Qty: 1 | Status: AC

## 2013-10-18 ENCOUNTER — Encounter (HOSPITAL_COMMUNITY): Payer: Self-pay | Admitting: General Practice

## 2013-10-18 ENCOUNTER — Inpatient Hospital Stay (HOSPITAL_COMMUNITY): Payer: BC Managed Care – PPO

## 2013-10-18 ENCOUNTER — Observation Stay (HOSPITAL_COMMUNITY)
Admission: AD | Admit: 2013-10-18 | Discharge: 2013-10-19 | Disposition: A | Discharge status: Home / Self Care | Payer: BC Managed Care – PPO | Source: Ambulatory Visit | Attending: Obstetrics and Gynecology | Admitting: Obstetrics and Gynecology

## 2013-10-18 DIAGNOSIS — R509 Fever, unspecified: Principal | ICD-10-CM | POA: Insufficient documentation

## 2013-10-18 DIAGNOSIS — R52 Pain, unspecified: Secondary | ICD-10-CM | POA: Insufficient documentation

## 2013-10-18 DIAGNOSIS — I10 Essential (primary) hypertension: Secondary | ICD-10-CM | POA: Insufficient documentation

## 2013-10-18 DIAGNOSIS — K219 Gastro-esophageal reflux disease without esophagitis: Secondary | ICD-10-CM | POA: Insufficient documentation

## 2013-10-18 LAB — URINALYSIS, ROUTINE W REFLEX MICROSCOPIC
Bilirubin Urine: NEGATIVE
GLUCOSE, UA: NEGATIVE mg/dL
HGB URINE DIPSTICK: NEGATIVE
Ketones, ur: 15 mg/dL — AB
LEUKOCYTES UA: NEGATIVE
Nitrite: NEGATIVE
PROTEIN: NEGATIVE mg/dL
SPECIFIC GRAVITY, URINE: 1.02 (ref 1.005–1.030)
Urobilinogen, UA: 0.2 mg/dL (ref 0.0–1.0)
pH: 7.5 (ref 5.0–8.0)

## 2013-10-18 LAB — COMPREHENSIVE METABOLIC PANEL
ALT: 6 U/L (ref 0–35)
AST: 14 U/L (ref 0–37)
Albumin: 4.3 g/dL (ref 3.5–5.2)
Alkaline Phosphatase: 53 U/L (ref 39–117)
BILIRUBIN TOTAL: 0.4 mg/dL (ref 0.3–1.2)
BUN: 15 mg/dL (ref 6–23)
CHLORIDE: 100 meq/L (ref 96–112)
CO2: 23 mEq/L (ref 19–32)
Calcium: 9.9 mg/dL (ref 8.4–10.5)
Creatinine, Ser: 0.86 mg/dL (ref 0.50–1.10)
GFR calc non Af Amer: >90 mL/min (ref >90)
Glucose, Bld: 91 mg/dL (ref 70–99)
Potassium: 3.9 mEq/L (ref 3.7–5.3)
SODIUM: 138 meq/L (ref 137–147)
Total Protein: 7 g/dL (ref 6.0–8.3)

## 2013-10-18 LAB — CBC WITH DIFFERENTIAL/PLATELET
BASOS ABS: 0.1 10*3/uL (ref 0.0–0.1)
Basophils Relative: 1 % (ref 0–1)
Eosinophils Absolute: 0.1 10*3/uL (ref 0.0–0.7)
Eosinophils Relative: 1 % (ref 0–5)
HCT: 36 % (ref 36.0–46.0)
Hemoglobin: 11.8 g/dL — ABNORMAL LOW (ref 12.0–15.0)
LYMPHS PCT: 8 % — AB (ref 12–46)
Lymphs Abs: 1 10*3/uL (ref 0.7–4.0)
MCH: 25.3 pg — ABNORMAL LOW (ref 26.0–34.0)
MCHC: 32.8 g/dL (ref 30.0–36.0)
MCV: 77.3 fL — AB (ref 78.0–100.0)
MONO ABS: 0.6 10*3/uL (ref 0.1–1.0)
Monocytes Relative: 5 % (ref 3–12)
NEUTROS ABS: 11 10*3/uL — AB (ref 1.7–7.7)
Neutrophils Relative %: 87 % — ABNORMAL HIGH (ref 43–77)
PLATELETS: 201 10*3/uL (ref 150–400)
RBC: 4.66 MIL/uL (ref 3.87–5.11)
RDW: 13.5 % (ref 11.5–15.5)
WBC: 12.7 10*3/uL — AB (ref 4.0–10.5)

## 2013-10-18 MED ORDER — ONDANSETRON HCL 4 MG PO TABS: 4.0000 mg | ORAL_TABLET | Freq: Four times a day (QID) | ORAL | Status: DC | PRN

## 2013-10-18 MED ORDER — AZITHROMYCIN 250 MG PO TABS
250.0000 mg | ORAL_TABLET | Freq: Every day | ORAL | Status: DC
  Administered 2013-10-19: 250 mg via ORAL
  Filled 2013-10-18 (×2): qty 1

## 2013-10-18 MED ORDER — BISACODYL 10 MG RE SUPP
10.0000 mg | Freq: Every day | RECTAL | Status: DC | PRN
  Filled 2013-10-18 (×2): qty 1

## 2013-10-18 MED ORDER — IBUPROFEN 600 MG PO TABS
600.0000 mg | ORAL_TABLET | Freq: Four times a day (QID) | ORAL | Status: DC
  Administered 2013-10-18 – 2013-10-19 (×3): 600 mg via ORAL
  Filled 2013-10-18 (×3): qty 1

## 2013-10-18 MED ORDER — ACETAMINOPHEN 325 MG PO TABS
650.0000 mg | ORAL_TABLET | Freq: Four times a day (QID) | ORAL | Status: DC | PRN
  Administered 2013-10-18: 650 mg via ORAL
  Filled 2013-10-18: qty 2

## 2013-10-18 MED ORDER — ONDANSETRON HCL 4 MG/2ML IJ SOLN: 4.0000 mg | Freq: Four times a day (QID) | INTRAMUSCULAR | Status: DC | PRN

## 2013-10-18 MED ORDER — AZITHROMYCIN 500 MG PO TABS
500.0000 mg | ORAL_TABLET | Freq: Every day | ORAL | Status: AC
  Administered 2013-10-18: 500 mg via ORAL
  Filled 2013-10-18: qty 1

## 2013-10-18 MED ORDER — LACTATED RINGERS IV SOLN
INTRAVENOUS | Status: DC
  Administered 2013-10-19 (×2): via INTRAVENOUS

## 2013-10-18 MED ORDER — ONDANSETRON HCL 4 MG/2ML IJ SOLN
4.0000 mg | Freq: Four times a day (QID) | INTRAMUSCULAR | Status: DC | PRN
  Administered 2013-10-18: 4 mg via INTRAVENOUS
  Filled 2013-10-18: qty 2

## 2013-10-18 MED ORDER — DOCUSATE SODIUM 100 MG PO CAPS
100.0000 mg | ORAL_CAPSULE | Freq: Two times a day (BID) | ORAL | Status: DC
  Administered 2013-10-18: 100 mg via ORAL
  Filled 2013-10-18 (×4): qty 1

## 2013-10-18 MED ORDER — LACTATED RINGERS IV BOLUS (SEPSIS)
500.0000 mL | Freq: Once | INTRAVENOUS | Status: AC
  Administered 2013-10-18: 1000 mL via INTRAVENOUS

## 2013-10-18 NOTE — H&P (Signed)
History/Physical:  28 yo [id number] s/p D&C, hysteroscopy, endometrial ablation, and L/S left ovarian cystectomy by Dr. Charlesetta Garibaldi on 4/29 presented today with report of fever to 102.4 today, sore throat and ears, and generalized body aches/pains.  Felt nauseated and faint while getting a mani/pedi today, but did not progress beyond just feeling that way.  Also reports feeling heaviness in her chest, but denies cough, nasal congestion, dysuria, viral exposures, vaginal d/c.  Hx of chest pain, LV dysfunction in past, and chronic hypertension, but had normal cardiac w/u 06/28/13.  She is followed by Dr. Gwenlyn Found at [facility].  Hx of pp chest pain and hypertension, with patient on Losartan for BP control.  Patient Active Problem List   Diagnosis Date Noted  . Hypertension 11/13/2011    Priority: High  . Anxiety 06/28/2013  . Hypokalemia 04/28/2013  . Chest pain at rest 01/06/2013  . LV dysfunction, by Echo EF 45-50%- resolved by Echo Jan 2015 01/06/2013  . Tachycardia 01/06/2013  . Musculoskeletal leg pain 03/31/2012  . Herpes   . H/O varicella   . Pelvic pain   . Panic attack   . Right ovarian cyst   . Anemia   . GERD   . H/O chest pain 06/17/2011  . Diastasis recti 12/12/2008  . Abnormal Pap smear 08/14/2003    No chief complaint on file.  HPI:  See above  OB History   Grav Para Term Preterm Abortions TAB SAB Ect Mult Living   6 5 5  1  1   5       Past Medical History  Diagnosis Date  . Hypertension   . Low BMI   . Postpartum hypertension   . Spotting in first trimester     With SAB  . Herpes     cold sores  . GBS carrier     First pregnancy  . H/O varicella   . Abnormal Pap smear 08/2003    colpo  . Pelvic pain     With pregnancy  . Diastasis recti 12/12/08  . Stress     work and at home - no meds  . H/O chest pain 06/17/11, 06/2013    Resolved 06/2013  . Right ovarian cyst 2003    RT OOPHRECTOMY  . Preeclampsia 2010,2011    POST PARTUM;WAS PUT ON BP MEDS  .  Preterm contractions 2007    GIVEN BETAMETHASONE AND PROCARDIA TO STOP CTXS  . Headache(784.0)     migraines;D/T BP  . Carpal tunnel syndrome of right wrist   . Palpitations     Hx only - no problems  . LV dysfunction 12/2012, 06/2013    Hx: EF 45-50%, Resolved by Jan 2015 echo- EF 50-55%  . HTN (hypertension)   . SVD (spontaneous vaginal delivery)     x 5  . Panic attack     HAS BEEN RX'D XANAX  . History of kidney stones     passed stone - no surgery required  . GERD     zantac  . Anemia     History -FeSO4 SUPP IN PAST    Past Surgical History  Procedure Laterality Date  . Ovary removed      right - laparotomy  . Dilation and curettage of uterus  2009  . Breast cyst removal  2005    right  . Tubal ligation  07/03/2012    Procedure: POST PARTUM TUBAL LIGATION;  Surgeon: Delice Lesch, MD;  Location: [place] [facility];  Service: Gynecology;  Laterality: Bilateral;  Bilateral post partum tubal ligation  . Wisdom tooth extraction    . Laparoscopy N/A 10/11/2013    Procedure: LAPAROSCOPY OPERATIVE WITH REMOVAL OF HYDROSALPINX ;  Surgeon: Betsy Coder, MD;  Location: [place] [facility];  Service: Gynecology;  Laterality: N/A;  . Dilitation & currettage/hystroscopy with thermachoice ablation N/A 10/11/2013    Procedure: DILATATION & CURETTAGE/HYSTEROSCOPY WITH THERMACHOICE ABLATION;  Surgeon: Betsy Coder, MD;  Location: [place] [facility];  Service: Gynecology;  Laterality: N/A;    Family History  Problem Relation Age of Onset  . Heart disease Mother     CHF  . Hypertension Mother   . Asthma Mother     CHILDHOOD  . Diabetes Mother   . Kidney disease Mother     dialysis  . Depression Mother   . Alcohol abuse Mother   . Drug abuse Mother   . Heart failure Mother   . Hypertension Father   . Alcohol abuse Father   . Drug abuse Father   . Asthma Sister   . COPD Sister     chronic bronchitis  . Depression Sister     ATTEMPTED SUICIDE  . Arrhythmia Sister     needs ablation   . Hypertension  Maternal Grandmother   . Diabetes Maternal Grandmother     History  Substance Use Topics  . Smoking status: Never Smoker   . Smokeless tobacco: Never Used  . Alcohol Use: No    Allergies:  Allergies  Allergen Reactions  . Labetalol Other (See Comments)    Alopecia   . Lisinopril Cough    cough    Prescriptions prior to admission  Medication Sig Dispense Refill  . ibuprofen (ADVIL,MOTRIN) 600 MG tablet 1 PO  PC Q 6 HOURS X 3 DAYS THEN PRN-PAIN  30 tablet  1  . losartan (COZAAR) 50 MG tablet Take 1 tablet (50 mg total) by mouth daily.  30 tablet  6  . Multiple Vitamins-Minerals (MULTIVITAMIN & MINERAL PO) Take 1 tablet by mouth daily.      . ondansetron (ZOFRAN) 8 MG tablet Take 8 mg by mouth every 8 (eight) hours as needed for nausea or vomiting.      Marland Kitchen oxyCODONE-acetaminophen (ROXICET) 5-325 MG per tablet Take 1 tablet by mouth every 4 (four) hours as needed for severe pain.  30 tablet  0  . ranitidine (ZANTAC) 150 MG tablet Take 150 mg by mouth daily as needed for heartburn.        ROS:  Fever, chills, chest pain, throat pain, body aches, anxiety Physical Exam   Blood pressure 152/87, pulse 110, temperature 102.8 F (39.3 C), temperature source Oral, resp. rate 22, height 5\' 4"  (1.626 m), weight 132 lb (59.875 kg), last menstrual period 09/19/2013, SpO2 100.00%, not currently breastfeeding.  Physical Exam Presenting with chills Throat red. Ears--right ear canal red and inflamed, left less so Nasal turbinates slightly red, boggy. Chest clear Heart RRR without murmur, rate 90-110 Abd--generalized soreness, no rebound or guarding.  L/S incisions CDI. Uterus NT. Back--overall soreness bilaterally, no clear CVAT. Ext negative Homan's, no edema.  Small bruised area on lateral area of right calf, feels like small hematoma under the skin.   ED Course  Fever, ? Viral sx 7 day s/p D&C/ablation/hysteroscopy/L/S for left ovarian cystectomy Anxiety  Plan: Consulted with  Dr. Cletis Media CBC, diff, CMP UA, urine culture Chest Xray IV hydration Zofran Acetaminophen for fever   Donnel Saxon CNM, MSN 10/18/2013 7:09 PM  Addendum: Patient still very anxious regarding status. Reviewed negative CXR, normal urine, slight elevation of WBC count, otherwise normal labs.  Filed Vitals:   10/18/13 1801 10/18/13 1804 10/18/13 1954  BP:  152/87   Pulse:  110   Temp:  102.8 F (39.3 C) 103 F (39.4 C)  TempSrc:  Oral Oral  Resp:  22   Height:  5\' 4"  (1.626 m)   Weight:  132 lb (59.875 kg)   SpO2: 100% 100%    Received Acetaminophen 650 mg at 7p--temp 103 at 7:74p Received Zofran 4 mg IV at 7:01p  CXR--WNL.  Will admit for observation to 3rd floor. Will alternate Tylenol and Motrin for anti-pyretic effects for 24 hours. Flu PCR pending Continue IV hydration. Zpak for possible URI. Consulted with Dr. Marinda Elk do blood cultures if temp spikes again to > 102. Arville Go, CNM, will follow patient during night.  Donnel Saxon, CNM 10/18/13 9p

## 2013-10-18 NOTE — MAU Note (Signed)
Surgery last Wed- 04/29, endometrial ablation, rt tube and cysts off rt ovary.  Hit stomach last night- took some medication.  Felt bad when got up, throat hurt, ears. Almost passed out.  Body hurts all over. No cough

## 2013-10-18 NOTE — MAU Provider Note (Signed)
History   28 yo [id number] s/p D&C, hysteroscopy, endometrial ablation, and L/S left ovarian cystectomy by Dr. Charlesetta Garibaldi on 4/29 presented today with report of fever to 102.4 today, sore throat and ears, and generalized body aches/pains.  Felt nauseated and faint while getting a mani/pedi today, but did not progress beyond just feeling that way.  Also reports feeling heaviness in her chest, but denies cough, nasal congestion, dysuria, viral exposures, vaginal d/c.  Hx of chest pain, LV dysfunction in past, and chronic hypertension, but had normal cardiac w/u 06/28/13.  She is followed by Dr. Gwenlyn Found at [facility].  Hx of pp chest pain and hypertension, with patient on Losartan for BP control.  Patient Active Problem List   Diagnosis Date Noted  . Hypertension 11/13/2011    Priority: High  . Anxiety 06/28/2013  . Hypokalemia 04/28/2013  . Chest pain at rest 01/06/2013  . LV dysfunction, by Echo EF 45-50%- resolved by Echo Jan 2015 01/06/2013  . Tachycardia 01/06/2013  . Musculoskeletal leg pain 03/31/2012  . Herpes   . H/O varicella   . Pelvic pain   . Panic attack   . Right ovarian cyst   . Anemia   . GERD   . H/O chest pain 06/17/2011  . Diastasis recti 12/12/2008  . Abnormal Pap smear 08/14/2003    No chief complaint on file.  HPI:  See above  OB History   Grav Para Term Preterm Abortions TAB SAB Ect Mult Living   6 5 5  1  1   5       Past Medical History  Diagnosis Date  . Hypertension   . Low BMI   . Postpartum hypertension   . Spotting in first trimester     With SAB  . Herpes     cold sores  . GBS carrier     First pregnancy  . H/O varicella   . Abnormal Pap smear 08/2003    colpo  . Pelvic pain     With pregnancy  . Diastasis recti 12/12/08  . Stress     work and at home - no meds  . H/O chest pain 06/17/11, 06/2013    Resolved 06/2013  . Right ovarian cyst 2003    RT OOPHRECTOMY  . Preeclampsia 2010,2011    POST PARTUM;WAS PUT ON BP MEDS  . Preterm  contractions 2007    GIVEN BETAMETHASONE AND PROCARDIA TO STOP CTXS  . Headache(784.0)     migraines;D/T BP  . Carpal tunnel syndrome of right wrist   . Palpitations     Hx only - no problems  . LV dysfunction 12/2012, 06/2013    Hx: EF 45-50%, Resolved by Jan 2015 echo- EF 50-55%  . HTN (hypertension)   . SVD (spontaneous vaginal delivery)     x 5  . Panic attack     HAS BEEN RX'D XANAX  . History of kidney stones     passed stone - no surgery required  . GERD     zantac  . Anemia     History -FeSO4 SUPP IN PAST    Past Surgical History  Procedure Laterality Date  . Ovary removed      right - laparotomy  . Dilation and curettage of uterus  2009  . Breast cyst removal  2005    right  . Tubal ligation  07/03/2012    Procedure: POST PARTUM TUBAL LIGATION;  Surgeon: Delice Lesch, MD;  Location: [place] [facility];  Service: Gynecology;  Laterality: Bilateral;  Bilateral post partum tubal ligation  . Wisdom tooth extraction    . Laparoscopy N/A 10/11/2013    Procedure: LAPAROSCOPY OPERATIVE WITH REMOVAL OF HYDROSALPINX ;  Surgeon: Betsy Coder, MD;  Location: [place] [facility];  Service: Gynecology;  Laterality: N/A;  . Dilitation & currettage/hystroscopy with thermachoice ablation N/A 10/11/2013    Procedure: DILATATION & CURETTAGE/HYSTEROSCOPY WITH THERMACHOICE ABLATION;  Surgeon: Betsy Coder, MD;  Location: [place] [facility];  Service: Gynecology;  Laterality: N/A;    Family History  Problem Relation Age of Onset  . Heart disease Mother     CHF  . Hypertension Mother   . Asthma Mother     CHILDHOOD  . Diabetes Mother   . Kidney disease Mother     dialysis  . Depression Mother   . Alcohol abuse Mother   . Drug abuse Mother   . Heart failure Mother   . Hypertension Father   . Alcohol abuse Father   . Drug abuse Father   . Asthma Sister   . COPD Sister     chronic bronchitis  . Depression Sister     ATTEMPTED SUICIDE  . Arrhythmia Sister     needs ablation   . Hypertension Maternal  Grandmother   . Diabetes Maternal Grandmother     History  Substance Use Topics  . Smoking status: Never Smoker   . Smokeless tobacco: Never Used  . Alcohol Use: No    Allergies:  Allergies  Allergen Reactions  . Labetalol Other (See Comments)    Alopecia   . Lisinopril Cough    cough    Prescriptions prior to admission  Medication Sig Dispense Refill  . ibuprofen (ADVIL,MOTRIN) 600 MG tablet 1 PO  PC Q 6 HOURS X 3 DAYS THEN PRN-PAIN  30 tablet  1  . losartan (COZAAR) 50 MG tablet Take 1 tablet (50 mg total) by mouth daily.  30 tablet  6  . Multiple Vitamins-Minerals (MULTIVITAMIN & MINERAL PO) Take 1 tablet by mouth daily.      . ondansetron (ZOFRAN) 8 MG tablet Take 8 mg by mouth every 8 (eight) hours as needed for nausea or vomiting.      Marland Kitchen oxyCODONE-acetaminophen (ROXICET) 5-325 MG per tablet Take 1 tablet by mouth every 4 (four) hours as needed for severe pain.  30 tablet  0  . ranitidine (ZANTAC) 150 MG tablet Take 150 mg by mouth daily as needed for heartburn.        ROS:  Fever, chills, chest pain, throat pain, body aches, anxiety Physical Exam   Blood pressure 152/87, pulse 110, temperature 102.8 F (39.3 C), temperature source Oral, resp. rate 22, height 5\' 4"  (1.626 m), weight 132 lb (59.875 kg), last menstrual period 09/19/2013, SpO2 100.00%, not currently breastfeeding.  Physical Exam Presenting with chills. Chest clear Heart RRR without murmur, rate 90-110 Abd--generalized soreness, no rebound or guarding.  L/S incisions CDI. Uterus NT. Back--overall soreness bilaterally, no clear CVAT. Ext negative Homan's, no edema.  Small bruised area on lateral area of right calf, feels like small hematoma under the skin.   ED Course  Fever, ? Viral sx 7 day s/p D&C/ablation/hysteroscopy/L/S for left ovarian cystectomy  Plan: Consulted with Dr. Cletis Media CBC, diff, CMP UA, urine culture Chest Xray IV hydration Zofran Acetaminophen for fever   Donnel Saxon  CNM, MSN 10/18/2013 7:09 PM

## 2013-10-19 DIAGNOSIS — R509 Fever, unspecified: Secondary | ICD-10-CM | POA: Diagnosis present

## 2013-10-19 LAB — CBC WITH DIFFERENTIAL/PLATELET
BASOS ABS: 0.1 10*3/uL (ref 0.0–0.1)
BASOS PCT: 1 % (ref 0–1)
EOS ABS: 0.1 10*3/uL (ref 0.0–0.7)
EOS PCT: 1 % (ref 0–5)
HCT: 32.4 % — ABNORMAL LOW (ref 36.0–46.0)
Hemoglobin: 10.7 g/dL — ABNORMAL LOW (ref 12.0–15.0)
Lymphocytes Relative: 10 % — ABNORMAL LOW (ref 12–46)
Lymphs Abs: 1.1 10*3/uL (ref 0.7–4.0)
MCH: 25.7 pg — AB (ref 26.0–34.0)
MCHC: 33 g/dL (ref 30.0–36.0)
MCV: 77.7 fL — AB (ref 78.0–100.0)
MONO ABS: 1 10*3/uL (ref 0.1–1.0)
Monocytes Relative: 8 % (ref 3–12)
Neutro Abs: 9.4 10*3/uL — ABNORMAL HIGH (ref 1.7–7.7)
Neutrophils Relative %: 80 % — ABNORMAL HIGH (ref 43–77)
Platelets: 170 10*3/uL (ref 150–400)
RBC: 4.17 MIL/uL (ref 3.87–5.11)
RDW: 13.6 % (ref 11.5–15.5)
WBC: 11.6 10*3/uL — ABNORMAL HIGH (ref 4.0–10.5)

## 2013-10-19 LAB — INFLUENZA PANEL BY PCR (TYPE A & B)
H1N1 flu by pcr: NOT DETECTED
Influenza A By PCR: NEGATIVE
Influenza B By PCR: NEGATIVE

## 2013-10-19 MED ORDER — ONDANSETRON HCL 4 MG PO TABS: 4.0000 mg | ORAL_TABLET | Freq: Four times a day (QID) | ORAL | Status: DC | PRN

## 2013-10-19 MED ORDER — IBUPROFEN 600 MG PO TABS
ORAL_TABLET | ORAL | Status: DC
Start: 2013-10-19 — End: 2014-04-02

## 2013-10-19 MED ORDER — MENTHOL 3 MG MT LOZG: 1.0000 | LOZENGE | OROMUCOSAL | Status: DC | PRN

## 2013-10-19 MED ORDER — AZITHROMYCIN 250 MG PO TABS: ORAL_TABLET | ORAL | Status: DC

## 2013-10-19 MED ORDER — OXYCODONE-ACETAMINOPHEN 5-325 MG PO TABS: 1.0000 | ORAL_TABLET | ORAL | Status: DC | PRN

## 2013-10-19 NOTE — Progress Notes (Addendum)
[name] is a39 y.o.  [id number]  Hospital Day #2  Subjective: Patient is feeling much better than yesterday with no more "total body" aches, only headache and sore throat. Patient has had a bowel movement, is voiding without difficulty and tolerating p.o.  Objective: Vital signs in last 24 hours: Temp:  [98.4 F (36.9 C)-103 F (39.4 C)] 99.3 F (37.4 C) (05/07 0630) Pulse Rate:  [78-110] 83 (05/07 0630) Resp:  [18-22] 18 (05/07 0630) BP: (126-152)/(85-87) 134/86 mmHg (05/07 0630) SpO2:  [100 %] 100 % (05/07 0630) Weight:  [132 lb (59.875 kg)] 132 lb (59.875 kg) (05/06 1804)  Intake/Output from previous day: 05/06 0701 - 05/07 0700 In: 1471 [P.O.:340; I.V.:1131] Out: 401 [Urine:400] Intake/Output this shift:    Recent Labs Lab 10/18/13 1848 10/19/13 0505  WBC 12.7* 11.6*  HGB 11.8* 10.7*  HCT 36.0 32.4*  PLT 201 170     Recent Labs Lab 10/18/13 1848  NA 138  K 3.9  CL 100  CO2 23  BUN 15  CREATININE 0.86  CALCIUM 9.9  PROT 7.0  BILITOT 0.4  ALKPHOS 53  ALT 6  AST 14  GLUCOSE 91    EXAM: General: alert, cooperative and no distress Resp: clear to auscultation bilaterally Cardio: regular rate and rhythm, S1, S2 normal, no murmur, click, rub or gallop GI: Active bowel sounds and non-tender. Extremities: SCD hose in place with no calf tenderness.   Assessment:  Fever of Unknow Origin-Resolved  Plan: Throat Lozenges Probable discharge today  LOS: 1 day    Earnstine Regal, PA-C 10/19/2013 7:19 AM  Seen and agreed Feeling much better with minimal myalgia and better appetite Has remained afebrile since admission: likely viral D/C home Follow-up [id number] with dr Charlesetta Garibaldi for post-op visit

## 2013-10-19 NOTE — Discharge Summary (Signed)
  Physician Discharge Summary  Patient ID: [name] MRN: [id number] DOB/AGE: 1985/12/26 28 y.o.  Admit date: 10/18/2013 Discharge date: 10/19/2013  Admission Diagnoses: PO 102.4 FEVER  Discharge Diagnoses: PO 102.4 FEVER        Principal Problem:   Fever of unknown origin Active Problems:   Fever   Discharged Condition: good  Hospital Course: IV hydration and hyperthermia support  Treatments: IV hydration and antibiotics: azithromycin  Disposition: 01-Home or Self Care     Medication List    STOP taking these medications       MULTIVITAMIN & MINERAL PO      TAKE these medications       azithromycin 250 MG tablet  Commonly known as:  ZITHROMAX  1  po  daily     ibuprofen 600 MG tablet  Commonly known as:  ADVIL,MOTRIN  1 PO  PC Q 6 HOURS X 3 DAYS THEN PRN-PAIN     losartan 50 MG tablet  Commonly known as:  COZAAR  Take 1 tablet (50 mg total) by mouth daily.     ondansetron 8 MG tablet  Commonly known as:  ZOFRAN  Take 8 mg by mouth every 8 (eight) hours as needed for nausea or vomiting.     ondansetron 4 MG tablet  Commonly known as:  ZOFRAN  Take 1 tablet (4 mg total) by mouth every 6 (six) hours as needed for nausea.     oxyCODONE-acetaminophen 5-325 MG per tablet  Commonly known as:  ROXICET  Take 1 tablet by mouth every 4 (four) hours as needed for severe pain.     ranitidine 150 MG tablet  Commonly known as:  ZANTAC  Take 150 mg by mouth daily as needed for heartburn.         Signed: Alwyn Pea, MD 10/19/2013, 2:35 PM

## 2013-10-19 NOTE — Progress Notes (Signed)
Pt is discharged in the care of self,with [employer]. Stated that she was able to drive herself home. Discharge instructions  With Rx were given to pt. States she understands all instructions well Questions asked and answered. Pt.is without pain ,nausea or vomiting or bleeding.

## 2014-03-26 ENCOUNTER — Telehealth: Payer: Self-pay | Admitting: Cardiovascular Disease

## 2014-03-26 NOTE — Telephone Encounter (Signed)
Spoke with pt, she is concerned about the headaches she is having. She was told by our office to see a PCP for the headaches because it was not related to her bp. She was seen at urgent care and they told her the headaches are due to her bp and they recommended to increase her losartan to 75 mg daily. She does not want to increase the medicine without our approval. Explained 75 mg of losartan is appropriate if her bp was indeed elevated. She can not remember the bp. She is going to track her bp this week. Follow up scheduled to see laura ingold np on Monday 04-02-14. She will bring those bp readings to that appt. Pt agreed with this plan.

## 2014-03-26 NOTE — Telephone Encounter (Signed)
[name] is calling because the doctor increased her bp medications and she is not sure what she should do about it . Please Call

## 2014-03-30 ENCOUNTER — Telehealth: Payer: Self-pay | Admitting: Cardiovascular Disease

## 2014-03-30 MED ORDER — LOSARTAN POTASSIUM 50 MG PO TABS: ORAL_TABLET | ORAL | Status: DC

## 2014-03-30 NOTE — Telephone Encounter (Signed)
Returned call to patient she stated she is very concerned B/P 165/117.Stated she has pain in head and neck.Stated she has appointment with Cecilie Kicks NP 04/02/14 but would like to know if she can increase losartan or does she need to take another B/P medication.Advised will check with DOD Dr.Hochrein and call her back.

## 2014-03-30 NOTE — Telephone Encounter (Signed)
Returned call to patient DOD Dr.Hochrein advised to increase losartan to 50 mg twice a day.Advised to keep appointment with Cecilie Kicks NP Mon 04/02/14 at 4:20 pm.

## 2014-03-30 NOTE — Telephone Encounter (Signed)
Please call,pt blood pressure is up. It is 165/117.Please call asap to advise.

## 2014-03-31 ENCOUNTER — Other Ambulatory Visit: Payer: Self-pay | Admitting: Cardiology

## 2014-04-02 ENCOUNTER — Ambulatory Visit (INDEPENDENT_AMBULATORY_CARE_PROVIDER_SITE_OTHER): Payer: BC Managed Care – PPO | Admitting: Cardiology

## 2014-04-02 ENCOUNTER — Encounter: Payer: Self-pay | Admitting: Cardiology

## 2014-04-02 VITALS — BP 134/90 | HR 83 | Ht 65.0 in | Wt 133.8 lb

## 2014-04-02 DIAGNOSIS — I519 Heart disease, unspecified: Secondary | ICD-10-CM

## 2014-04-02 DIAGNOSIS — I10 Essential (primary) hypertension: Secondary | ICD-10-CM

## 2014-04-02 DIAGNOSIS — R5383 Other fatigue: Secondary | ICD-10-CM

## 2014-04-02 DIAGNOSIS — R079 Chest pain, unspecified: Secondary | ICD-10-CM

## 2014-04-02 DIAGNOSIS — F419 Anxiety disorder, unspecified: Secondary | ICD-10-CM

## 2014-04-02 DIAGNOSIS — Z79899 Other long term (current) drug therapy: Secondary | ICD-10-CM

## 2014-04-02 DIAGNOSIS — M79603 Pain in arm, unspecified: Secondary | ICD-10-CM

## 2014-04-02 MED ORDER — CYCLOBENZAPRINE HCL 5 MG PO TABS: 5.0000 mg | ORAL_TABLET | Freq: Every day | ORAL | Status: DC

## 2014-04-02 NOTE — Assessment & Plan Note (Signed)
Lt arm and lt lateral chest pain.  Added flexaril 5 mg at  [facility] to help with sleep and muscle relaxing.  I have asked her to follow up with Dr. Gwenlyn Found in 6 weeks to re-eval.  I gave enough for 2 weeks and she will let us know if this helped.

## 2014-04-02 NOTE — Telephone Encounter (Signed)
Rx was sent to pharmacy electronically. 

## 2014-04-02 NOTE — Assessment & Plan Note (Signed)
Uncontrolled HTN, now controlled with increase of ARB.  Will check labs today. Anxiety is playing a role in this as well.

## 2014-04-02 NOTE — Assessment & Plan Note (Signed)
Euvolemic, no SOB

## 2014-04-02 NOTE — Patient Instructions (Addendum)
Your physician recommends that you schedule a follow-up appointment in: 6 Weeks with Dr Gwenlyn Found  Your physician recommends that you return for lab work BMP, TSH, Magnesium  Your physician has recommended you make the following change in your medication: Start Flexeril 5 mg at bedtime as needed

## 2014-04-02 NOTE — Progress Notes (Signed)
04/02/2014   PCP: Eli Hose, MD   Chief Complaint  Patient presents with  . Follow-up    elevated BP; pain behind left ear that travels down the neck and into the left arm; some episodes of a sick feeling/blackout feeling, the heart starts to race and head begins to pound followed by chest pain in the LUQ behind the breast; headaches so severe that pt has to lay her head down to stabilize    Primary Cardiologist:Dr. Adora Fridge   HPI:  28 y/o AA female with 5 children. She works at [employer]. We saw in January 2014 with post partum chest pain and HTN. She had an echo in July That showed her EF to be 45-50%. She was continuing to have chest pain. She admits she is a "nervous person" and is under stress at home and at work. She tells me she gets headaches at work and has them check her B/P and its elevated which makes her concerned. She says her B/P has been as high 170/110. She is here today for further evaluation and to review her echo. Fortunately , her echo shows her EF is now WNL 50-55%.   Over last few days she has had elevated BP along with Headache and Lt lat chest pain and lt arm pain.  She was seen by urgent care and recommendations made but pt was anxious of having someone other than cardiology prescribe.  Our office then increased her cozaar to 50 BID.  She has been better since.  Her arm pain continues though.  Her BP is improved.  Pt tearful and anxious.  Afraid she will have a stroke and not be there for her kids.  She has been crying a lot over last 1-2 weeks.  She was reassured that her BP was controlled and the neck pain and arm pain may be muscular skeletal.       Allergies  Allergen Reactions  . Labetalol Other (See Comments)    Alopecia   . Lisinopril Cough    cough    Current Outpatient Prescriptions  Medication Sig Dispense Refill  . diphenhydramine-acetaminophen (TYLENOL PM) 25-500 MG TABS Take 4 tablets by mouth at bedtime as  needed.      Marland Kitchen losartan (COZAAR) 50 MG tablet TAKE 1 TABLET (50 MG TOTAL) BY MOUTH TWICE A DAY      . ondansetron (ZOFRAN) 4 MG tablet Take 1 tablet (4 mg total) by mouth every 6 (six) hours as needed for nausea.  20 tablet  0  . ranitidine (ZANTAC) 150 MG tablet Take 150 mg by mouth daily as needed for heartburn.      . cyclobenzaprine (FLEXERIL) 5 MG tablet Take 1 tablet (5 mg total) by mouth at bedtime.  14 tablet  0   No current facility-administered medications for this visit.    Past Medical History  Diagnosis Date  . Hypertension   . Low BMI   . Postpartum hypertension   . Spotting in first trimester     With SAB  . Herpes     cold sores  . GBS carrier     First pregnancy  . H/O varicella   . Abnormal Pap smear 08/2003    colpo  . Pelvic pain     With pregnancy  . Diastasis recti 12/12/08  . Stress     work and at home - no meds  . H/O chest pain 06/17/11, 06/2013  Resolved 06/2013  . Right ovarian cyst 2003    RT OOPHRECTOMY  . Preeclampsia 2010,2011    POST PARTUM;WAS PUT ON BP MEDS  . Preterm contractions 2007    GIVEN BETAMETHASONE AND PROCARDIA TO STOP CTXS  . Headache(784.0)     migraines;D/T BP  . Carpal tunnel syndrome of right wrist   . Palpitations     Hx only - no problems  . LV dysfunction 12/2012, 06/2013    Hx: EF 45-50%, Resolved by Jan 2015 echo- EF 50-55%  . HTN (hypertension)   . SVD (spontaneous vaginal delivery)     x 5  . Panic attack     HAS BEEN RX'D XANAX  . History of kidney stones     passed stone - no surgery required  . GERD     zantac  . Anemia     History -FeSO4 SUPP IN PAST    Past Surgical History  Procedure Laterality Date  . Ovary removed      right - laparotomy  . Dilation and curettage of uterus  2009  . Breast cyst removal  2005    right  . Tubal ligation  07/03/2012    Procedure: POST PARTUM TUBAL LIGATION;  Surgeon: Delice Lesch, MD;  Location: [facility];  Service: Gynecology;  Laterality: Bilateral;  Bilateral  post partum tubal ligation  . Wisdom tooth extraction    . Laparoscopy N/A 10/11/2013    Procedure: LAPAROSCOPY OPERATIVE WITH REMOVAL OF HYDROSALPINX ;  Surgeon: Betsy Coder, MD;  Location: [facility];  Service: Gynecology;  Laterality: N/A;  . Dilitation & currettage/hystroscopy with thermachoice ablation N/A 10/11/2013    Procedure: DILATATION & CURETTAGE/HYSTEROSCOPY WITH THERMACHOICE ABLATION;  Surgeon: Betsy Coder, MD;  Location: [facility];  Service: Gynecology;  Laterality: N/A;    LTJ:QZESPQZ:RA colds or fevers, some weight loss Skin:no rashes or ulcers HEENT:no blurred vision, no congestion CV:see HPI PUL:see HPI GI:no diarrhea constipation or melena, no indigestion GU:no hematuria, no dysuria MS:no joint pain, no claudication Neuro:no syncope, no lightheadedness Endo:no diabetes, no thyroid disease  Wt Readings from Last 3 Encounters:  04/02/14 133 lb 12.8 oz (60.691 kg)  10/18/13 132 lb (59.875 kg)  10/09/13 137 lb (62.143 kg)    PHYSICAL EXAM BP 134/90  Pulse 83  Ht 5\' 5"  (1.651 m)  Wt 133 lb 12.8 oz (60.691 kg)  BMI 22.27 kg/m2  LMP 03/21/2014 General:Pleasant affect, NAD Skin:Warm and dry, brisk capillary refill HEENT:normocephalic, sclera clear, mucus membranes moist Neck:supple, no JVD, no bruits  Heart:S1S2 RRR without murmur, gallup, rub or click Lungs:clear without rales, rhonchi, or wheezes QTM:AUQJ, non tender, + BS, do not palpate liver spleen or masses Ext:no lower ext edema, 2+ pedal pulses, 2+ radial pulses Neuro:alert and oriented, MAE, follows commands, + facial symmetry  EKG:SR no acute changes from previous HR 83  ASSESSMENT AND PLAN Hypertension Uncontrolled HTN, now controlled with increase of ARB.  Will check labs today. Anxiety is playing a role in this as well.     LV dysfunction, by Echo EF 45-50%- resolved by Echo Jan 2015 Euvolemic, no SOB  Chest pain at rest Lt arm and lt lateral chest pain.  Added flexaril 5 mg at  [facility] to help  with sleep and muscle relaxing.  I have asked her to follow up with Dr. Gwenlyn Found in 6 weeks to re-eval.  I gave enough for 2 weeks and she will let us know if this helped.  Anxiety Anxious about her  health, if her symptoms improve with BP control then ok but if not she may need therapist to hep her handle her stress.

## 2014-04-02 NOTE — Assessment & Plan Note (Signed)
Anxious about her health, if her symptoms improve with BP control then ok but if not she may need therapist to hep her handle her stress.

## 2014-04-04 ENCOUNTER — Telehealth: Payer: Self-pay | Admitting: Cardiology

## 2014-04-04 NOTE — Telephone Encounter (Signed)
Pt called in stating that Mickel Baas prescribed cyclobenzaprine for pt due to a pain in her left arm. She stated that the medication did not help, in fact she was unable to sleep and the pain did not go away. She would like to know if there is another medication that she could take to help her pain. Please call.   FYI: If you call her after 1pm please call her on her work phone  Thanks

## 2014-04-04 NOTE — Telephone Encounter (Signed)
Attempted to call patient's work # per request - no answer, no VM  Left VM on cell # to call back

## 2014-04-05 NOTE — Telephone Encounter (Signed)
Spoke with patient. She states cyclobenzaprine is not working for her - she states she was under the impression she would be able to sleep and the medication would make her MSK pain go away (she c/o pain from head>neck>left arm). She was concerned with was BP related, but I reassured her that per Mickel Baas, NP's assessment her BP was controlled and she is on the appropriate medications. She has a (new) PCP appmt with Dr. Sabra Heck on 11/2. She states she will take tylenol PM for discomfort, as new Rx is not working for her (she describes it makes her heart rate).

## 2014-04-05 NOTE — Telephone Encounter (Signed)
Returning your call from yesterday. If you call after 10:00,please call her at work. ZE#[phone]

## 2014-04-13 NOTE — Telephone Encounter (Signed)
I agree, continue with tylenol pm and see PCP.

## 2014-04-16 ENCOUNTER — Encounter: Payer: Self-pay | Admitting: Cardiology

## 2014-05-23 ENCOUNTER — Ambulatory Visit: Payer: BC Managed Care – PPO | Admitting: Cardiovascular Disease

## 2014-05-27 ENCOUNTER — Emergency Department (HOSPITAL_COMMUNITY)
Admission: EM | Admit: 2014-05-27 | Discharge: 2014-05-28 | Disposition: A | Discharge status: Home / Self Care | Payer: BC Managed Care – PPO | Attending: Emergency Medicine | Admitting: Emergency Medicine

## 2014-05-27 ENCOUNTER — Encounter (HOSPITAL_COMMUNITY): Payer: Self-pay | Admitting: *Deleted

## 2014-05-27 DIAGNOSIS — Z87442 Personal history of urinary calculi: Secondary | ICD-10-CM | POA: Insufficient documentation

## 2014-05-27 DIAGNOSIS — Z9851 Tubal ligation status: Secondary | ICD-10-CM | POA: Diagnosis not present

## 2014-05-27 DIAGNOSIS — Z8739 Personal history of other diseases of the musculoskeletal system and connective tissue: Secondary | ICD-10-CM | POA: Diagnosis not present

## 2014-05-27 DIAGNOSIS — Z3202 Encounter for pregnancy test, result negative: Secondary | ICD-10-CM | POA: Diagnosis not present

## 2014-05-27 DIAGNOSIS — Z8669 Personal history of other diseases of the nervous system and sense organs: Secondary | ICD-10-CM | POA: Diagnosis not present

## 2014-05-27 DIAGNOSIS — I10 Essential (primary) hypertension: Secondary | ICD-10-CM | POA: Diagnosis not present

## 2014-05-27 DIAGNOSIS — R197 Diarrhea, unspecified: Secondary | ICD-10-CM | POA: Diagnosis present

## 2014-05-27 DIAGNOSIS — K219 Gastro-esophageal reflux disease without esophagitis: Secondary | ICD-10-CM | POA: Diagnosis not present

## 2014-05-27 DIAGNOSIS — Z8659 Personal history of other mental and behavioral disorders: Secondary | ICD-10-CM | POA: Insufficient documentation

## 2014-05-27 DIAGNOSIS — Z8742 Personal history of other diseases of the female genital tract: Secondary | ICD-10-CM | POA: Diagnosis not present

## 2014-05-27 DIAGNOSIS — Z862 Personal history of diseases of the blood and blood-forming organs and certain disorders involving the immune mechanism: Secondary | ICD-10-CM | POA: Diagnosis not present

## 2014-05-27 DIAGNOSIS — Z90710 Acquired absence of both cervix and uterus: Secondary | ICD-10-CM | POA: Diagnosis not present

## 2014-05-27 DIAGNOSIS — A084 Viral intestinal infection, unspecified: Secondary | ICD-10-CM

## 2014-05-27 LAB — COMPREHENSIVE METABOLIC PANEL
ALK PHOS: 55 U/L (ref 39–117)
ALT: 5 U/L (ref 0–35)
AST: 14 U/L (ref 0–37)
Albumin: 4 g/dL (ref 3.5–5.2)
Anion gap: 15 (ref 5–15)
BUN: 12 mg/dL (ref 6–23)
CO2: 20 meq/L (ref 19–32)
Calcium: 9.1 mg/dL (ref 8.4–10.5)
Chloride: 103 mEq/L (ref 96–112)
Creatinine, Ser: 0.86 mg/dL (ref 0.50–1.10)
GFR calc non Af Amer: 90 mL/min — ABNORMAL LOW (ref >90)
GLUCOSE: 82 mg/dL (ref 70–99)
POTASSIUM: 3.6 meq/L — AB (ref 3.7–5.3)
SODIUM: 138 meq/L (ref 137–147)
TOTAL PROTEIN: 7.2 g/dL (ref 6.0–8.3)
Total Bilirubin: 0.5 mg/dL (ref 0.3–1.2)

## 2014-05-27 LAB — URINALYSIS, ROUTINE W REFLEX MICROSCOPIC
Bilirubin Urine: NEGATIVE
GLUCOSE, UA: NEGATIVE mg/dL
Ketones, ur: NEGATIVE mg/dL
Leukocytes, UA: NEGATIVE
Nitrite: NEGATIVE
Protein, ur: NEGATIVE mg/dL
Specific Gravity, Urine: 1.029 (ref 1.005–1.030)
Urobilinogen, UA: 0.2 mg/dL (ref 0.0–1.0)
pH: 5.5 (ref 5.0–8.0)

## 2014-05-27 LAB — CBC WITH DIFFERENTIAL/PLATELET
BASOS ABS: 0 10*3/uL (ref 0.0–0.1)
Basophils Relative: 0 % (ref 0–1)
EOS ABS: 0 10*3/uL (ref 0.0–0.7)
EOS PCT: 0 % (ref 0–5)
HCT: 38.5 % (ref 36.0–46.0)
Hemoglobin: 12.2 g/dL (ref 12.0–15.0)
LYMPHS ABS: 1.6 10*3/uL (ref 0.7–4.0)
LYMPHS PCT: 21 % (ref 12–46)
MCH: 25.6 pg — ABNORMAL LOW (ref 26.0–34.0)
MCHC: 31.7 g/dL (ref 30.0–36.0)
MCV: 80.7 fL (ref 78.0–100.0)
Monocytes Absolute: 0.5 10*3/uL (ref 0.1–1.0)
Monocytes Relative: 6 % (ref 3–12)
NEUTROS PCT: 73 % (ref 43–77)
Neutro Abs: 5.6 10*3/uL (ref 1.7–7.7)
Platelets: 178 10*3/uL (ref 150–400)
RBC: 4.77 MIL/uL (ref 3.87–5.11)
RDW: 12.9 % (ref 11.5–15.5)
WBC: 7.7 10*3/uL (ref 4.0–10.5)

## 2014-05-27 LAB — URINE MICROSCOPIC-ADD ON

## 2014-05-27 LAB — POC URINE PREG, ED: Preg Test, Ur: NEGATIVE

## 2014-05-27 MED ORDER — SODIUM CHLORIDE 0.9 % IV BOLUS (SEPSIS): 1000.0000 mL | Freq: Once | INTRAVENOUS | Status: DC

## 2014-05-27 NOTE — ED Notes (Signed)
Awake. Verbally responsive. A/O x4. Resp even and unlabored. No audible adventitious breath sounds noted. ABC's intact. Pt reported having diarrhea x 15 in past 24 hrs and denies n/v. Abd soft/nondistended/tender to palpate. Pt reported generalized discomfort.

## 2014-05-27 NOTE — ED Notes (Addendum)
Patient states yesterday she began to experience generalized body aches/pains, worse in her low back, as well as diarrhea.  Patient's last diarrhea episode was @ 1700 today.  Patient endorses nausea, but denies vomiting.  Patient states oral intake evokes nausea.  Patient has had a fever of 101.8 at home.  Patient has taken Tylenol at home with little relief.  Patient denies SOB, but states she has had some dizziness upon standing.  Patient also c/o headache.  Patient also c/o urinary frequency/urgency that started today.

## 2014-05-27 NOTE — ED Notes (Signed)
Dr. Ashok Cordia at bedside and reported that pt did not want IV flds and plans to take po flds at home.

## 2014-05-27 NOTE — ED Notes (Signed)
Awake. Verbally responsive. A/O x4. Resp even and unlabored. No audible adventitious breath sounds noted. ABC's intact.  

## 2014-05-27 NOTE — Discharge Instructions (Signed)
Gastritis, Adult Gastritis is soreness and swelling (inflammation) of the lining of the stomach. Gastritis can develop as a sudden onset (acute) or long-term (chronic) condition. If gastritis is not treated, it can lead to stomach bleeding and ulcers. CAUSES  Gastritis occurs when the stomach lining is weak or damaged. Digestive juices from the stomach then inflame the weakened stomach lining. The stomach lining may be weak or damaged due to viral or bacterial infections. One common bacterial infection is the Helicobacter pylori infection. Gastritis can also result from excessive alcohol consumption, taking certain medicines, or having too much acid in the stomach.  SYMPTOMS  In some cases, there are no symptoms. When symptoms are present, they may include:  Pain or a burning sensation in the upper abdomen.  Nausea.  Vomiting.  An uncomfortable feeling of fullness after eating. DIAGNOSIS  Your caregiver may suspect you have gastritis based on your symptoms and a physical exam. To determine the cause of your gastritis, your caregiver may perform the following:  Blood or stool tests to check for the H pylori bacterium.  Gastroscopy. A thin, flexible tube (endoscope) is passed down the esophagus and into the stomach. The endoscope has a light and camera on the end. Your caregiver uses the endoscope to view the inside of the stomach.  Taking a tissue sample (biopsy) from the stomach to examine under a microscope. TREATMENT  Depending on the cause of your gastritis, medicines may be prescribed. If you have a bacterial infection, such as an H pylori infection, antibiotics may be given. If your gastritis is caused by too much acid in the stomach, H2 blockers or antacids may be given. Your caregiver may recommend that you stop taking aspirin, ibuprofen, or other nonsteroidal anti-inflammatory drugs (NSAIDs). HOME CARE INSTRUCTIONS  Only take over-the-counter or prescription medicines as directed by  your caregiver.  If you were given antibiotic medicines, take them as directed. Finish them even if you start to feel better.  Drink enough fluids to keep your urine clear or pale yellow.  Avoid foods and drinks that make your symptoms worse, such as:  Caffeine or alcoholic drinks.  Chocolate.  Peppermint or mint flavorings.  Garlic and onions.  Spicy foods.  Citrus fruits, such as oranges, lemons, or limes.  Tomato-based foods such as sauce, chili, salsa, and pizza.  Fried and fatty foods.  Eat small, frequent meals instead of large meals. SEEK IMMEDIATE MEDICAL CARE IF:   You have black or dark red stools.  You vomit blood or material that looks like coffee grounds.  You are unable to keep fluids down.  Your abdominal pain gets worse.  You have a fever.  You do not feel better after 1 week.  You have any other questions or concerns. MAKE SURE YOU:  Understand these instructions.  Will watch your condition.  Will get help right away if you are not doing well or get worse. Document Released: 05/26/2001 Document Revised: 12/01/2011 Document Reviewed: 07/15/2011 [facility] Patient Information 2015 [place], [place]. This information is not intended to replace advice given to you by your health care provider. Make sure you discuss any questions you have with your health care provider.  Food Choices to Help Relieve Diarrhea When you have diarrhea, the foods you eat and your eating habits are very important. Choosing the right foods and drinks can help relieve diarrhea. Also, because diarrhea can last up to 7 days, you need to replace lost fluids and electrolytes (such as sodium, potassium, and chloride)  in order to help prevent dehydration.  WHAT GENERAL GUIDELINES DO I NEED TO FOLLOW?  Slowly drink 1 cup (8 oz) of fluid for each episode of diarrhea. If you are getting enough fluid, your urine will be clear or pale yellow.  Eat starchy foods. Some good choices include  white rice, white toast, pasta, low-fiber cereal, baked potatoes (without the skin), saltine crackers, and bagels.  Avoid large servings of any cooked vegetables.  Limit fruit to two servings per day. A serving is  cup or 1 small piece.  Choose foods with less than 2 g of fiber per serving.  Limit fats to less than 8 tsp (38 g) per day.  Avoid fried foods.  Eat foods that have probiotics in them. Probiotics can be found in certain dairy products.  Avoid foods and beverages that may increase the speed at which food moves through the stomach and intestines (gastrointestinal tract). Things to avoid include:  High-fiber foods, such as dried fruit, raw fruits and vegetables, nuts, seeds, and whole grain foods.  Spicy foods and high-fat foods.  Foods and beverages sweetened with high-fructose corn syrup, honey, or sugar alcohols such as xylitol, sorbitol, and mannitol. WHAT FOODS ARE RECOMMENDED? Grains White rice. White, [place], or pita breads (fresh or toasted), including plain rolls, buns, or bagels. White pasta. Saltine, soda, or graham crackers. Pretzels. Low-fiber cereal. Cooked cereals made with water (such as cornmeal, farina, or cream cereals). Plain muffins. Matzo. Melba toast. Zwieback.  Vegetables Potatoes (without the skin). Strained tomato and vegetable juices. Most well-cooked and canned vegetables without seeds. Tender lettuce. Fruits Cooked or canned applesauce, apricots, cherries, fruit cocktail, grapefruit, peaches, pears, or plums. Fresh bananas, apples without skin, cherries, grapes, cantaloupe, grapefruit, peaches, oranges, or plums.  Meat and Other Protein Products Baked or boiled chicken. Eggs. Tofu. Fish. Seafood. Smooth peanut butter. Ground or well-cooked tender beef, ham, veal, lamb, pork, or poultry.  Dairy Plain yogurt, kefir, and unsweetened liquid yogurt. Lactose-free milk, buttermilk, or soy milk. Plain hard cheese. Beverages Sport drinks. Clear broths.  Diluted fruit juices (except prune). Regular, caffeine-free sodas such as ginger ale. Water. Decaffeinated teas. Oral rehydration solutions. Sugar-free beverages not sweetened with sugar alcohols. Other Bouillon, broth, or soups made from recommended foods.  The items listed above may not be a complete list of recommended foods or beverages. Contact your dietitian for more options. WHAT FOODS ARE NOT RECOMMENDED? Grains Whole grain, whole wheat, bran, or rye breads, rolls, pastas, crackers, and cereals. Wild or brown rice. Cereals that contain more than 2 g of fiber per serving. Corn tortillas or taco shells. Cooked or dry oatmeal. Granola. Popcorn. Vegetables Raw vegetables. Cabbage, broccoli, Brussels sprouts, artichokes, baked beans, beet greens, corn, kale, legumes, peas, sweet potatoes, and yams. Potato skins. Cooked spinach and cabbage. Fruits Dried fruit, including raisins and dates. Raw fruits. Stewed or dried prunes. Fresh apples with skin, apricots, mangoes, pears, raspberries, and strawberries.  Meat and Other Protein Products Chunky peanut butter. Nuts and seeds. Beans and lentils. Berniece Salines.  Dairy High-fat cheeses. Milk, chocolate milk, and beverages made with milk, such as milk shakes. Cream. Ice cream. Sweets and Desserts Sweet rolls, doughnuts, and sweet breads. Pancakes and waffles. Fats and Oils Butter. Cream sauces. Margarine. Salad oils. Plain salad dressings. Olives. Avocados.  Beverages Caffeinated beverages (such as coffee, tea, soda, or energy drinks). Alcoholic beverages. Fruit juices with pulp. Prune juice. Soft drinks sweetened with high-fructose corn syrup or sugar alcohols. Other Coconut. Hot sauce. Chili powder. Mayonnaise.  Gravy. Cream-based or milk-based soups.  The items listed above may not be a complete list of foods and beverages to avoid. Contact your dietitian for more information. WHAT SHOULD I DO IF I BECOME DEHYDRATED? Diarrhea can sometimes lead to  dehydration. Signs of dehydration include dark urine and dry mouth and skin. If you think you are dehydrated, you should rehydrate with an oral rehydration solution. These solutions can be purchased at pharmacies, retail stores, or online.  Drink -1 cup (120-240 mL) of oral rehydration solution each time you have an episode of diarrhea. If drinking this amount makes your diarrhea worse, try drinking smaller amounts more often. For example, drink 1-3 tsp (5-15 mL) every 5-10 minutes.  A general rule for staying hydrated is to drink 1-2 L of fluid per day. Talk to your health care provider about the specific amount you should be drinking each day. Drink enough fluids to keep your urine clear or pale yellow. Document Released: 08/22/2003 Document Revised: 06/06/2013 Document Reviewed: 04/24/2013 [facility] Patient Information 2015 [place], [place]. This information is not intended to replace advice given to you by your health care provider. Make sure you discuss any questions you have with your health care provider.

## 2014-05-28 NOTE — ED Provider Notes (Signed)
CSN: [id number]     Arrival date & time 05/27/14  1828 History   First MD Initiated Contact with Patient 05/27/14 2253     Chief Complaint  Patient presents with  . Generalized Body Aches  . Diarrhea   [name] is a 28 y.o. female with history of hypertension, kidney stones and GERD who presents to the emergency department complaining of generalized body aches as well as diarrhea since yesterday. Patient reports multiple episodes of watery diarrhea today and has had 2 episodes of diarrhea since her arrival to the emergency department today. Patient reports that her son was sick at home with vomiting and diarrhea. Patient is also reporting generalized abdominal cramping. Patient also reports she had a fever today with a maximum temperature 101.8 at 2 PM today. Patient reports taking Tylenol at 4 PM today with some relief. Patient reports she's been drinking Powerade and water at home. Patient reports she has had some urinary frequency started today but denies dysuria. The patient denies vomiting, hematochezia, melena, dysuria, hematuria, cough, chest pain, shortness of breath, dizziness, lightheadedness, or rashes.    (Consider location/radiation/quality/duration/timing/severity/associated sxs/prior Treatment) HPI  Past Medical History  Diagnosis Date  . Hypertension   . Low BMI   . Postpartum hypertension   . Spotting in first trimester     With SAB  . Herpes     cold sores  . GBS carrier     First pregnancy  . H/O varicella   . Abnormal Pap smear 08/2003    colpo  . Pelvic pain     With pregnancy  . Diastasis recti 12/12/08  . Stress     work and at home - no meds  . H/O chest pain 06/17/11, 06/2013    Resolved 06/2013  . Right ovarian cyst 2003    RT OOPHRECTOMY  . Preeclampsia 2010,2011    POST PARTUM;WAS PUT ON BP MEDS  . Preterm contractions 2007    GIVEN BETAMETHASONE AND PROCARDIA TO STOP CTXS  . Headache(784.0)     migraines;D/T BP  . Carpal tunnel syndrome of  right wrist   . Palpitations     Hx only - no problems  . LV dysfunction 12/2012, 06/2013    Hx: EF 45-50%, Resolved by Jan 2015 echo- EF 50-55%  . HTN (hypertension)   . SVD (spontaneous vaginal delivery)     x 5  . Panic attack     HAS BEEN RX'D XANAX  . History of kidney stones     passed stone - no surgery required  . GERD     zantac  . Anemia     History -FeSO4 SUPP IN PAST   Past Surgical History  Procedure Laterality Date  . Ovary removed      right - laparotomy  . Dilation and curettage of uterus  2009  . Breast cyst removal  2005    right  . Tubal ligation  07/03/2012    Procedure: POST PARTUM TUBAL LIGATION;  Surgeon: Delice Lesch, MD;  Location: [facility];  Service: Gynecology;  Laterality: Bilateral;  Bilateral post partum tubal ligation  . Wisdom tooth extraction    . Laparoscopy N/A 10/11/2013    Procedure: LAPAROSCOPY OPERATIVE WITH REMOVAL OF HYDROSALPINX ;  Surgeon: Betsy Coder, MD;  Location: [facility];  Service: Gynecology;  Laterality: N/A;  . Dilitation & currettage/hystroscopy with thermachoice ablation N/A 10/11/2013    Procedure: DILATATION & CURETTAGE/HYSTEROSCOPY WITH THERMACHOICE ABLATION;  Surgeon: Betsy Coder,  MD;  Location: [facility];  Service: Gynecology;  Laterality: N/A;   Family History  Problem Relation Age of Onset  . Heart disease Mother     CHF  . Hypertension Mother   . Asthma Mother     CHILDHOOD  . Diabetes Mother   . Kidney disease Mother     dialysis  . Depression Mother   . Alcohol abuse Mother   . Drug abuse Mother   . Heart failure Mother   . Hypertension Father   . Alcohol abuse Father   . Drug abuse Father   . Asthma Sister   . COPD Sister     chronic bronchitis  . Depression Sister     ATTEMPTED SUICIDE  . Arrhythmia Sister     needs ablation   . Hypertension Maternal Grandmother   . Diabetes Maternal Grandmother    History  Substance Use Topics  . Smoking status: Never Smoker   . Smokeless tobacco: Never  Used  . Alcohol Use: No   OB History    Gravida Para Term Preterm AB TAB SAB Ectopic Multiple Living   6 5 5  1  1   5      Review of Systems  Constitutional: Positive for fever. Negative for chills.  HENT: Negative for congestion, ear pain, sore throat and trouble swallowing.   Eyes: Negative for pain and visual disturbance.  Respiratory: Negative for cough, shortness of breath and wheezing.   Cardiovascular: Negative for chest pain and palpitations.  Gastrointestinal: Positive for abdominal pain and diarrhea. Negative for nausea, vomiting, blood in stool and abdominal distention.  Genitourinary: Positive for frequency. Negative for dysuria, hematuria, flank pain, vaginal bleeding, vaginal discharge and difficulty urinating.  Musculoskeletal: Positive for myalgias. Negative for back pain and neck pain.  Skin: Negative for rash and wound.  Neurological: Negative for dizziness, weakness, light-headedness and headaches.  All other systems reviewed and are negative.     Allergies  Labetalol and Lisinopril  Home Medications   Prior to Admission medications   Medication Sig Start Date End Date Taking? Authorizing Provider  acetaminophen (TYLENOL) 500 MG tablet Take 1,000 mg by mouth every 6 (six) hours as needed for mild pain.   Yes Historical Provider, MD  gabapentin (NEURONTIN) 600 MG tablet Take 600 mg by mouth at bedtime.   Yes Historical Provider, MD  losartan (COZAAR) 50 MG tablet TAKE 1 TABLET (50 MG TOTAL) BY MOUTH TWICE A DAY 04/02/14  Yes Lorretta Harp, MD  ranitidine (ZANTAC) 150 MG tablet Take 150 mg by mouth daily as needed for heartburn.   Yes Historical Provider, MD  Vitamin D, Ergocalciferol, (DRISDOL) 50000 UNITS CAPS capsule Take 1 capsule by mouth 2 (two) times a week. Nancy Fetter and Thurs 04/23/14  Yes Historical Provider, MD  cyclobenzaprine (FLEXERIL) 5 MG tablet Take 1 tablet (5 mg total) by mouth at bedtime. Patient not taking: Reported on 05/27/2014 04/02/14   Isaiah Serge, NP  ondansetron (ZOFRAN) 4 MG tablet Take 1 tablet (4 mg total) by mouth every 6 (six) hours as needed for nausea. Patient not taking: Reported on 05/27/2014 10/19/13   Earnstine Regal, PA-C   BP 136/93 mmHg  Pulse 78  Temp(Src) 100.4 F (38 C) (Oral)  Resp 18  SpO2 100%  LMP 05/16/2014 (Approximate)  Breastfeeding? No Physical Exam  Constitutional: She is oriented to person, place, and time. She appears well-developed and well-nourished. No distress.  HENT:  Head: Normocephalic and atraumatic.  Mouth/Throat: Oropharynx is  clear and moist. No oropharyngeal exudate.  Eyes: Conjunctivae are normal. Pupils are equal, round, and reactive to light. Right eye exhibits no discharge. Left eye exhibits no discharge.  Neck: Normal range of motion. Neck supple.  Cardiovascular: Normal rate, regular rhythm, normal heart sounds and intact distal pulses.  Exam reveals no gallop and no friction rub.   No murmur heard. Pulmonary/Chest: Effort normal and breath sounds normal. No respiratory distress. She has no wheezes. She has no rales.  Abdominal: Soft. Bowel sounds are normal. She exhibits no distension and no mass. There is tenderness. There is no rebound and no guarding.  His abdomen is soft. Bowel sounds are intact. Patient's abdomen is mildly generally tender to palpation. No rebound tenderness. Negative psoas and obturator sign.  Musculoskeletal: She exhibits no edema.  Lymphadenopathy:    She has no cervical adenopathy.  Neurological: She is alert and oriented to person, place, and time. Coordination normal.  Skin: Skin is warm and dry. No rash noted. She is not diaphoretic. No erythema. No pallor.  Psychiatric: She has a normal mood and affect. Her behavior is normal.  Nursing note and vitals reviewed.   ED Course  Procedures (including critical care time) Labs Review Labs Reviewed  CBC WITH DIFFERENTIAL - Abnormal; Notable for the following:    MCH 25.6 (*)    All other  components within normal limits  COMPREHENSIVE METABOLIC PANEL - Abnormal; Notable for the following:    Potassium 3.6 (*)    GFR calc non Af Amer 90 (*)    All other components within normal limits  URINALYSIS, ROUTINE W REFLEX MICROSCOPIC - Abnormal; Notable for the following:    Color, Urine AMBER (*)    APPearance CLOUDY (*)    Hgb urine dipstick SMALL (*)    All other components within normal limits  URINE MICROSCOPIC-ADD ON  POC URINE PREG, ED    Imaging Review No results found.   EKG Interpretation None      Filed Vitals:   05/27/14 1900 05/27/14 2329 05/27/14 2332  BP: 128/93  136/93  Pulse: 105  78  Temp: 100.4 F (38 C)    TempSrc: Oral    Resp: 20  18  SpO2: 100% 99% 100%   I personally checked this patients temperature during my evaluation and it was 98.7.   MDM   Final diagnoses:  Viral gastroenteritis   DAMIAN HOFSTRA is a 28 y.o. female with history of hypertension, kidney stones and GERD who presents to the emergency department complaining of generalized body aches as well as diarrhea since yesterday. Patient reports 2 episodes of watery diarrhea since arrival to the ED. Patient is afebrile and nontoxic appearing. Patient is tolerating oral fluids well. The patient's CBC and CMP are unremarkable. Patient has a negative urine pregnancy test. The patient's urinalysis showed a small amount hemoglobin, but was otherwise unremarkable. Patient's abdomen is soft. Patient put she is looking for reassurance that there is no need for further testing. The patient is not willing to stay for IV hydration. Education provided on symptomatic treatment of diarrhea. Advised patient to follow-up with her primary care physician this week. Advised patient to return to the emergency department if she has not improved 24 hours. Advised patient return to the emergency department with new or worsening symptoms or new concerns. The patient verbalizes understanding and agreement with  plan.  This patient was discussed with and evaluated by Dr. Ashok Cordia agrees with assessment and plan.  Hanley Hays, PA-C 05/28/14 [place], MD 05/30/14 [phone]

## 2014-06-20 ENCOUNTER — Ambulatory Visit: Payer: BC Managed Care – PPO | Admitting: Cardiovascular Disease

## 2014-07-16 ENCOUNTER — Telehealth: Payer: Self-pay | Admitting: Cardiovascular Disease

## 2014-07-19 ENCOUNTER — Telehealth: Payer: Self-pay | Admitting: Cardiovascular Disease

## 2014-07-19 NOTE — Telephone Encounter (Signed)
Pt called to report symptoms, had left arm pain and some associated left sided chest pain that lasted for a few minutes this AM at work.  She denies SOB, neck pain, dizziness, syncope, but states she felt nervous. Denies CP currently.  She notes this typically squeezing pain sensation in left arm has happened intermittently for a couple of months.  PCP had put her on Gabapentin 2+ months ago to see if arm pain improved. She notes some benefit with medication.  She has been evaluated for chest pain in the past and followed by Dr. Gwenlyn Found. Called due to concern over relationship of symptoms. She does state history of anxiety also.  We discussed S&S, her BP readings when checked at work today were 138/96 this AM and 142/95 this afternoon.  She was OK to wait for evaluation, cites feeling much better this afternoon.   I notified her I could add her to Dr. Kennon Holter schedule for tomorrow as she had been due for a ROV this month anyway. She was amenable to this and will call if she cannot make appt.   We discussed reporting to ED for sudden new or worsening symptoms.

## 2014-07-19 NOTE — Telephone Encounter (Signed)
Pt called back to say she could not make appt tomorrow. Will cancel. Offered to reschedule, she declined at this time.

## 2014-07-19 NOTE — Telephone Encounter (Signed)
Left message to call back  

## 2014-07-19 NOTE — Telephone Encounter (Signed)
Pt called in stating that since taking her Losartan she has not been feeling well. Her symptoms became noticeable on 2/1.  - chest pain - SOB - pain in lft arm -nausea - "weird feeling"  She says that when calling her to ask on of the nurse's to page her. Please f/u  Thanks

## 2014-07-19 NOTE — Telephone Encounter (Signed)
Returning your call.t

## 2014-07-20 ENCOUNTER — Ambulatory Visit: Payer: Medicaid Other | Admitting: Cardiovascular Disease

## 2014-07-25 NOTE — Telephone Encounter (Signed)
Closed encounter °

## 2014-07-27 ENCOUNTER — Ambulatory Visit: Payer: Medicaid Other | Admitting: Cardiovascular Disease

## 2014-12-19 ENCOUNTER — Telehealth: Payer: Self-pay | Admitting: Cardiovascular Disease

## 2014-12-19 NOTE — Telephone Encounter (Signed)
Denaya is calling because her bp is 150/100 and she had last evening a sharp pain and the pain went into her left eye and her left eye is blurry  . Please call   Thanks

## 2014-12-19 NOTE — Telephone Encounter (Signed)
Pt. Encouraged to call her PCP or eye doctor and if the pain and if her BP continues to be a problem to go to the ER, pt sates her bp normally is around  120/70 , pt. instructed to take her bp two  Times a day for the next week and report. Pt. Agreed with plan

## 2015-03-12 ENCOUNTER — Telehealth: Payer: Self-pay | Admitting: Cardiovascular Disease

## 2015-03-12 NOTE — Telephone Encounter (Signed)
Patient has had CP since Saturday, off and on.  Pain is left sided, discrete spot under arm/breast. She notes that the pain is reproducible, worse if palpated. Pain yesterday was severe at end of day. She notes better today.  She denies SOB, dizziness, other symptoms w/ this.  Patient has not checked BP or HR. She does note hx of anxiety, panic attacks.  Pt states she will not go to ER about this, cannot afford the copay.  She had a similar episode of CP a few months ago which she called about. At the time, add-on appt was offered, she ultimately could not make it due to her work schedule.  I offered add-on appt soonest available - Butch Penny at [facility] scheduled for [employer] tomorrow (9/28) at 3:40pm.  Gave patient appt time/loc/provider - she states she will be able to make this appointment.  Pt instructed to go to ED if symptoms worsen or additional symptoms (i.e. SOB). She voiced understanding.  Will route to [facility] (Dr. Debara Pickett) for any additional advice.

## 2015-03-12 NOTE — Telephone Encounter (Signed)
Agree with plan. Thanks.  Dr. Lemmie Evens

## 2015-03-12 NOTE — Progress Notes (Signed)
Cardiology Office Note   Date:  03/13/2015   ID:  Banks, Amber 01-26-1986, MRN [id number]  PCP:  Amber Rima, MD  Cardiologist:  Dr. Quay Burow   Electrophysiologist:  n/a  Chief Complaint  Patient presents with  . Chest Pain     History of Present Illness: [name] is a 29 y.o. female with a hx of postpartum chest pain, hypertension and LV dysfunction. Previous EF 45-50%. This improved by echocardiogram 1/15 with EF 50-55%.  Last seen in clinic by Amber Kicks, NP 10/15.     She called in recently with complaints of chest discomfort. She was added on for evaluation. She's had chest symptoms over the past several months. Recently it has gotten worse. She describes left-sided neck and left arm discomfort with associated headache. Symptoms radiate into her left chest around her breast. Symptoms may last for days. She denies exertional dyspnea. She denies exertional chest symptoms. However, she seems to note more symptoms while she is active. She denies syncope. She does note some dizziness. This is a spinning quality at times. She does have palpitations sometimes when she awakens in the morning. She denies orthopnea, PND or edema.   Studies/Reports Reviewed Today:  Echo 1/15 EF 50-55%, normal wall motion, normal diastolic function, redundant anterior leaflet chordal apparatus of the mitral valve with systolic anterior motion and no LVOT gradient    Past Medical History  Diagnosis Date  . Hypertension   . Low BMI   . Postpartum hypertension   . Spotting in first trimester     With SAB  . Herpes     cold sores  . GBS carrier     First pregnancy  . H/O varicella   . Abnormal Pap smear 08/2003    colpo  . Pelvic pain     With pregnancy  . Diastasis recti 12/12/08  . Stress     work and at home - no meds  . H/O chest pain 06/17/11, 06/2013    Resolved 06/2013  . Right ovarian cyst 2003    RT OOPHRECTOMY  . Preeclampsia 2010,2011    POST PARTUM;WAS  PUT ON BP MEDS  . Preterm contractions 2007    GIVEN BETAMETHASONE AND PROCARDIA TO STOP CTXS  . Headache(784.0)     migraines;D/T BP  . Carpal tunnel syndrome of right wrist   . Palpitations     Hx only - no problems  . LV dysfunction 12/2012, 06/2013    Hx: EF 45-50%, Resolved by Jan 2015 echo- EF 50-55%  . HTN (hypertension)   . SVD (spontaneous vaginal delivery)     x 5  . Panic attack     HAS BEEN RX'D XANAX  . History of kidney stones     passed stone - no surgery required  . GERD     zantac  . Anemia     History -FeSO4 SUPP IN PAST    Past Surgical History  Procedure Laterality Date  . Ovary removed      right - laparotomy  . Dilation and curettage of uterus  2009  . Breast cyst removal  2005    right  . Tubal ligation  07/03/2012    Procedure: POST PARTUM TUBAL LIGATION;  Surgeon: Delice Lesch, MD;  Location: [facility];  Service: Gynecology;  Laterality: Bilateral;  Bilateral post partum tubal ligation  . Wisdom tooth extraction    . Laparoscopy N/A 10/11/2013    Procedure: LAPAROSCOPY OPERATIVE WITH  REMOVAL OF HYDROSALPINX ;  Surgeon: Betsy Coder, MD;  Location: [facility];  Service: Gynecology;  Laterality: N/A;  . Dilitation & currettage/hystroscopy with thermachoice ablation N/A 10/11/2013    Procedure: DILATATION & CURETTAGE/HYSTEROSCOPY WITH THERMACHOICE ABLATION;  Surgeon: Betsy Coder, MD;  Location: [facility];  Service: Gynecology;  Laterality: N/A;     Current Outpatient Prescriptions  Medication Sig Dispense Refill  . acetaminophen (TYLENOL) 500 MG tablet Take 1,000 mg by mouth every 6 (six) hours as needed for mild pain.    Marland Kitchen amLODipine (NORVASC) 5 MG tablet TAKE 1 TABLET EVERY DAY    . losartan (COZAAR) 50 MG tablet TAKE 1 TABLET (50 MG TOTAL) BY MOUTH TWICE A DAY    . sucralfate (CARAFATE) 1 G tablet Take 1 g by mouth daily as needed (for stomach problems). Patient takes 1 gm by once daily on empty stomach     No current facility-administered  medications for this visit.    Allergies:   Labetalol and Lisinopril    Social History:  The patient  reports that she has never smoked. She has never used smokeless tobacco. She reports that she does not drink alcohol or use illicit drugs.   Family History:  The patient's family history includes Alcohol abuse in her father and mother; Arrhythmia in her sister; Asthma in her mother and sister; COPD in her sister; Depression in her mother and sister; Diabetes in her maternal grandmother and mother; Drug abuse in her father and mother; Heart disease in her mother; Heart failure in her mother; Hypertension in her father, maternal grandmother, and mother; Kidney disease in her mother.    ROS:   Please see the history of present illness.   Review of Systems  Cardiovascular: Positive for chest pain and irregular heartbeat.      PHYSICAL EXAM: VS:  BP 124/68 mmHg  Pulse 98  Ht 5\' 5"  (1.651 m)  Wt 149 lb 12.8 oz (67.949 kg)  BMI 24.93 kg/m2   BP: L 128/90; R 128/90  Wt Readings from Last 3 Encounters:  03/13/15 149 lb 12.8 oz (67.949 kg)  04/02/14 133 lb 12.8 oz (60.691 kg)  10/18/13 132 lb (59.875 kg)     GEN: Well nourished, well developed, in no acute distress HEENT: normal Neck: no JVD, no carotid bruits, no masses Cardiac:  Normal S1/S2, RRR; no murmur ,  no rubs or gallops, no edema   Respiratory:  clear to auscultation bilaterally, no wheezing, rhonchi or rales. GI: soft, nontender, nondistended, + BS MS: no deformity or atrophy, mild tenderness left chest in the mid axillary line Skin: warm and dry  Neuro:  CNs II-XII intact, Strength and sensation are intact, biceps and brachioradialis DTRs 2+ bilaterally Psych: Normal affect   EKG:  EKG is ordered today.  It demonstrates:   NSR, HR 98, normal axis, no ST changes, QTc 446, no change from prior tracing   Recent Labs: 05/27/2014: ALT 5; BUN 12; Creatinine, Ser 0.86; Hemoglobin 12.2; Platelets 178; Potassium 3.6*; Sodium  138    Lipid Panel No results found for: CHOL, TRIG, HDL, CHOLHDL, VLDL, LDLCALC, LDLDIRECT    ASSESSMENT AND PLAN:  1. Chest Pain: Symptoms are somewhat atypical for ischemia. However, she has noted some symptoms while active. The way she describes her symptoms, I suspect there is more of a cervicogenic etiology. Possibly myofascial pain. She is tender in the mid axillary line om the left. ECG is normal. By Well's criteria, she is low risk.  PERC is negative. Bilateral BPs are equal.  No sudden CP.  O2 is 98%.  She denies any recent travels.  -  Arrange echocardiogram  -  Arrange ETT-echo  -  BMET, CBC, TSH  -  Arrange chest x-ray  -  If above workup negative, consider referral to neurology  2. Palpitations: Suspect related to anxiety. Check BMET, TSH. If continues, consider event monitor.    Medication Changes: Current medicines are reviewed at length with the patient today.  Concerns regarding medicines are as outlined above.  The following changes have been made:   Discontinued Medications   CYCLOBENZAPRINE (FLEXERIL) 5 MG TABLET    Take 1 tablet (5 mg total) by mouth at bedtime.   GABAPENTIN (NEURONTIN) 600 MG TABLET    Take 600 mg by mouth at bedtime.   ONDANSETRON (ZOFRAN) 4 MG TABLET    Take 1 tablet (4 mg total) by mouth every 6 (six) hours as needed for nausea.   RANITIDINE (ZANTAC) 150 MG TABLET    Take 150 mg by mouth daily as needed for heartburn.   VITAMIN D, ERGOCALCIFEROL, (DRISDOL) 50000 UNITS CAPS CAPSULE    Take 1 capsule by mouth 2 (two) times a week. Nancy Fetter and Thurs   Modified Medications   No medications on file   New Prescriptions   No medications on file    Labs/ tests ordered today include:   Orders Placed This Encounter  Procedures  . DG Chest 2 View  . Basic Metabolic Panel (BMET)  . CBC w/Diff  . TSH  . EKG 12-Lead  . Echocardiogram  . Echo stress      Disposition:    FU with Dr. Quay Burow 1 month.     Signed, Versie Starks,  MHS 03/13/2015 5:12 PM    [facility] [place], [place], [place]  [address] Phone: [phone]; Fax: [phone]

## 2015-03-12 NOTE — Telephone Encounter (Signed)
Pt c/o of Chest Pain: STAT if CP now or developed within 24 hours  1. Are you having CP right now?yes   2. Are you experiencing any other symptoms (ex. SOB, nausea, vomiting, sweating)? No  3. How long have you been experiencing CP? Since Saturday 03/09/15  4. Is your CP continuous or coming and going? No  5. Have you taken Nitroglycerin? no?

## 2015-03-13 ENCOUNTER — Ambulatory Visit (INDEPENDENT_AMBULATORY_CARE_PROVIDER_SITE_OTHER): Payer: BLUE CROSS/BLUE SHIELD | Admitting: Physician Assistant

## 2015-03-13 ENCOUNTER — Encounter: Payer: Self-pay | Admitting: Physician Assistant

## 2015-03-13 VITALS — BP 124/68 | HR 98 | Ht 65.0 in | Wt 149.8 lb

## 2015-03-13 DIAGNOSIS — R079 Chest pain, unspecified: Secondary | ICD-10-CM

## 2015-03-13 DIAGNOSIS — R002 Palpitations: Secondary | ICD-10-CM

## 2015-03-13 NOTE — Telephone Encounter (Signed)
See OV note from today. Richardson Dopp, PA-C   03/13/2015 5:57 PM

## 2015-03-13 NOTE — Patient Instructions (Signed)
Medication Instructions:  Your physician recommends that you continue on your current medications as directed. Please refer to the Current Medication list given to you today.   Labwork: TODAY; BMET, CBC W/DIFF, TSH  Testing/Procedures: 1. Your physician has requested that you have an echocardiogram. Echocardiography is a painless test that uses sound waves to create images of your heart. It provides your doctor with information about the size and shape of your heart and how well your heart's chambers and valves are working. This procedure takes approximately one hour. There are no restrictions for this procedure.  2. Your physician has requested that you have a stress echocardiogram. For further information please visit [email or web address]. Please follow instruction sheet as given.  3. A chest x-ray takes a picture of the organs and structures inside the chest, including the heart, lungs, and blood vessels. This test can show several things, including, whether the heart is enlarges; whether fluid is building up in the lungs; and whether pacemaker / defibrillator leads are still in place.   Follow-Up: 1 MONTH WITH DR. Gwenlyn Found AT THE [facility]  Any Other Special Instructions Will Be Listed Below (If Applicable).

## 2015-03-14 LAB — BASIC METABOLIC PANEL
BUN: 13 mg/dL (ref 6–23)
CO2: 26 mEq/L (ref 19–32)
Calcium: 9.6 mg/dL (ref 8.4–10.5)
Chloride: 102 mEq/L (ref 96–112)
Creatinine, Ser: 0.83 mg/dL (ref 0.40–1.20)
GFR: 102.89 mL/min (ref >60.00)
Glucose, Bld: 92 mg/dL (ref 70–99)
POTASSIUM: 4.2 meq/L (ref 3.5–5.1)
SODIUM: 136 meq/L (ref 135–145)

## 2015-03-14 LAB — CBC WITH DIFFERENTIAL/PLATELET
Basophils Absolute: 0 10*3/uL (ref 0.0–0.1)
Basophils Relative: 0.5 % (ref 0.0–3.0)
EOS ABS: 0.1 10*3/uL (ref 0.0–0.7)
EOS PCT: 0.9 % (ref 0.0–5.0)
HEMATOCRIT: 39.9 % (ref 36.0–46.0)
HEMOGLOBIN: 12.9 g/dL (ref 12.0–15.0)
Lymphocytes Relative: 34 % (ref 12.0–46.0)
Lymphs Abs: 3.2 10*3/uL (ref 0.7–4.0)
MCHC: 32.4 g/dL (ref 30.0–36.0)
MCV: 78.6 fl (ref 78.0–100.0)
MONOS PCT: 3.8 % (ref 3.0–12.0)
Monocytes Absolute: 0.4 10*3/uL (ref 0.1–1.0)
NEUTROS ABS: 5.7 10*3/uL (ref 1.4–7.7)
Neutrophils Relative %: 60.8 % (ref 43.0–77.0)
PLATELETS: 290 10*3/uL (ref 150.0–400.0)
RBC: 5.08 Mil/uL (ref 3.87–5.11)
RDW: 13.9 % (ref 11.5–15.5)
WBC: 9.4 10*3/uL (ref 4.0–10.5)

## 2015-03-14 LAB — TSH: TSH: 0.88 u[IU]/mL (ref 0.35–4.50)

## 2015-03-15 ENCOUNTER — Telehealth: Payer: Self-pay | Admitting: *Deleted

## 2015-03-15 NOTE — Telephone Encounter (Signed)
Pt notified of lab results by phone with verbal undertstanding. Pt said she will try to get CXR today.

## 2015-03-27 ENCOUNTER — Ambulatory Visit (HOSPITAL_COMMUNITY): Payer: BLUE CROSS/BLUE SHIELD | Attending: Physician Assistant

## 2015-03-27 ENCOUNTER — Other Ambulatory Visit: Payer: Self-pay

## 2015-03-27 ENCOUNTER — Ambulatory Visit (HOSPITAL_BASED_OUTPATIENT_CLINIC_OR_DEPARTMENT_OTHER): Payer: BLUE CROSS/BLUE SHIELD

## 2015-03-27 ENCOUNTER — Encounter: Payer: Self-pay | Admitting: Physician Assistant

## 2015-03-27 ENCOUNTER — Other Ambulatory Visit: Payer: Self-pay | Admitting: Physician Assistant

## 2015-03-27 ENCOUNTER — Telehealth: Payer: Self-pay | Admitting: *Deleted

## 2015-03-27 DIAGNOSIS — R002 Palpitations: Secondary | ICD-10-CM

## 2015-03-27 DIAGNOSIS — R9439 Abnormal result of other cardiovascular function study: Secondary | ICD-10-CM

## 2015-03-27 DIAGNOSIS — R079 Chest pain, unspecified: Secondary | ICD-10-CM | POA: Insufficient documentation

## 2015-03-27 DIAGNOSIS — I10 Essential (primary) hypertension: Secondary | ICD-10-CM

## 2015-03-27 NOTE — Telephone Encounter (Signed)
Lmptcb go over echo results

## 2015-03-28 NOTE — Telephone Encounter (Signed)
Pt notified of stress echo results and findings by phone. Pt advised per Brynda Rim. PA and Dr. Gwenlyn Found that she will need a Cardiac CT-A. I went over that this will help the dr see if there are any blockages w/o doing a cardiac cath 1st. Pt teary on phone. I assured pt that we will do our very best to help take care of her. Pt said thank you to our office for all that we have done for her. I thanked pt for her kind words for our office. I will have [facility] call her and schedule her for Cardiac CT-A to be done at [facility] and to be read by either Dr. Johnsie Cancel or Dr. Meda Coffee. Pt agreeable to plan of care.

## 2015-03-28 NOTE — Telephone Encounter (Signed)
F/u  Pt returning Carol's phone call. Pt stated when calling back she may have to hold call briefly while she gets away from her desk. Please call back and discuss.

## 2015-03-28 NOTE — Progress Notes (Signed)
Thx.  JJB 

## 2015-03-29 ENCOUNTER — Encounter: Payer: Self-pay | Admitting: Cardiology

## 2015-03-29 ENCOUNTER — Telehealth: Payer: Self-pay | Admitting: *Deleted

## 2015-03-29 ENCOUNTER — Encounter: Payer: Self-pay | Admitting: Physician Assistant

## 2015-03-29 NOTE — Telephone Encounter (Signed)
Pt notified of echo results by phone. Pt sattes she has not heard back yet about being scheduled for Cardiac CT-A. I advised pt that I sent a message to Smith Robert. at [facility] to call and schedule. Pt said thank you.

## 2015-04-03 ENCOUNTER — Telehealth: Payer: Self-pay | Admitting: *Deleted

## 2015-04-03 ENCOUNTER — Encounter: Payer: Self-pay | Admitting: Physician Assistant

## 2015-04-03 ENCOUNTER — Ambulatory Visit (HOSPITAL_COMMUNITY)
Admission: RE | Admit: 2015-04-03 | Discharge: 2015-04-03 | Disposition: A | Discharge status: Home / Self Care | Payer: BLUE CROSS/BLUE SHIELD | Source: Ambulatory Visit | Attending: Physician Assistant | Admitting: Physician Assistant

## 2015-04-03 DIAGNOSIS — R079 Chest pain, unspecified: Secondary | ICD-10-CM | POA: Insufficient documentation

## 2015-04-03 DIAGNOSIS — R9439 Abnormal result of other cardiovascular function study: Secondary | ICD-10-CM | POA: Diagnosis not present

## 2015-04-03 DIAGNOSIS — I10 Essential (primary) hypertension: Secondary | ICD-10-CM

## 2015-04-03 MED ORDER — IOHEXOL 350 MG/ML SOLN: 80.0000 mL | Freq: Once | INTRAVENOUS | Status: DC | PRN

## 2015-04-03 MED ORDER — NITROGLYCERIN 0.4 MG SL SUBL
SUBLINGUAL_TABLET | SUBLINGUAL | Status: AC
  Administered 2015-04-03: 0.4 mg via SUBLINGUAL
  Filled 2015-04-03: qty 2

## 2015-04-03 MED ORDER — METOPROLOL TARTRATE 1 MG/ML IV SOLN
INTRAVENOUS | Status: AC
  Administered 2015-04-03: 2.5 mg via INTRAVENOUS
  Filled 2015-04-03: qty 5

## 2015-04-03 MED ORDER — METOPROLOL TARTRATE 1 MG/ML IV SOLN
INTRAVENOUS | Status: AC
  Administered 2015-04-03: 5 mg via INTRAVENOUS
  Filled 2015-04-03: qty 5

## 2015-04-03 MED ORDER — METOPROLOL TARTRATE 1 MG/ML IV SOLN
5.0000 mg | Freq: Once | INTRAVENOUS | Status: AC
  Administered 2015-04-03: 5 mg via INTRAVENOUS

## 2015-04-03 MED ORDER — NITROGLYCERIN 0.4 MG SL SUBL
0.4000 mg | SUBLINGUAL_TABLET | SUBLINGUAL | Status: DC | PRN
  Administered 2015-04-03: 0.4 mg via SUBLINGUAL

## 2015-04-03 MED ORDER — METOPROLOL TARTRATE 1 MG/ML IV SOLN
2.5000 mg | Freq: Once | INTRAVENOUS | Status: AC
  Administered 2015-04-03: 2.5 mg via INTRAVENOUS

## 2015-04-03 NOTE — Telephone Encounter (Signed)
Pt notified of CT CORONARY MORPH W/CTA COR W/SCORE W/CA W/CM &/OR WO/CM normal results by phone today with verbal understanding. pt was very happy to get this great news. Pt said thank you for all we do.

## 2015-04-04 MED ORDER — IOHEXOL 350 MG/ML SOLN
80.0000 mL | Freq: Once | INTRAVENOUS | Status: AC | PRN
  Administered 2015-04-03: 80 mL via INTRAVENOUS

## 2015-04-17 ENCOUNTER — Ambulatory Visit (INDEPENDENT_AMBULATORY_CARE_PROVIDER_SITE_OTHER): Payer: BLUE CROSS/BLUE SHIELD | Admitting: Cardiovascular Disease

## 2015-04-17 ENCOUNTER — Encounter: Payer: Self-pay | Admitting: Cardiovascular Disease

## 2015-04-17 VITALS — BP 142/80 | HR 85 | Ht 65.0 in | Wt 149.0 lb

## 2015-04-17 DIAGNOSIS — Z87898 Personal history of other specified conditions: Secondary | ICD-10-CM | POA: Diagnosis not present

## 2015-04-17 DIAGNOSIS — I10 Essential (primary) hypertension: Secondary | ICD-10-CM | POA: Diagnosis not present

## 2015-04-17 NOTE — Assessment & Plan Note (Signed)
History of hypertension blood pressure measured at 142/80. She is on amlodipine and losartan. Continued current meds at current dosing

## 2015-04-17 NOTE — Assessment & Plan Note (Signed)
History of atypical chest pain with a positive stress echo. This lead to a coronary CT angiogram performed 04/03/15 which was entirely normal with a calcium score of 0.

## 2015-04-17 NOTE — Patient Instructions (Signed)
Medication Instructions:  Your physician recommends that you continue on your current medications as directed. Please refer to the Current Medication list given to you today.   Labwork: none  Testing/Procedures: none  Follow-Up: Follow up with Dr. Berry as needed.   Any Other Special Instructions Will Be Listed Below (If Applicable).     If you need a refill on your cardiac medications before your next appointment, please call your pharmacy.   

## 2015-04-17 NOTE — Progress Notes (Signed)
     04/17/2015 [name]   December 27, 1985  [id number]  Primary Physician Helane Rima, MD Primary Cardiologist: Lorretta Harp MD Renae Gloss   HPI:  [name] is a 29 year old African American female who works at [employer]. I last saw her 01/30/13 for atypical chest pain. She is the mother of 5 children. Her other problems include treated hypertension and hyperlipidemia. She had a abnormal stress echocardiogram 03/27/15 which led to a coronary CT angiogram 04/03/15 which is completely normal with a calcium score of 0.   Current Outpatient Prescriptions  Medication Sig Dispense Refill  . amLODipine (NORVASC) 5 MG tablet TAKE 1 TABLET EVERY DAY    . losartan (COZAAR) 50 MG tablet TAKE 1 TABLET (50 MG TOTAL) BY MOUTH TWICE A DAY    . sertraline (ZOLOFT) 50 MG tablet Take 50 mg by mouth daily.  11   No current facility-administered medications for this visit.    Allergies  Allergen Reactions  . Labetalol Other (See Comments)    Alopecia   . Lisinopril Cough    cough    Social History   Social History  . Marital Status: Married    Spouse Name: N/A  . Number of Children: 4  . Years of Education: 15   Occupational History  . STUDENT    Social History Main Topics  . Smoking status: Never Smoker   . Smokeless tobacco: Never Used  . Alcohol Use: No  . Drug Use: No  . Sexual Activity:    Partners: Male    Birth Control/ Protection: Surgical     Comment: tubal   Other Topics Concern  . Not on file   Social History Narrative     Review of Systems: General: negative for chills, fever, night sweats or weight changes.  Cardiovascular: negative for chest pain, dyspnea on exertion, edema, orthopnea, palpitations, paroxysmal nocturnal dyspnea or shortness of breath Dermatological: negative for rash Respiratory: negative for cough or wheezing Urologic: negative for hematuria Abdominal: negative for nausea, vomiting, diarrhea, bright red blood per rectum,  melena, or hematemesis Neurologic: negative for visual changes, syncope, or dizziness All other systems reviewed and are otherwise negative except as noted above.    Blood pressure 142/80, pulse 85, height 5\' 5"  (1.651 m), weight 149 lb (67.586 kg), last menstrual period 03/30/2015.  General appearance: alert and no distress Neck: no adenopathy, no carotid bruit, no JVD, supple, symmetrical, trachea midline and thyroid not enlarged, symmetric, no tenderness/mass/nodules Lungs: clear to auscultation bilaterally Heart: regular rate and rhythm, S1, S2 normal, no murmur, click, rub or gallop Extremities: extremities normal, atraumatic, no cyanosis or edema  EKG not performed today  ASSESSMENT AND PLAN:   Hypertension History of hypertension blood pressure measured at 142/80. She is on amlodipine and losartan. Continued current meds at current dosing  H/O chest pain History of atypical chest pain with a positive stress echo. This lead to a coronary CT angiogram performed 04/03/15 which was entirely normal with a calcium score of 0.      Lorretta Harp MD FACP,FACC,FAHA, [facility] 04/17/2015 4:38 PM

## 2015-08-01 ENCOUNTER — Ambulatory Visit: Payer: BLUE CROSS/BLUE SHIELD | Attending: Family Medicine | Admitting: *Deleted

## 2015-08-01 DIAGNOSIS — G5603 Carpal tunnel syndrome, bilateral upper limbs: Secondary | ICD-10-CM | POA: Insufficient documentation

## 2015-08-01 DIAGNOSIS — M79622 Pain in left upper arm: Secondary | ICD-10-CM | POA: Insufficient documentation

## 2015-08-01 DIAGNOSIS — M79621 Pain in right upper arm: Secondary | ICD-10-CM | POA: Diagnosis present

## 2015-08-01 NOTE — Therapy (Signed)
[facility] [address] [place], [place], [address] Phone: [phone]   Fax:  [phone]  Occupational Therapy Evaluation  Patient Details  Name: [name] MRN: [id number] Date of Birth: 05-Oct-1985 Referring Provider: Helane Rima  Encounter Date: 08/01/2015      OT End of Session - 08/01/15 1403    Visit Number 1   Number of Visits 8   Date for OT Re-Evaluation 09/26/15   Authorization Type BC[employer]   OT Start Time 1320   OT Stop Time 1400   OT Time Calculation (min) 40 min   Equipment Utilized During Treatment D-ring pre-fabricated splint   Activity Tolerance Patient tolerated treatment well   Behavior During Therapy [facility] for tasks assessed/performed      Past Medical History  Diagnosis Date  . Hypertension   . Low BMI   . Postpartum hypertension   . Spotting in first trimester     With SAB  . Herpes     cold sores  . GBS carrier     First pregnancy  . H/O varicella   . Abnormal Pap smear 08/2003    colpo  . Pelvic pain     With pregnancy  . Diastasis recti 12/12/08  . Stress     work and at home - no meds  . H/O chest pain 06/17/11, 06/2013    Resolved 06/2013  . Right ovarian cyst 2003    RT OOPHRECTOMY  . Preeclampsia 2010,2011    POST PARTUM;WAS PUT ON BP MEDS  . Preterm contractions 2007    GIVEN BETAMETHASONE AND PROCARDIA TO STOP CTXS  . Headache(784.0)     migraines;D/T BP  . Carpal tunnel syndrome of right wrist   . Palpitations     Hx only - no problems  . LV dysfunction 12/2012, 06/2013    Hx: EF 45-50%, Resolved by Jan 2015 echo- EF 50-55%  . HTN (hypertension)   . SVD (spontaneous vaginal delivery)     x 5  . Panic attack     HAS BEEN RX'D XANAX  . History of kidney stones     passed stone - no surgery required  . GERD     zantac  . Anemia     History -FeSO4 SUPP IN PAST  . History of stress test     a. ETT-Echo 10/16: normal ECGs, + LAD area ischemia >> will arrange  Cardiac CTA  . History of echocardiogram     a. Echo 10/16: EF 50%, diff HK, mild TR, mild PI  . History of CT scan of chest     a. Cardiac CTA 10/16: no CAD  . Hyperlipidemia     Past Surgical History  Procedure Laterality Date  . Ovary removed      right - laparotomy  . Dilation and curettage of uterus  2009  . Breast cyst removal  2005    right  . Tubal ligation  07/03/2012    Procedure: POST PARTUM TUBAL LIGATION;  Surgeon: Delice Lesch, MD;  Location: [facility];  Service: Gynecology;  Laterality: Bilateral;  Bilateral post partum tubal ligation  . Wisdom tooth extraction    . Laparoscopy N/A 10/11/2013    Procedure: LAPAROSCOPY OPERATIVE WITH REMOVAL OF HYDROSALPINX ;  Surgeon: Betsy Coder, MD;  Location: [facility];  Service: Gynecology;  Laterality: N/A;  . Dilitation & currettage/hystroscopy with thermachoice ablation N/A 10/11/2013    Procedure: DILATATION & CURETTAGE/HYSTEROSCOPY WITH THERMACHOICE ABLATION;  Surgeon:  Betsy Coder, MD;  Location: [facility];  Service: Gynecology;  Laterality: N/A;    There were no vitals filed for this visit.  Visit Diagnosis:  Carpal tunnel syndrome, bilateral  Pain of right upper arm  Pain of left upper arm      Subjective Assessment - 08/01/15 1324    Subjective  I'm having most pain in my right hand, especially my ring finger and thumb. My right ring finger and pinky is numb too. No pain in my left hand.    Pertinent History see epic   Patient Stated Goals I want my hands to stop hurting and I don't want to wake up with my fingers numb in the middle of the night    Currently in Pain? Yes   Pain Score 4    Pain Location --  ring finger, pinky, and thumb   Pain Orientation Anterior;Posterior   Pain Descriptors / Indicators Numbness;Dull;Aching   Pain Radiating Towards towards forarm    Pain Onset More than a month ago  2-3 months ago   Pain Frequency Intermittent  every night   Aggravating Factors  repetitive movements,  numbness comes when sleeping   Pain Relieving Factors rest   Multiple Pain Sites No           [facility] OT Assessment - 08/01/15 0001    Assessment   Diagnosis Carpal Tunnel - bilaterally    Referring Provider Helane Rima   Onset Date --  about 9 years ago when pregnant   Prior Therapy none   Precautions   Precautions None   Restrictions   Weight Bearing Restrictions No   Balance Screen   Has the patient fallen in the past 6 months No   Home  Environment   Family/patient expects to be discharged to: Group home   Prior Function   Level of Independence Independent   ADL   Eating/Feeding Independent  sometimes painful and "freezes up"   Grooming Independent  sometimes painful and "freezes up"   Lower Body Bathing Independent   Upper Body Dressing Independent   Lower Body Dressing Independent   Toilet Tranfer Independent   Toileting - [place] Transfer Independent   IADL   Shopping Takes care of all shopping needs independently   Light Housekeeping Does personal laundry completely   Meal Prep Plans, prepares and serves adequate meals independently   Programmer, applications own Licensed conveyancer Status Independent   Written Expression   Dominant Hand Right   Handwriting 100% legible   Vision - History   Baseline Vision Wears contact   Activity Tolerance   Activity Tolerance Endurance does not limit participation in activity   Cognition   Overall Cognitive Status Within Functional Limits for tasks assessed   Observation/Other Assessments   Observations WFL   Sensation   Light Touch Appears Intact   Coordination   Gross Motor Movements are Fluid and Coordinated Yes   Fine Motor Movements are Fluid and Coordinated Yes   Hand Function   Right Hand Gross Grasp Functional   Left Hand Gross Grasp Functional         Treatment:  Administered Conservative Therapy Program for  Carpal Tunnel Syndrome. Went over handout extensively and reiterated importance of modifying activities during the day. Administered D-Ring Splint to R wrist and encouraged patient to wear this while at work and while sleeping. Encouraged patient to complete  exercises 1 and 2 on back of sheet 3-4 times per day, taking splint off for exercise #1.                 OT Education - 08/01/15 1403    Education provided Yes   Education Details Conservative Therapy Program for Carpal Tunnel Syndrome    Person(s) Educated Patient   Methods Explanation;Demonstration;Handout   Comprehension Verbalized understanding;Returned demonstration;Need further instruction          OT Short Term Goals - 08/01/15 1409    OT SHORT TERM GOAL #1   Title Pt will be independent with initial HEP   Time 4   Period Weeks   Status New   OT SHORT TERM GOAL #2   Title Pt will be independent with donning doffing of pre-fab splint and will be independent with splint wearing schedule   Time 4   Period Weeks   Status New   OT SHORT TERM GOAL #3   Title Patient's pain will decrease to less than 4/10 during activities/tasks in order to 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    Time 4   Period Weeks   Status New   OT SHORT TERM GOAL #4   Title Therapist will discuss need for splinting of L wrist as needed and as appropriate for patient   Time 4   Period Weeks   Status New           OT Long Term Goals - 08/01/15 1411    OT LONG TERM GOAL #1   Title Pt will be independent with modified HEP   Time 8   Period Weeks   Status New   OT LONG TERM GOAL #2   Title Patient's pain will decrease to less than 2/10 during activities/tasks in order to 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 When pt pregnant with her 32 year old she started experiencing increased pain in right hand. Pt reports this pain would come and go during her pregnancies. Pt reports since her last pregnancy the pain has not went away. Pt reports pain is worse in right hand. Pt will benefit in skilled occupational therapy for pain management so she can complete ADLs, IADLs, and work duties.    Pt will benefit from skilled therapeutic intervention in order to improve on the following deficits (Retired) Pain;Impaired UE functional use;Impaired sensation   Rehab Potential Good   Clinical Impairments Affecting Rehab Potential none known at this time    OT Frequency 1x / week   OT Duration 8 weeks   OT Treatment/Interventions Moist Heat;Traction;Ultrasound;Iontophoresis;Parrafin;Fluidtherapy;Contrast Bath;Therapeutic exercise;Splinting;Therapeutic exercises;Therapeutic activities;Patient/family education   Plan Make sure pre-fab splint is fitting well, check that patient is ok with wearing schedule, add to HEP   OT Home Exercise Plan Conservative Therapy Program for Carpal Tunnel Syndrome - 08/01/15   Recommended Other Services none at this time   Consulted and Agree with Plan of Care Patient  Problem List Patient Active Problem List   Diagnosis Date Noted  . Fever 10/19/2013  . Fever of unknown origin 10/18/2013  . Anxiety 06/28/2013  . Hypokalemia 04/28/2013  . Chest pain at rest 01/06/2013  . LV dysfunction, by Echo EF 45-50%- resolved by Echo Jan 2015 01/06/2013  . Tachycardia 01/06/2013  . Musculoskeletal leg pain 03/31/2012  . Herpes   . H/O varicella   . Pelvic pain   . Panic attack   . Right ovarian cyst   . Anemia   . GERD   . Hypertension 11/13/2011  . H/O chest pain 06/17/2011  . Diastasis recti 12/12/2008  . Abnormal  Pap smear 08/14/2003    Chrys Racer , MS, OTR/L, CLT Pager: [phone]  08/01/2015, 2:21 PM  [facility] [address] [place], [place], [address] Phone: [phone]   Fax:  [phone]  Name: [name] MRN: [id number] Date of Birth: 12-14-1985

## 2015-08-07 ENCOUNTER — Encounter: Payer: BLUE CROSS/BLUE SHIELD | Admitting: Occupational Therapy

## 2015-08-12 ENCOUNTER — Encounter: Payer: BLUE CROSS/BLUE SHIELD | Admitting: Occupational Therapy

## 2015-08-20 ENCOUNTER — Encounter: Payer: BLUE CROSS/BLUE SHIELD | Admitting: Occupational Therapy

## 2015-08-27 ENCOUNTER — Ambulatory Visit: Payer: BLUE CROSS/BLUE SHIELD | Attending: Family Medicine | Admitting: Occupational Therapy

## 2015-09-03 ENCOUNTER — Ambulatory Visit: Payer: BLUE CROSS/BLUE SHIELD | Admitting: Occupational Therapy

## 2015-09-04 ENCOUNTER — Ambulatory Visit
Admission: RE | Admit: 2015-09-04 | Discharge: 2015-09-04 | Disposition: A | Discharge status: Home / Self Care | Payer: BLUE CROSS/BLUE SHIELD | Source: Ambulatory Visit | Attending: Family Medicine | Admitting: Family Medicine

## 2015-09-04 ENCOUNTER — Other Ambulatory Visit: Payer: Self-pay | Admitting: Family Medicine

## 2015-09-04 DIAGNOSIS — M79672 Pain in left foot: Secondary | ICD-10-CM

## 2015-09-09 ENCOUNTER — Encounter: Payer: BLUE CROSS/BLUE SHIELD | Admitting: Occupational Therapy

## 2015-09-10 ENCOUNTER — Ambulatory Visit: Payer: BLUE CROSS/BLUE SHIELD | Admitting: Occupational Therapy

## 2015-09-19 ENCOUNTER — Ambulatory Visit: Payer: BLUE CROSS/BLUE SHIELD | Attending: Family Medicine | Admitting: Occupational Therapy

## 2015-09-26 ENCOUNTER — Ambulatory Visit: Payer: BLUE CROSS/BLUE SHIELD | Admitting: Occupational Therapy

## 2015-10-03 ENCOUNTER — Encounter: Payer: BLUE CROSS/BLUE SHIELD | Admitting: Occupational Therapy

## 2015-10-10 ENCOUNTER — Encounter: Payer: BLUE CROSS/BLUE SHIELD | Admitting: Occupational Therapy

## 2015-10-17 ENCOUNTER — Encounter: Payer: BLUE CROSS/BLUE SHIELD | Admitting: Occupational Therapy

## 2015-10-24 ENCOUNTER — Encounter: Payer: BLUE CROSS/BLUE SHIELD | Admitting: Occupational Therapy

## 2016-05-01 ENCOUNTER — Encounter: Payer: Self-pay | Admitting: Occupational Therapy

## 2016-05-01 NOTE — Therapy (Signed)
[facility] [address] [place], [place], [address] Phone: [phone]   Fax:  [phone]  Patient Details  Name: [name] MRN: [id number] Date of Birth: 10/28/1985 Referring Provider:  No ref. provider found  Encounter Date: 05/01/2016 - Documentation encounter  OCCUPATIONAL THERAPY DISCHARGE SUMMARY  Visits from Start of Care: 1- eval only  Current functional level related to goals / functional outcomes: Cannot comment on goals as patient did not attend any subsequent scheduled appointments.    Remaining deficits: Cannot comment  Education / Equipment: See evaluation Plan:                                                    Patient goals were not met. Patient is being discharged due to not returning since the last visit.  ?????      Gellert, Kristin M, OTR/L 05/01/2016, 11:24 AM  [facility] [address] [place], [place], [address] Phone: [phone]   Fax:  [phone] 

## 2016-05-01 NOTE — Addendum Note (Signed)
Addended by: Mariah Milling on: 05/01/2016 11:22 AM   Modules accepted: Orders

## 2016-06-29 IMAGING — CR DG FOOT COMPLETE 3+V*L*
3 series · 3 of 3 positions shown · non-contrast
Comparison: None.

CLINICAL DATA: Left foot pain for several weeks, no known injury,
initial encounter

EXAM:
LEFT FOOT - COMPLETE 3+ VIEW

[t foot ap left]
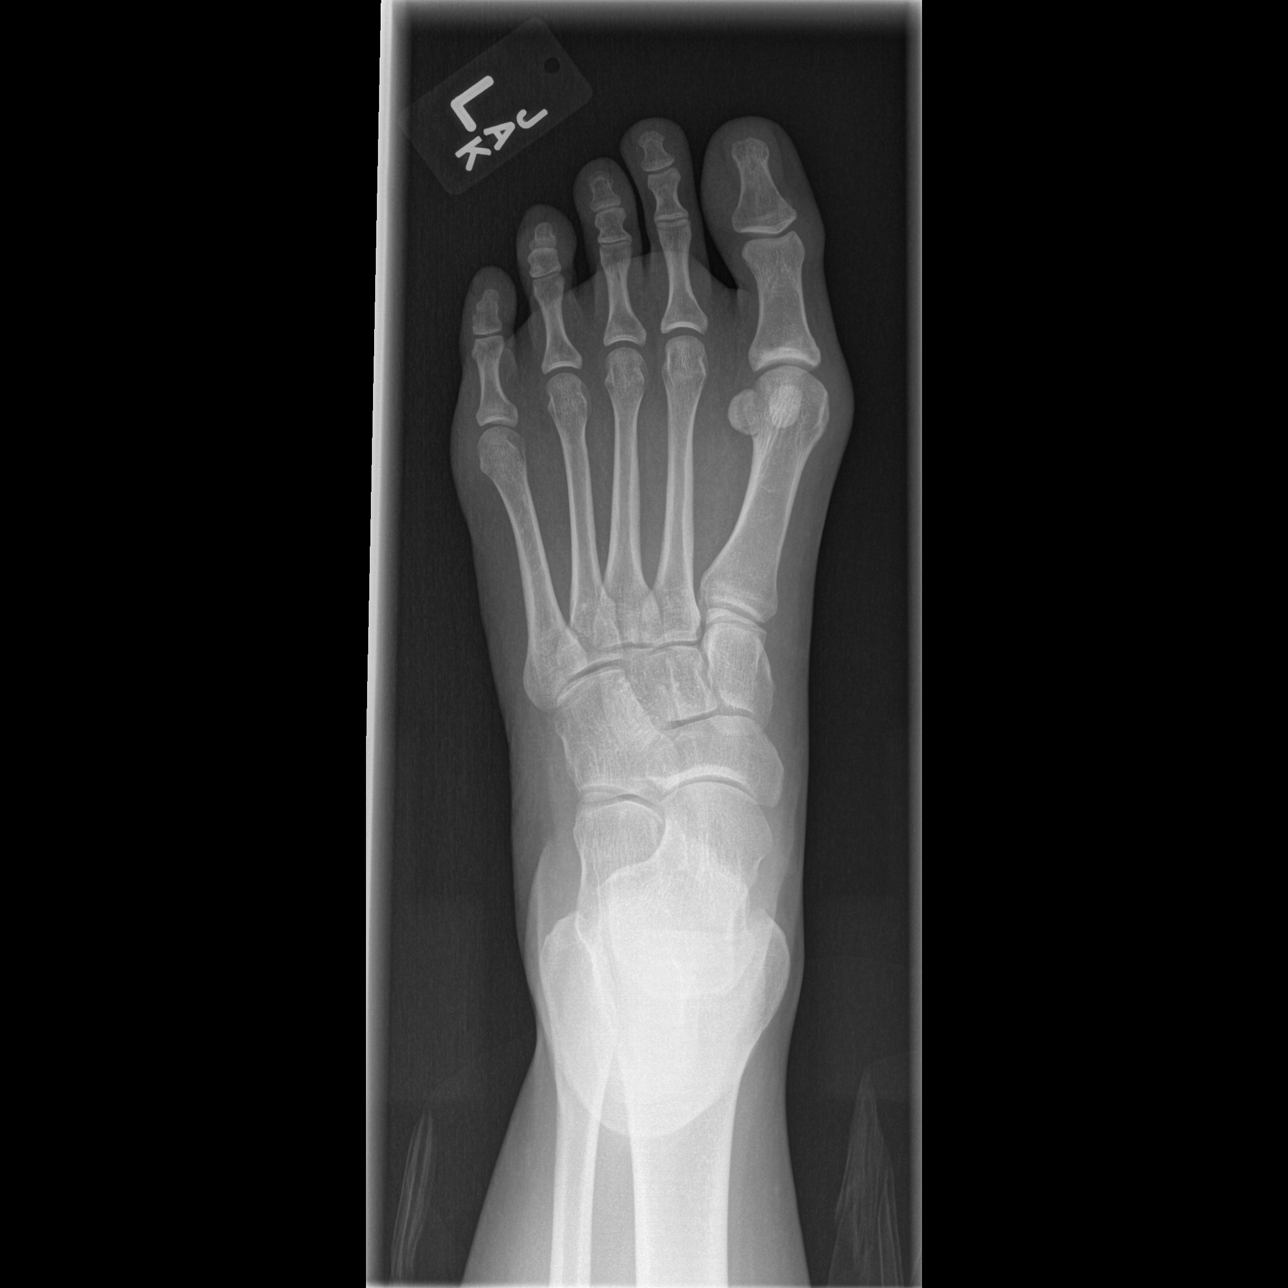

[t foot oblique left]
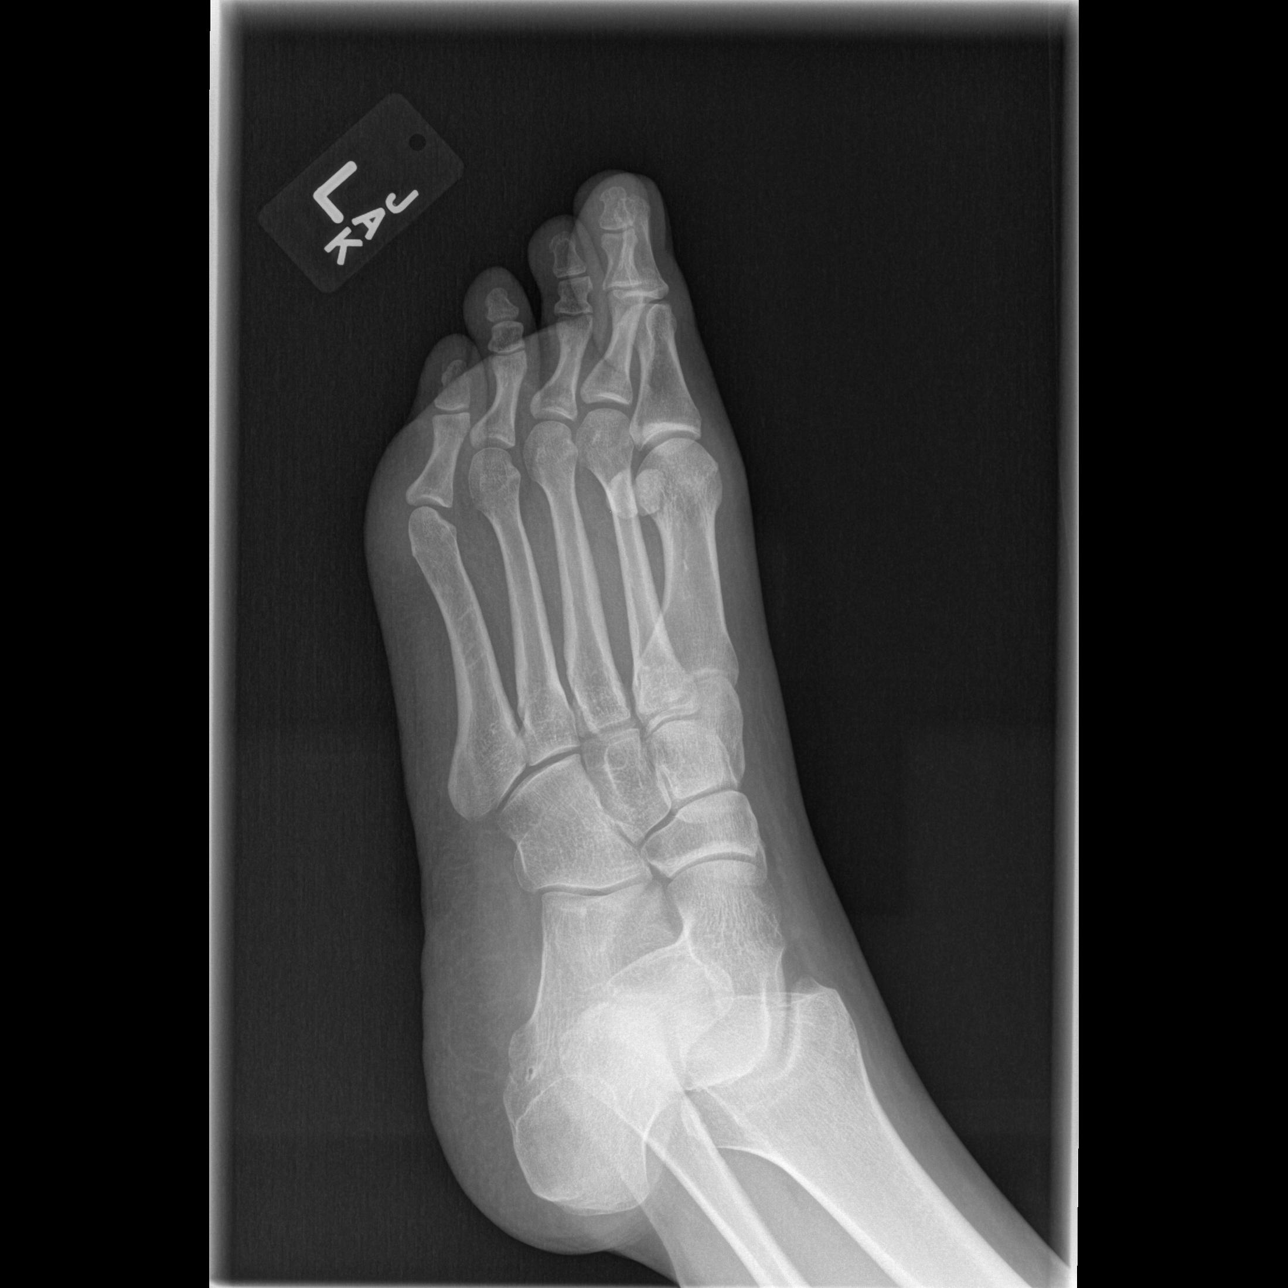

[t foot lat left]
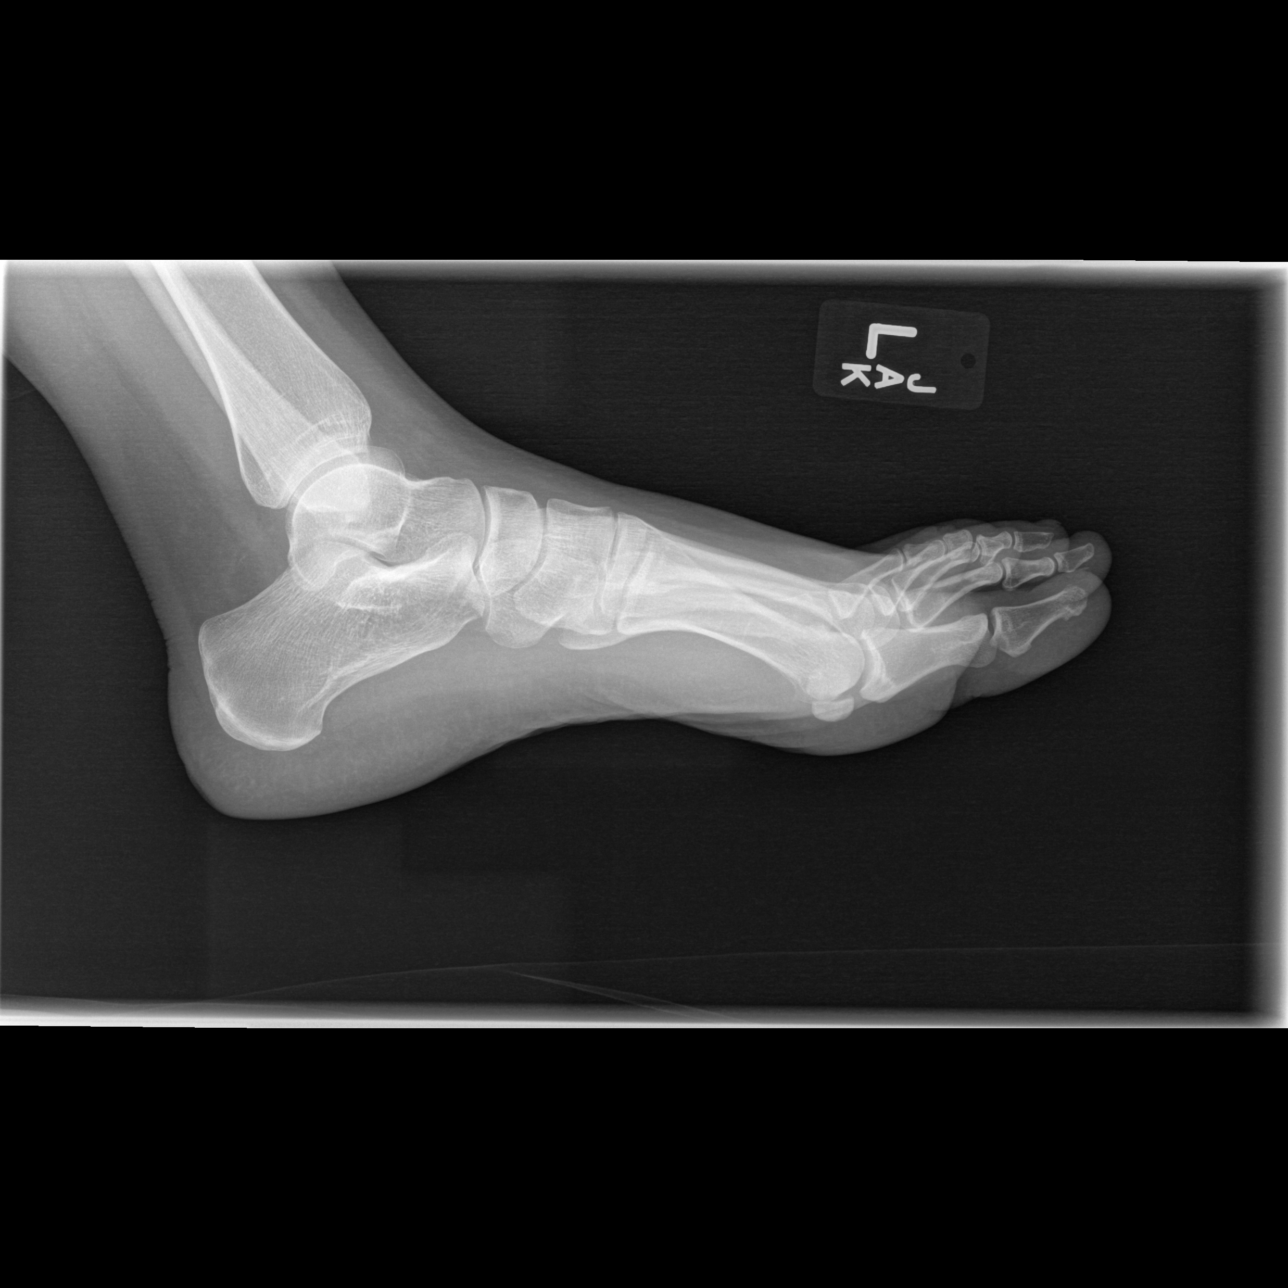

[3 of 3 positions shown; findings below may reference images not displayed]

FINDINGS: Mild hallux valgus deformity is noted. No acute fracture or
dislocation is noted. No gross soft tissue abnormality is seen.
IMPRESSION: No acute abnormality noted.

## 2016-07-28 ENCOUNTER — Emergency Department (HOSPITAL_COMMUNITY): Payer: BLUE CROSS/BLUE SHIELD

## 2016-07-28 ENCOUNTER — Emergency Department (HOSPITAL_COMMUNITY)
Admission: EM | Admit: 2016-07-28 | Discharge: 2016-07-28 | Disposition: A | Discharge status: Home / Self Care | Payer: BLUE CROSS/BLUE SHIELD | Attending: Emergency Medicine | Admitting: Emergency Medicine

## 2016-07-28 ENCOUNTER — Encounter (HOSPITAL_COMMUNITY): Payer: Self-pay

## 2016-07-28 DIAGNOSIS — I10 Essential (primary) hypertension: Secondary | ICD-10-CM | POA: Insufficient documentation

## 2016-07-28 DIAGNOSIS — R079 Chest pain, unspecified: Secondary | ICD-10-CM | POA: Insufficient documentation

## 2016-07-28 LAB — CBC
HCT: 36.4 % (ref 36.0–46.0)
Hemoglobin: 11.6 g/dL — ABNORMAL LOW (ref 12.0–15.0)
MCH: 25.6 pg — AB (ref 26.0–34.0)
MCHC: 31.9 g/dL (ref 30.0–36.0)
MCV: 80.4 fL (ref 78.0–100.0)
Platelets: 217 10*3/uL (ref 150–400)
RBC: 4.53 MIL/uL (ref 3.87–5.11)
RDW: 13.6 % (ref 11.5–15.5)
WBC: 6.9 10*3/uL (ref 4.0–10.5)

## 2016-07-28 LAB — HEPATIC FUNCTION PANEL
ALT: 9 U/L — AB (ref 14–54)
AST: 15 U/L (ref 15–41)
Albumin: 4 g/dL (ref 3.5–5.0)
Alkaline Phosphatase: 52 U/L (ref 38–126)
Total Bilirubin: 0.2 mg/dL — ABNORMAL LOW (ref 0.3–1.2)
Total Protein: 6.9 g/dL (ref 6.5–8.1)

## 2016-07-28 LAB — BASIC METABOLIC PANEL
Anion gap: 5 (ref 5–15)
BUN: 15 mg/dL (ref 6–20)
CO2: 27 mmol/L (ref 22–32)
CREATININE: 0.89 mg/dL (ref 0.44–1.00)
Calcium: 8.8 mg/dL — ABNORMAL LOW (ref 8.9–10.3)
Chloride: 107 mmol/L (ref 101–111)
GFR calc non Af Amer: >60 mL/min (ref >60)
Glucose, Bld: 92 mg/dL (ref 65–99)
Potassium: 4.3 mmol/L (ref 3.5–5.1)
SODIUM: 139 mmol/L (ref 135–145)

## 2016-07-28 LAB — I-STAT BETA HCG BLOOD, ED (MC, WL, AP ONLY): I-stat hCG, quantitative: <5 m[IU]/mL (ref <5)

## 2016-07-28 LAB — I-STAT TROPONIN, ED: Troponin i, poc: 0 ng/mL (ref 0.00–0.08)

## 2016-07-28 LAB — LIPASE, BLOOD: Lipase: 23 U/L (ref 11–51)

## 2016-07-28 LAB — D-DIMER, QUANTITATIVE: D-Dimer, Quant: 0.5 ug/mL-FEU (ref 0.00–0.50)

## 2016-07-28 NOTE — ED Provider Notes (Signed)
[facility] DEPT Provider Note   CSN: [id number] Arrival date & time: 07/28/16  0840     History   Chief Complaint Chief Complaint  Patient presents with  . Chest Pain    HPI [name] is a 31 y.o. female.  The history is provided by the patient and medical records.  Chest Pain      31 year old female with history of anemia, GERD, migraine headaches, hypertension, hyperlipidemia, presenting to the ED for chest pain. Patient reports she woke up this morning was having some sharp, right-sided chest pain with radiation to her back. Pain is worse with taking a deep breath. Nothing seems to make it better. She denies any burning sensation in her chest, states this feels different than her GERD. She denies any frank shortness of breath, diaphoresis, nausea, or vomiting.  Patient reports history of chest pain in the past, but has been evaluated by cardiology, Dr. Gwenlyn Found. She is currently on 2 different blood pressure medications, no issues with this. States she has had a stress test in the past which was somewhat abnormal, but had a CT scan of her coronaries which was normal. She does have family history of early onset cardiac disease as her mother died at age 2 due to complications with congestive heart failure and her father had an MI on New Year's Eve 2017 in his 41s.  Patient is not a smoker.  No hx of DVT or PE.  Reports sister has hx of clots.  Not currently on exogenous estrogens.  No recent travel or prolonged immobilization.  Patient's only other complaint is some mild lower abdominal cramping. States she started her cycle a few days ago. Reports usually she does not have cramps that she had a uterine ablation for endometriosis. No nausea, vomiting, or diarrhea. No vaginal discharge or pelvic pain.  Past Medical History:  Diagnosis Date  . Abnormal Pap smear 08/2003   colpo  . Anemia    History -FeSO4 SUPP IN PAST  . Carpal tunnel syndrome of right wrist   . Diastasis recti  12/12/08  . 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 GERD    zantac  . H/O chest pain 06/17/11, 06/2013   Resolved 06/2013  . H/O varicella   . Headache(784.0)    migraines;D/T BP  . Herpes    cold sores  . History of CT scan of chest    a. Cardiac CTA 10/16: no CAD  . History of echocardiogram    a. Echo 10/16: EF 50%, diff HK, mild TR, mild PI  . History of kidney stones    passed stone - no surgery required  . History of stress test    a. ETT-Echo 10/16: normal ECGs, + LAD area ischemia >> will arrange Cardiac CTA  . HTN (hypertension)   . Hyperlipidemia   . Hypertension   . Low BMI   . LV dysfunction 12/2012, 06/2013   Hx: EF 45-50%, Resolved by Jan 2015 echo- EF 50-55%  . Palpitations    Hx only - no problems  . Panic attack    HAS BEEN RX'D XANAX  . Pelvic pain    With pregnancy  . Postpartum hypertension   . Preeclampsia 2010,2011   POST PARTUM;WAS PUT ON BP MEDS  . Preterm contractions 2007   GIVEN BETAMETHASONE AND PROCARDIA TO STOP CTXS  . Right ovarian cyst 2003   RT OOPHRECTOMY  . Spotting in first trimester    With SAB  .  Stress    work and at home - no meds  . SVD (spontaneous vaginal delivery)    x 5    Patient Active Problem List   Diagnosis Date Noted  . Fever 10/19/2013  . Fever of unknown origin 10/18/2013  . Anxiety 06/28/2013  . Hypokalemia 04/28/2013  . Chest pain at rest 01/06/2013  . LV dysfunction, by Echo EF 45-50%- resolved by Echo Jan 2015 01/06/2013  . Tachycardia 01/06/2013  . Musculoskeletal leg pain 03/31/2012  . Herpes   . H/O varicella   . Pelvic pain   . Panic attack   . Right ovarian cyst   . Anemia   . GERD   . Hypertension 11/13/2011  . H/O chest pain 06/17/2011  . Diastasis recti 12/12/2008  . Abnormal Pap smear 08/14/2003    Past Surgical History:  Procedure Laterality Date  . breast cyst removal  2005   right  . DILATION AND CURETTAGE OF UTERUS  2009  . DILITATION & CURRETTAGE/HYSTROSCOPY WITH THERMACHOICE ABLATION  N/A 10/11/2013   Procedure: DILATATION & CURETTAGE/HYSTEROSCOPY WITH THERMACHOICE ABLATION;  Surgeon: Betsy Coder, MD;  Location: [facility];  Service: Gynecology;  Laterality: N/A;  . LAPAROSCOPY N/A 10/11/2013   Procedure: LAPAROSCOPY OPERATIVE WITH REMOVAL OF HYDROSALPINX ;  Surgeon: Betsy Coder, MD;  Location: [facility];  Service: Gynecology;  Laterality: N/A;  . ovary removed     right - laparotomy  . TUBAL LIGATION  07/03/2012   Procedure: POST PARTUM TUBAL LIGATION;  Surgeon: Delice Lesch, MD;  Location: [facility];  Service: Gynecology;  Laterality: Bilateral;  Bilateral post partum tubal ligation  . WISDOM TOOTH EXTRACTION      OB History    Gravida Para Term Preterm AB Living   6 5 5   1 5    SAB TAB Ectopic Multiple Live Births   1       5       Home Medications    Prior to Admission medications   Medication Sig Start Date End Date Taking? Authorizing Provider  amLODipine (NORVASC) 5 MG tablet TAKE 1 TABLET EVERY DAY 03/06/15  Yes Historical Provider, MD  losartan (COZAAR) 50 MG tablet TAKE 1 TABLET (50 MG TOTAL) BY MOUTH TWICE A DAY 04/02/14  Yes Lorretta Harp, MD  traZODone (DESYREL) 50 MG tablet Take 50 mg by mouth at bedtime.   Yes Historical Provider, MD  venlafaxine XR (EFFEXOR-XR) 150 MG 24 hr capsule Take 150 mg by mouth daily with breakfast.   Yes Historical Provider, MD  Vitamin D, Ergocalciferol, (DRISDOL) 50000 units CAPS capsule Take 50,000 Units by mouth every 7 (seven) days.   Yes Historical Provider, MD    Family History Family History  Problem Relation Age of Onset  . Heart disease Mother     CHF  . Hypertension Mother   . Asthma Mother     CHILDHOOD  . Diabetes Mother   . Kidney disease Mother     dialysis  . Depression Mother   . Alcohol abuse Mother   . Drug abuse Mother   . Heart failure Mother   . Hypertension Father   . Alcohol abuse Father   . Drug abuse Father   . Asthma Sister   . COPD Sister     chronic bronchitis  . Depression  Sister     ATTEMPTED SUICIDE  . Arrhythmia Sister     needs ablation   . Hypertension Maternal Grandmother   . Diabetes Maternal  Grandmother     Social History Social History  Substance Use Topics  . Smoking status: Never Smoker  . Smokeless tobacco: Never Used  . Alcohol use No     Allergies   Hydrochlorothiazide; Labetalol; and Lisinopril   Review of Systems Review of Systems  Cardiovascular: Positive for chest pain.  All other systems reviewed and are negative.    Physical Exam Updated Vital Signs BP (!) 139/101 (BP Location: Left Arm)   Pulse 96   Temp 98.4 F (36.9 C) (Oral)   Resp 18   Ht 5\' 5"  (1.651 m)   Wt 70.3 kg   LMP 07/27/2016 (Approximate)   SpO2 98%   BMI 25.79 kg/m   Physical Exam  Constitutional: She is oriented to person, place, and time. She appears well-developed and well-nourished.  HENT:  Head: Normocephalic and atraumatic.  Mouth/Throat: Oropharynx is clear and moist.  Eyes: Conjunctivae and EOM are normal. Pupils are equal, round, and reactive to light.  Neck: Normal range of motion.  No JVD  Cardiovascular: Normal rate, regular rhythm and normal heart sounds.   Pulmonary/Chest: Effort normal and breath sounds normal.  Abdominal: Soft. Bowel sounds are normal.  Musculoskeletal: Normal range of motion.  No peripheral edema, calf swelling, or tenderness; no overlying skin changes or warmth to touch  Neurological: She is alert and oriented to person, place, and time.  Skin: Skin is warm and dry.  Psychiatric: She has a normal mood and affect.  Appears worried and slightly anxious  Nursing note and vitals reviewed.    ED Treatments / Results  Labs (all labs ordered are listed, but only abnormal results are displayed) Labs Reviewed  BASIC METABOLIC PANEL - Abnormal; Notable for the following:       Result Value   Calcium 8.8 (*)    All other components within normal limits  CBC - Abnormal; Notable for the following:     Hemoglobin 11.6 (*)    MCH 25.6 (*)    All other components within normal limits  HEPATIC FUNCTION PANEL - Abnormal; Notable for the following:    ALT 9 (*)    Total Bilirubin 0.2 (*)    Bilirubin, Direct <0.1 (*)    All other components within normal limits  D-DIMER, QUANTITATIVE (NOT AT [facility])  LIPASE, BLOOD  I-STAT TROPOININ, ED  I-STAT BETA HCG BLOOD, ED ([facility], [facility], [facility] ONLY)    EKG  EKG Interpretation  Date/Time:  Tuesday July 28 2016 08:51:43 EST Ventricular Rate:  84 PR Interval:    QRS Duration: 89 QT Interval:  363 QTC Calculation: 430 R Axis:   68 Text Interpretation:  Sinus rhythm No significant change since last tracing Confirmed by Ashok Cordia  MD, Lennette Bihari ([id number]) on 07/28/2016 10:46:34 AM       Radiology Dg Chest 2 View  Result Date: 07/28/2016 CLINICAL DATA:  Chest pain EXAM: CHEST  2 VIEW COMPARISON:  Chest CT 04/03/2015 FINDINGS: The heart size and mediastinal contours are within normal limits. Both lungs are clear. The visualized skeletal structures are unremarkable. IMPRESSION: No active cardiopulmonary disease. Electronically Signed   By: Rolm Baptise M.D.   On: 07/28/2016 09:07    Procedures Procedures (including critical care time)  Medications Ordered in ED Medications - No data to display   Initial Impression / Assessment and Plan / ED Course  I have reviewed the triage vital signs and the nursing notes.  Pertinent labs & imaging results that were available during my care of the  patient were reviewed by me and considered in my medical decision making (see chart for details).  31 year old female here with chest pain. Began this morning, localized to the right chest with radiation into the back. Worse with deep breathing. She has history of hypertension but no personal cardiac history. EKG is nonischemic. Labwork including troponin and d-dimer are negative. Chest x-ray is clear. Patient's vitals have remained stable here. Patient does have history of  hypertension, hyperlipidemia, and early onset of family cardiac history. She has been evaluated by cardiology in the past, had a negative CT of her coronaries in 2016. Low risk heart score of 2.  Symptoms are somewhat atypical.  Given negative evaluation here, feel she is stable for discharge.  Will have her follow-up with her primary care doctor.  May go back and see her cardiologist as well if needed.  Discussed plan with patient, she acknowledged understanding and agreed with plan of care.  Return precautions given for new or worsening symptoms.  Final Clinical Impressions(s) / ED Diagnoses   Final diagnoses:  Chest pain, unspecified type    New Prescriptions Discharge Medication List as of 07/28/2016 10:49 AM       Larene Pickett, PA-C 07/28/16 [place], MD 07/28/16 1453

## 2016-07-28 NOTE — Discharge Instructions (Signed)
Your evaluation here today did not show any acute findings. Recommend to follow-up with your primary care doctor.  Can also follow-up with Dr. Gwenlyn Found if needed. Return here for new/worsening symptoms.

## 2016-07-28 NOTE — ED Triage Notes (Signed)
Pt presents with c/o chest pain that started this morning. Pt reports the pain radiates to her back. Pt reports her stomach has been bothering her, says she "feels like she needs to have diarrhea" but reports any N/V/[name] reports the pain is in the center of her chest and is sharp.

## 2016-10-01 ENCOUNTER — Ambulatory Visit (HOSPITAL_COMMUNITY)
Admission: EM | Admit: 2016-10-01 | Discharge: 2016-10-01 | Disposition: A | Discharge status: Home / Self Care | Payer: BLUE CROSS/BLUE SHIELD | Attending: Family Medicine | Admitting: Family Medicine

## 2016-10-01 ENCOUNTER — Encounter (HOSPITAL_COMMUNITY): Payer: Self-pay | Admitting: Emergency Medicine

## 2016-10-01 DIAGNOSIS — N3090 Cystitis, unspecified without hematuria: Secondary | ICD-10-CM | POA: Diagnosis not present

## 2016-10-01 LAB — POCT URINALYSIS DIP (DEVICE)
Bilirubin Urine: NEGATIVE
Glucose, UA: NEGATIVE mg/dL
Ketones, ur: NEGATIVE mg/dL
NITRITE: NEGATIVE
PH: 6.5 (ref 5.0–8.0)
PROTEIN: NEGATIVE mg/dL
Specific Gravity, Urine: 1.025 (ref 1.005–1.030)
UROBILINOGEN UA: 0.2 mg/dL (ref 0.0–1.0)

## 2016-10-01 MED ORDER — CIPROFLOXACIN HCL 500 MG PO TABS
500.0000 mg | ORAL_TABLET | Freq: Once | ORAL | Status: AC
  Administered 2016-10-01: 500 mg via ORAL

## 2016-10-01 MED ORDER — CIPROFLOXACIN HCL 500 MG PO TABS: 500.0000 mg | ORAL_TABLET | Freq: Two times a day (BID) | ORAL | 0 refills | Status: DC

## 2016-10-01 MED ORDER — CIPROFLOXACIN HCL 500 MG PO TABS
ORAL_TABLET | ORAL | Status: AC
  Filled 2016-10-01: qty 1

## 2016-10-01 NOTE — Discharge Instructions (Signed)
Remember to do your plank exercise to help your abdomen flattened. He can also do leg lifts before going to work every day.  You need to eliminate urine (go to the bathroom) at 11:30 and 2:30 every day.

## 2016-10-01 NOTE — ED Triage Notes (Signed)
PT reports intermittent right flank pain with very slight incontinence. PT reports she was at an old job and she had to hold her urine all day. PT reports urinating is slightly uncomfortable.

## 2016-10-01 NOTE — ED Triage Notes (Signed)
This has been worsening for months

## 2016-10-01 NOTE — ED Provider Notes (Signed)
[name]    CSN: [id number] Arrival date & time: 10/01/16  [name]     History   Chief Complaint Chief Complaint  Patient presents with  . Urinary Tract Infection    HPI [name] is a 31 y.o. female.   PT reports intermittent right flank pain with very slight incontinence. PT reports she was at an old job and she had to hold her urine all day. PT reports urinating is slightly uncomfortable.   Patient works a Designer, industrial/product. 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  Patient's been having some right flank pain. Her, nausea, or vomiting.      Past Medical History:  Diagnosis Date  . Abnormal Pap smear 08/2003   colpo  . Anemia    History -FeSO4 SUPP IN PAST  . Carpal tunnel syndrome of right wrist   . Diastasis recti 12/12/08  . 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 GERD    zantac  . H/O chest pain 06/17/11, 06/2013   Resolved 06/2013  . H/O varicella   . Headache(784.0)    migraines;D/T BP  . Herpes    cold sores  . History of CT scan of chest    a. Cardiac CTA 10/16: no CAD  . History of echocardiogram    a. Echo 10/16: EF 50%, diff HK, mild TR, mild PI  . History of kidney stones    passed stone - no surgery required  . History of stress test    a. ETT-Echo 10/16: normal ECGs, + LAD area ischemia >> will arrange Cardiac CTA  . HTN (hypertension)   . Hyperlipidemia   . Hypertension   . Low BMI   . LV dysfunction 12/2012, 06/2013   Hx: EF 45-50%, Resolved by Jan 2015 echo- EF 50-55%  . Palpitations    Hx only - no problems  . Panic attack    HAS BEEN RX'D XANAX  . Pelvic pain    With pregnancy  . Postpartum hypertension   . Preeclampsia 2010,2011   POST PARTUM;WAS PUT ON BP MEDS  . Preterm contractions 2007   GIVEN BETAMETHASONE AND PROCARDIA TO STOP CTXS  . Right ovarian cyst 2003   RT OOPHRECTOMY  . Spotting in first trimester    With SAB  . Stress    work and at home - no meds  . SVD (spontaneous vaginal delivery)    x  5    Patient Active Problem List   Diagnosis Date Noted  . Fever 10/19/2013  . Fever of unknown origin 10/18/2013  . Anxiety 06/28/2013  . Hypokalemia 04/28/2013  . Chest pain at rest 01/06/2013  . LV dysfunction, by Echo EF 45-50%- resolved by Echo Jan 2015 01/06/2013  . Tachycardia 01/06/2013  . Musculoskeletal leg pain 03/31/2012  . Herpes   . H/O varicella   . Pelvic pain   . Panic attack   . Right ovarian cyst   . Anemia   . GERD   . Hypertension 11/13/2011  . H/O chest pain 06/17/2011  . Diastasis recti 12/12/2008  . Abnormal Pap smear 08/14/2003    Past Surgical History:  Procedure Laterality Date  . breast cyst removal  2005   right  . DILATION AND CURETTAGE OF UTERUS  2009  . DILITATION & CURRETTAGE/HYSTROSCOPY WITH THERMACHOICE ABLATION N/A 10/11/2013   Procedure: DILATATION & CURETTAGE/HYSTEROSCOPY WITH THERMACHOICE ABLATION;  Surgeon: Betsy Coder, MD;  Location: [facility];  Service: Gynecology;  Laterality:  N/A;  . LAPAROSCOPY N/A 10/11/2013   Procedure: LAPAROSCOPY OPERATIVE WITH REMOVAL OF HYDROSALPINX ;  Surgeon: Betsy Coder, MD;  Location: [facility];  Service: Gynecology;  Laterality: N/A;  . ovary removed     right - laparotomy  . TUBAL LIGATION  07/03/2012   Procedure: POST PARTUM TUBAL LIGATION;  Surgeon: Delice Lesch, MD;  Location: [facility];  Service: Gynecology;  Laterality: Bilateral;  Bilateral post partum tubal ligation  . WISDOM TOOTH EXTRACTION      OB History    Gravida Para Term Preterm AB Living   6 5 5   1 5    SAB TAB Ectopic Multiple Live Births   1       5       Home Medications    Prior to Admission medications   Medication Sig Start Date End Date Taking? Authorizing Provider  amLODipine (NORVASC) 5 MG tablet TAKE 1 TABLET EVERY DAY 03/06/15   Historical Provider, MD  ciprofloxacin (CIPRO) 500 MG tablet Take 1 tablet (500 mg total) by mouth 2 (two) times daily. 10/01/16   [name], MD  losartan (COZAAR) 50 MG tablet TAKE  1 TABLET (50 MG TOTAL) BY MOUTH TWICE A DAY 04/02/14   Lorretta Harp, MD  traZODone (DESYREL) 50 MG tablet Take 50 mg by mouth at bedtime.    Historical Provider, MD  venlafaxine XR (EFFEXOR-XR) 150 MG 24 hr capsule Take 150 mg by mouth daily with breakfast.    Historical Provider, MD  Vitamin D, Ergocalciferol, (DRISDOL) 50000 units CAPS capsule Take 50,000 Units by mouth every 7 (seven) days.    Historical Provider, MD    Family History Family History  Problem Relation Age of Onset  . Heart disease Mother     CHF  . Hypertension Mother   . Asthma Mother     CHILDHOOD  . Diabetes Mother   . Kidney disease Mother     dialysis  . Depression Mother   . Alcohol abuse Mother   . Drug abuse Mother   . Heart failure Mother   . Hypertension Father   . Alcohol abuse Father   . Drug abuse Father   . Asthma Sister   . COPD Sister     chronic bronchitis  . Depression Sister     ATTEMPTED SUICIDE  . Arrhythmia Sister     needs ablation   . Hypertension Maternal Grandmother   . Diabetes Maternal Grandmother     Social History Social History  Substance Use Topics  . Smoking status: Never Smoker  . Smokeless tobacco: Never Used  . Alcohol use Yes     Comment: rarely     Allergies   Hydrochlorothiazide; Labetalol; and Lisinopril   Review of Systems Review of Systems  Constitutional: Negative.   Gastrointestinal: Negative.   Genitourinary: Positive for dysuria, frequency and hematuria.  Neurological: Negative.   All other systems reviewed and are negative.    Physical Exam Triage Vital Signs ED Triage Vitals [10/01/16 1903]  Enc Vitals Group     BP 138/77     Pulse Rate 76     Resp 16     Temp 98.2 F (36.8 C)     Temp Source Oral     SpO2 100 %     Weight 165 lb (74.8 kg)     Height 5\' 5"  (1.651 m)     Head Circumference      Peak Flow  Pain Score 4     Pain Loc      Pain Edu?      Excl. in [place]?    No data found.   Updated Vital Signs BP 138/77    Pulse 76   Temp 98.2 F (36.8 C) (Oral)   Resp 16   Ht 5\' 5"  (1.651 m)   Wt 165 lb (74.8 kg)   SpO2 100%   BMI 27.46 kg/m    Physical Exam  Constitutional: She is oriented to person, place, and time. She appears well-developed and well-nourished.  HENT:  Right Ear: External ear normal.  Left Ear: External ear normal.  Mouth/Throat: Oropharynx is clear and moist.  Eyes: Conjunctivae and EOM are normal. Pupils are equal, round, and reactive to light.  Neck: Normal range of motion. Neck supple.  Pulmonary/Chest: Effort normal.  Abdominal:  Mild right flank tenderness  Musculoskeletal: Normal range of motion.  Neurological: She is alert and oriented to person, place, and time.  Skin: Skin is warm and dry.  Nursing note and vitals reviewed.    UC Treatments / Results  Labs (all labs ordered are listed, but only abnormal results are displayed) Labs Reviewed  POCT URINALYSIS DIP (DEVICE) - Abnormal; Notable for the following:       Result Value   Hgb urine dipstick TRACE (*)    Leukocytes, UA TRACE (*)    All other components within normal limits    EKG  EKG Interpretation None       Radiology No results found.  Procedures Procedures (including critical care time)  Medications Ordered in UC Medications  ciprofloxacin (CIPRO) tablet 500 mg (not administered)     Initial Impression / Assessment and Plan / UC Course  I have reviewed the triage vital signs and the nursing notes.  Pertinent labs & imaging results that were available during my care of the patient were reviewed by me and considered in my medical decision making (see chart for details).     Final Clinical Impressions(s) / UC Diagnoses   Final diagnoses:  Cystitis    New Prescriptions New Prescriptions   CIPROFLOXACIN (CIPRO) 500 MG TABLET    Take 1 tablet (500 mg total) by mouth 2 (two) times daily.     [name], MD 10/01/16 [phone]

## 2017-02-17 ENCOUNTER — Ambulatory Visit (HOSPITAL_COMMUNITY)
Admission: EM | Admit: 2017-02-17 | Discharge: 2017-02-17 | Disposition: A | Discharge status: Other Acute Inpatient Hospital | Payer: BLUE CROSS/BLUE SHIELD

## 2017-07-14 ENCOUNTER — Ambulatory Visit (HOSPITAL_COMMUNITY)
Admission: EM | Admit: 2017-07-14 | Discharge: 2017-07-14 | Disposition: A | Discharge status: Home / Self Care | Payer: BLUE CROSS/BLUE SHIELD | Attending: Family Medicine | Admitting: Family Medicine

## 2017-07-14 ENCOUNTER — Encounter (HOSPITAL_COMMUNITY): Payer: Self-pay | Admitting: Emergency Medicine

## 2017-07-14 DIAGNOSIS — A084 Viral intestinal infection, unspecified: Secondary | ICD-10-CM | POA: Diagnosis not present

## 2017-07-14 MED ORDER — ONDANSETRON 8 MG PO TBDP: 8.0000 mg | ORAL_TABLET | Freq: Three times a day (TID) | ORAL | 0 refills | Status: DC | PRN

## 2017-07-14 NOTE — ED Triage Notes (Signed)
PT C/O: abd pain onset 4 days associated w/vomiting, diarrhea, dizziness, HA, chills, body aches  Sts diarrhea has subsided since taking Imodium   Also needing note for work   DENIES: fevers  TAKING MEDS: Motrin    A&O x4... NAD... Ambulatory

## 2017-07-14 NOTE — Discharge Instructions (Signed)
Take probiotic three times a day for next several days

## 2017-07-14 NOTE — ED Provider Notes (Signed)
[facility]   [id number] 07/14/17 Arrival Time: 1005   SUBJECTIVE:  [name] is a 32 y.o. 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 Abnormal Pap smear 08/2003   colpo  . Anemia    History -FeSO4 SUPP IN PAST  . Carpal tunnel syndrome of right wrist   . Diastasis recti 12/12/08  . 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 GERD    zantac  . H/O chest pain 06/17/11, 06/2013   Resolved 06/2013  . H/O varicella   . Headache(784.0)    migraines;D/T BP  . Herpes    cold sores  . History of CT scan of chest    a. Cardiac CTA 10/16: no CAD  . History of echocardiogram    a. Echo 10/16: EF 50%, diff HK, mild TR, mild PI  . History of kidney stones    passed stone - no surgery required  . History of stress test    a. ETT-Echo 10/16: normal ECGs, + LAD area ischemia >> will arrange Cardiac CTA  . HTN (hypertension)   . Hyperlipidemia   . Hypertension   . Low BMI   . LV dysfunction 12/2012, 06/2013   Hx: EF 45-50%, Resolved by Jan 2015 echo- EF 50-55%  . Palpitations    Hx only - no problems  . Panic attack    HAS BEEN RX'D XANAX  . Pelvic pain    With pregnancy  . Postpartum hypertension   . Preeclampsia 2010,2011   POST PARTUM;WAS PUT ON BP MEDS  . Preterm contractions 2007   GIVEN BETAMETHASONE AND PROCARDIA TO STOP CTXS  . Right ovarian cyst 2003   RT OOPHRECTOMY  . Spotting in first trimester    With SAB  . Stress    work and at home - no meds  . SVD (spontaneous vaginal delivery)    x 5   Family History  Problem Relation Age of Onset  . Heart disease Mother        CHF  . Hypertension Mother   . Asthma Mother        CHILDHOOD  . Diabetes Mother   . Kidney disease Mother    dialysis  . Depression Mother   . Alcohol abuse Mother   . Drug abuse Mother   . Heart failure Mother   . Hypertension Father   . Alcohol abuse Father   . Drug abuse Father   . Asthma Sister   . COPD Sister        chronic bronchitis  . Depression Sister        ATTEMPTED SUICIDE  . Arrhythmia Sister        needs ablation   . Hypertension Maternal Grandmother   . Diabetes Maternal Grandmother    Social History   Socioeconomic History  . Marital status: Married    Spouse name: Not on file  . Number of children: 4  . Years of education: 62  . Highest education level: Not on file  Social Needs  . Financial resource strain: Not on file  . Food insecurity - worry: Not on file  . Food insecurity - inability: Not on file  . Transportation needs - medical: Not on  file  . Transportation needs - non-medical: Not on file  Occupational History  . Occupation: STUDENT  Tobacco Use  . Smoking status: Never Smoker  . Smokeless tobacco: Never Used  Substance and Sexual Activity  . Alcohol use: Yes    Comment: rarely  . Drug use: No  . Sexual activity: Yes    Partners: Male    Birth control/protection: Surgical    Comment: tubal  Other Topics Concern  . Not on file  Social History Narrative  . Not on file   Current Meds  Medication Sig  . amLODipine (NORVASC) 5 MG tablet TAKE 1 TABLET EVERY DAY  . BIOTIN PO Take by mouth.  . losartan (COZAAR) 50 MG tablet TAKE 1 TABLET (50 MG TOTAL) BY MOUTH TWICE A DAY  . omeprazole (PRILOSEC) 40 MG capsule Take 40 mg by mouth daily.  . traZODone (DESYREL) 50 MG tablet Take 50 mg by mouth at bedtime.   Allergies  Allergen Reactions  . Hydrochlorothiazide Other (See Comments)    Patient states that it was years ago when she had a reaction to HCTZ and she can not remember what it was  . Labetalol Other (See Comments)    Alopecia   . Lisinopril Cough    cough      ROS: As per HPI, remainder of ROS  negative.   OBJECTIVE:   Vitals:   07/14/17 1040  BP: 118/85  Pulse: 80  Resp: 18  Temp: 98.8 F (37.1 C)  TempSrc: Oral  SpO2: 97%     General appearance: alert; no distress Eyes: PERRL; EOMI; conjunctiva normal HENT: normocephalic; atraumatic; TMs normal, canal normal, external ears normal without trauma; nasal mucosa normal; oral mucosa normal Neck: supple Lungs: clear to auscultation bilaterally Heart: regular rate and rhythm Abdomen: soft, non-tender; bowel sounds normal; no masses or organomegaly; no guarding or rebound tenderness Back: no CVA tenderness Extremities: no cyanosis or edema; symmetrical with no gross deformities Skin: warm and dry Neurologic: normal gait; grossly normal Psychological: alert and cooperative; normal mood and affect      Labs:  Results for orders placed or performed during the hospital encounter of 10/01/16  POCT urinalysis dip (device)  Result Value Ref Range   Glucose, UA NEGATIVE NEGATIVE mg/dL   Bilirubin Urine NEGATIVE NEGATIVE   Ketones, ur NEGATIVE NEGATIVE mg/dL   Specific Gravity, Urine 1.025 1.005 - 1.030   Hgb urine dipstick TRACE (A) NEGATIVE   pH 6.5 5.0 - 8.0   Protein, ur NEGATIVE NEGATIVE mg/dL   Urobilinogen, UA 0.2 0.0 - 1.0 mg/dL   Nitrite NEGATIVE NEGATIVE   Leukocytes, UA TRACE (A) NEGATIVE    Labs Reviewed - No data to display  No results found.     ASSESSMENT & PLAN:  1. Viral gastroenteritis     Meds ordered this encounter  Medications  . ondansetron (ZOFRAN-ODT) 8 MG disintegrating tablet    Sig: Take 1 tablet (8 mg total) by mouth every 8 (eight) hours as needed for nausea.    Dispense:  12 tablet    Refill:  0    Reviewed expectations re: course of current medical issues. Questions answered. Outlined signs and symptoms indicating need for more acute intervention. Patient verbalized understanding. After Visit Summary given.    Procedures:      [name], MD 07/14/17  1119

## 2017-11-23 ENCOUNTER — Other Ambulatory Visit: Payer: Self-pay

## 2017-11-23 ENCOUNTER — Emergency Department (HOSPITAL_COMMUNITY): Payer: BLUE CROSS/BLUE SHIELD

## 2017-11-23 ENCOUNTER — Encounter (HOSPITAL_COMMUNITY): Payer: Self-pay

## 2017-11-23 ENCOUNTER — Emergency Department (HOSPITAL_COMMUNITY)
Admission: EM | Admit: 2017-11-23 | Discharge: 2017-11-23 | Disposition: A | Discharge status: Home / Self Care | Payer: BLUE CROSS/BLUE SHIELD | Attending: Emergency Medicine | Admitting: Emergency Medicine

## 2017-11-23 DIAGNOSIS — R200 Anesthesia of skin: Secondary | ICD-10-CM | POA: Insufficient documentation

## 2017-11-23 DIAGNOSIS — Z79899 Other long term (current) drug therapy: Secondary | ICD-10-CM | POA: Insufficient documentation

## 2017-11-23 DIAGNOSIS — R0789 Other chest pain: Secondary | ICD-10-CM | POA: Insufficient documentation

## 2017-11-23 DIAGNOSIS — I10 Essential (primary) hypertension: Secondary | ICD-10-CM | POA: Diagnosis not present

## 2017-11-23 LAB — CBC
HEMATOCRIT: 39.7 % (ref 36.0–46.0)
Hemoglobin: 12.6 g/dL (ref 12.0–15.0)
MCH: 25.2 pg — AB (ref 26.0–34.0)
MCHC: 31.7 g/dL (ref 30.0–36.0)
MCV: 79.4 fL (ref 78.0–100.0)
PLATELETS: 276 10*3/uL (ref 150–400)
RBC: 5 MIL/uL (ref 3.87–5.11)
RDW: 14 % (ref 11.5–15.5)
WBC: 10.7 10*3/uL — ABNORMAL HIGH (ref 4.0–10.5)

## 2017-11-23 LAB — BASIC METABOLIC PANEL
Anion gap: 9 (ref 5–15)
BUN: 13 mg/dL (ref 6–20)
CHLORIDE: 105 mmol/L (ref 101–111)
CO2: 27 mmol/L (ref 22–32)
Calcium: 9.8 mg/dL (ref 8.9–10.3)
Creatinine, Ser: 0.94 mg/dL (ref 0.44–1.00)
GFR calc Af Amer: >60 mL/min (ref >60)
GFR calc non Af Amer: >60 mL/min (ref >60)
Glucose, Bld: 92 mg/dL (ref 65–99)
Potassium: 3.8 mmol/L (ref 3.5–5.1)
Sodium: 141 mmol/L (ref 135–145)

## 2017-11-23 LAB — HEPATIC FUNCTION PANEL
ALK PHOS: 84 U/L (ref 38–126)
ALT: 8 U/L — ABNORMAL LOW (ref 14–54)
AST: 15 U/L (ref 15–41)
Albumin: 4.6 g/dL (ref 3.5–5.0)
BILIRUBIN DIRECT: 0.1 mg/dL (ref 0.1–0.5)
BILIRUBIN INDIRECT: 0.1 mg/dL — AB (ref 0.3–0.9)
TOTAL PROTEIN: 8 g/dL (ref 6.5–8.1)
Total Bilirubin: 0.2 mg/dL — ABNORMAL LOW (ref 0.3–1.2)

## 2017-11-23 LAB — I-STAT TROPONIN, ED: Troponin i, poc: 0 ng/mL (ref 0.00–0.08)

## 2017-11-23 LAB — I-STAT BETA HCG BLOOD, ED (MC, WL, AP ONLY): I-stat hCG, quantitative: <5 m[IU]/mL (ref <5)

## 2017-11-23 NOTE — ED Provider Notes (Signed)
[facility] DEPT Provider Note   CSN: [id number] Arrival date & time: 11/23/17  1729     History   Chief Complaint Chief Complaint  Patient presents with  . Chest Pain  . Numbness    HPI [name] is a 32 y.o. female.  Patient states that she was having some chest discomfort if he felt weak also she was seeing streaks of like flickering in her head.  The history is provided by the patient. No language interpreter was used.  Chest Pain   This is a new problem. The current episode started less than 1 hour ago. The problem occurs rarely. The problem has been resolved. The pain is associated with an emotional upset. The pain is present in the substernal region. The pain is at a severity of 4/10. The pain is mild. The quality of the pain is described as brief. Pertinent negatives include no abdominal pain, no back pain, no cough and no headaches.  Pertinent negatives for past medical history include no seizures.    Past Medical History:  Diagnosis Date  . Abnormal Pap smear 08/2003   colpo  . Anemia    History -FeSO4 SUPP IN PAST  . Carpal tunnel syndrome of right wrist   . Diastasis recti 12/12/08  . 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 GERD    zantac  . H/O chest pain 06/17/11, 06/2013   Resolved 06/2013  . H/O varicella   . Headache(784.0)    migraines;D/T BP  . Herpes    cold sores  . History of CT scan of chest    a. Cardiac CTA 10/16: no CAD  . History of echocardiogram    a. Echo 10/16: EF 50%, diff HK, mild TR, mild PI  . History of kidney stones    passed stone - no surgery required  . History of stress test    a. ETT-Echo 10/16: normal ECGs, + LAD area ischemia >> will arrange Cardiac CTA  . HTN (hypertension)   . Hyperlipidemia   . Hypertension   . Low BMI   . LV dysfunction 12/2012, 06/2013   Hx: EF 45-50%, Resolved by Jan 2015 echo- EF 50-55%  . Palpitations    Hx only - no problems  . Panic attack    HAS BEEN RX'D  XANAX  . Pelvic pain    With pregnancy  . Postpartum hypertension   . Preeclampsia 2010,2011   POST PARTUM;WAS PUT ON BP MEDS  . Preterm contractions 2007   GIVEN BETAMETHASONE AND PROCARDIA TO STOP CTXS  . Right ovarian cyst 2003   RT OOPHRECTOMY  . Spotting in first trimester    With SAB  . Stress    work and at home - no meds  . SVD (spontaneous vaginal delivery)    x 5    Patient Active Problem List   Diagnosis Date Noted  . Fever 10/19/2013  . Fever of unknown origin 10/18/2013  . Anxiety 06/28/2013  . Hypokalemia 04/28/2013  . Chest pain at rest 01/06/2013  . LV dysfunction, by Echo EF 45-50%- resolved by Echo Jan 2015 01/06/2013  . Tachycardia 01/06/2013  . Musculoskeletal leg pain 03/31/2012  . Herpes   . H/O varicella   . Pelvic pain   . Panic attack   . Right ovarian cyst   . Anemia   . GERD   . Hypertension 11/13/2011  . H/O chest pain 06/17/2011  . Diastasis recti 12/12/2008  .  Abnormal Pap smear 08/14/2003    Past Surgical History:  Procedure Laterality Date  . breast cyst removal  2005   right  . DILATION AND CURETTAGE OF UTERUS  2009  . DILITATION & CURRETTAGE/HYSTROSCOPY WITH THERMACHOICE ABLATION N/A 10/11/2013   Procedure: DILATATION & CURETTAGE/HYSTEROSCOPY WITH THERMACHOICE ABLATION;  Surgeon: Betsy Coder, MD;  Location: [facility];  Service: Gynecology;  Laterality: N/A;  . LAPAROSCOPY N/A 10/11/2013   Procedure: LAPAROSCOPY OPERATIVE WITH REMOVAL OF HYDROSALPINX ;  Surgeon: Betsy Coder, MD;  Location: [facility];  Service: Gynecology;  Laterality: N/A;  . ovary removed     right - laparotomy  . TUBAL LIGATION  07/03/2012   Procedure: POST PARTUM TUBAL LIGATION;  Surgeon: Delice Lesch, MD;  Location: [facility];  Service: Gynecology;  Laterality: Bilateral;  Bilateral post partum tubal ligation  . WISDOM TOOTH EXTRACTION       OB History    Gravida  6   Para  5   Term  5   Preterm      AB  1   Living  5     SAB  1   TAB       Ectopic      Multiple      Live Births  5            Home Medications    Prior to Admission medications   Medication Sig Start Date End Date Taking? Authorizing Provider  amLODipine (NORVASC) 5 MG tablet TAKE 1 TABLET EVERY DAY 03/06/15  Yes [provider]  BIOTIN PO Take 1 tablet by mouth daily.    Yes [provider]  losartan (COZAAR) 100 MG tablet Take 100 mg by mouth daily. 11/18/17  Yes [provider]  omeprazole (PRILOSEC) 40 MG capsule Take 40 mg by mouth daily.   Yes [provider]  traZODone (DESYREL) 100 MG tablet TAKE 1 TABLET BY MOUTH EVERY DAY AT BEDTIME 09/12/17  Yes [provider]  benzonatate (TESSALON) 100 MG capsule TAKE 1 CAPSULE BY MOUTH THREE TIMES A DAY AS NEEDED FOR COUGH 10/16/17   [provider]  LINZESS 145 MCG CAPS capsule Take 145 mcg by mouth daily as needed (ibs).  08/27/17   [provider]  ondansetron (ZOFRAN-ODT) 8 MG disintegrating tablet Take 1 tablet (8 mg total) by mouth every 8 (eight) hours as needed for nausea. Patient not taking: Reported on 11/23/2017 07/14/17   [name], MD    Family History Family History  Problem Relation Age of Onset  . Heart disease Mother        CHF  . Hypertension Mother   . Asthma Mother        CHILDHOOD  . Diabetes Mother   . Kidney disease Mother        dialysis  . Depression Mother   . Alcohol abuse Mother   . Drug abuse Mother   . Heart failure Mother   . Hypertension Father   . Alcohol abuse Father   . Drug abuse Father   . Asthma Sister   . COPD Sister        chronic bronchitis  . Depression Sister        ATTEMPTED SUICIDE  . Arrhythmia Sister        needs ablation   . Hypertension Maternal Grandmother   . Diabetes Maternal Grandmother     Social History Social History   Tobacco Use  . Smoking status: Never Smoker  .  Smokeless tobacco: Never Used  Substance Use Topics  . Alcohol use: Yes    Comment: rarely  .  Drug use: No     Allergies   Hydrochlorothiazide; Labetalol; and Lisinopril   Review of Systems Review of Systems  Constitutional: Negative for appetite change and fatigue.  HENT: Negative for congestion, ear discharge and sinus pressure.   Eyes: Negative for discharge.  Respiratory: Negative for cough.   Cardiovascular: Positive for chest pain.  Gastrointestinal: Negative for abdominal pain and diarrhea.  Genitourinary: Negative for frequency and hematuria.  Musculoskeletal: Negative for back pain.  Skin: Negative for rash.  Neurological: Negative for seizures and headaches.       Physical problems with streaks of lights  Psychiatric/Behavioral: Negative for hallucinations.     Physical Exam Updated Vital Signs BP (!) 148/99   Pulse 70   Temp 98.2 F (36.8 C) (Oral)   Resp (!) 26   Ht 5\' 5"  (1.651 m)   Wt 79.8 kg (176 lb)   LMP 11/18/2017   SpO2 100%   BMI 29.29 kg/m   Physical Exam HEENT normal. lungs clear heart regular rate and rhythm without murmurs rubs or gallops abdomen soft bowel sounds present no masses or hepatosplenomegaly neuro exam patient moves all extremities without problems.  Patient has normal motor and sensation throughout.  Psychiatric exam patient is not hallucinations and is not suicidal.  Skin exam is normal.  Additional neuro exam patient is oriented x4.  ED Treatments / Results  Labs (all labs ordered are listed, but only abnormal results are displayed) Labs Reviewed  CBC - Abnormal; Notable for the following components:      Result Value   WBC 10.7 (*)    MCH 25.2 (*)    All other components within normal limits  HEPATIC FUNCTION PANEL - Abnormal; Notable for the following components:   ALT 8 (*)    Total Bilirubin 0.2 (*)    Indirect Bilirubin 0.1 (*)    All other components within normal limits  BASIC METABOLIC PANEL  I-STAT TROPONIN, ED  I-STAT BETA HCG BLOOD, ED ([facility], [facility], [facility] ONLY)    EKG EKG  Interpretation  Date/Time:  Tuesday November 23 2017 17:43:29 EDT Ventricular Rate:  72 PR Interval:    QRS Duration: 86 QT Interval:  384 QTC Calculation: 421 R Axis:   54 Text Interpretation:  Sinus rhythm Confirmed by Milton Ferguson [phone]) on 11/23/2017 7:45:55 PM   Radiology Dg Chest 2 View  Result Date: 11/23/2017 CLINICAL DATA:  Bilateral weakness.  Chest pain EXAM: CHEST - 2 VIEW COMPARISON:  07/28/2016 FINDINGS: The heart size and mediastinal contours are within normal limits. Both lungs are clear. The visualized skeletal structures are unremarkable. IMPRESSION: No active cardiopulmonary disease. Electronically Signed   By: Van Clines M.D.   On: 11/23/2017 19:24   Ct Head Wo Contrast  Result Date: 11/23/2017 CLINICAL DATA:  Bilateral weakness, worse on the right. Right-sided head ache near the temple. EXAM: CT HEAD WITHOUT CONTRAST CT CERVICAL SPINE WITHOUT CONTRAST TECHNIQUE: Multidetector CT imaging of the head and cervical spine was performed following the standard protocol without intravenous contrast. Multiplanar CT image reconstructions of the cervical spine were also generated. COMPARISON:  Head CT 07/18/2011 FINDINGS: CT HEAD FINDINGS BRAIN: The ventricles and sulci are normal. No intraparenchymal hemorrhage, mass effect nor midline shift. No acute large vascular territory infarcts. No abnormal extra-axial fluid collections. Basal cisterns are midline and not effaced. No acute cerebellar abnormality. VASCULAR: Unremarkable. SKULL/SOFT  TISSUES: No skull fracture. No significant soft tissue swelling. ORBITS/SINUSES: The included ocular globes and orbital contents are normal.The mastoid air-cells and included paranasal sinuses are well-aerated. OTHER: None. CT CERVICAL SPINE FINDINGS ALIGNMENT: Vertebral bodies in alignment. Maintained lordosis. SKULL BASE AND VERTEBRAE: Cervical vertebral bodies and posterior elements are intact. Intervertebral disc heights preserved. No  destructive bony lesions. C1-2 articulation maintained. SOFT TISSUES AND SPINAL CANAL: Normal. DISC LEVELS: No significant osseous canal stenosis or neural foraminal narrowing. UPPER CHEST: Lung apices are clear. OTHER: None. IMPRESSION: 1. Normal head CT.  No significant change identified. 2. No acute abnormality of the cervical spine. Electronically Signed   By: Ashley Royalty M.D.   On: 11/23/2017 20:52   Ct Cervical Spine Wo Contrast  Result Date: 11/23/2017 CLINICAL DATA:  Bilateral weakness, worse on the right. Right-sided head ache near the temple. EXAM: CT HEAD WITHOUT CONTRAST CT CERVICAL SPINE WITHOUT CONTRAST TECHNIQUE: Multidetector CT imaging of the head and cervical spine was performed following the standard protocol without intravenous contrast. Multiplanar CT image reconstructions of the cervical spine were also generated. COMPARISON:  Head CT 07/18/2011 FINDINGS: CT HEAD FINDINGS BRAIN: The ventricles and sulci are normal. No intraparenchymal hemorrhage, mass effect nor midline shift. No acute large vascular territory infarcts. No abnormal extra-axial fluid collections. Basal cisterns are midline and not effaced. No acute cerebellar abnormality. VASCULAR: Unremarkable. SKULL/SOFT TISSUES: No skull fracture. No significant soft tissue swelling. ORBITS/SINUSES: The included ocular globes and orbital contents are normal.The mastoid air-cells and included paranasal sinuses are well-aerated. OTHER: None. CT CERVICAL SPINE FINDINGS ALIGNMENT: Vertebral bodies in alignment. Maintained lordosis. SKULL BASE AND VERTEBRAE: Cervical vertebral bodies and posterior elements are intact. Intervertebral disc heights preserved. No destructive bony lesions. C1-2 articulation maintained. SOFT TISSUES AND SPINAL CANAL: Normal. DISC LEVELS: No significant osseous canal stenosis or neural foraminal narrowing. UPPER CHEST: Lung apices are clear. OTHER: None. IMPRESSION: 1. Normal head CT.  No significant change  identified. 2. No acute abnormality of the cervical spine. Electronically Signed   By: Ashley Royalty M.D.   On: 11/23/2017 20:52    Procedures Procedures (including critical care time)  Medications Ordered in ED Medications - No data to display   Initial Impression / Assessment and Plan / ED Course  I have reviewed the triage vital signs and the nursing notes.  Pertinent labs & imaging results that were available during my care of the patient were reviewed by me and considered in my medical decision making (see chart for details).     Patient with normal CBC chemistries troponin EKG chest x-ray CT head CT cervical spine.  Patient with atypical chest pain most likely related to stress.  Patient with visual symptoms possibly migraine variant.  She will follow-up with her PCP  Final Clinical Impressions(s) / ED Diagnoses   Final diagnoses:  Atypical chest pain    ED Discharge Orders    None       Milton Ferguson, MD 11/23/17 2321

## 2017-11-23 NOTE — Discharge Instructions (Addendum)
Follow-up with your family doctor next week.  Return to the emergency department if any problem

## 2017-11-23 NOTE — ED Triage Notes (Signed)
x1 week. Bilateral weakness, but worse on the right. FROM. Pt states sharp pain on right side of head, near temple, but also all over body. Tingling in feet.

## 2017-12-04 ENCOUNTER — Ambulatory Visit (HOSPITAL_COMMUNITY)
Admission: EM | Admit: 2017-12-04 | Discharge: 2017-12-04 | Disposition: A | Discharge status: Nursing Home (Includes ICF) | Payer: BLUE CROSS/BLUE SHIELD | Source: Home / Self Care

## 2017-12-04 ENCOUNTER — Other Ambulatory Visit: Payer: Self-pay

## 2017-12-04 ENCOUNTER — Emergency Department (HOSPITAL_BASED_OUTPATIENT_CLINIC_OR_DEPARTMENT_OTHER)
Admission: EM | Admit: 2017-12-04 | Discharge: 2017-12-05 | Disposition: A | Discharge status: Home / Self Care | Payer: BLUE CROSS/BLUE SHIELD | Attending: Emergency Medicine | Admitting: Emergency Medicine

## 2017-12-04 ENCOUNTER — Encounter (HOSPITAL_BASED_OUTPATIENT_CLINIC_OR_DEPARTMENT_OTHER): Payer: Self-pay | Admitting: Emergency Medicine

## 2017-12-04 ENCOUNTER — Encounter (HOSPITAL_COMMUNITY): Payer: Self-pay | Admitting: *Deleted

## 2017-12-04 DIAGNOSIS — I10 Essential (primary) hypertension: Secondary | ICD-10-CM | POA: Diagnosis not present

## 2017-12-04 DIAGNOSIS — Z79899 Other long term (current) drug therapy: Secondary | ICD-10-CM | POA: Diagnosis not present

## 2017-12-04 DIAGNOSIS — R1013 Epigastric pain: Secondary | ICD-10-CM | POA: Insufficient documentation

## 2017-12-04 DIAGNOSIS — R1012 Left upper quadrant pain: Secondary | ICD-10-CM | POA: Diagnosis present

## 2017-12-04 LAB — CBC
HEMATOCRIT: 38.5 % (ref 36.0–46.0)
HEMOGLOBIN: 12.4 g/dL (ref 12.0–15.0)
MCH: 25.1 pg — AB (ref 26.0–34.0)
MCHC: 32.2 g/dL (ref 30.0–36.0)
MCV: 77.8 fL — ABNORMAL LOW (ref 78.0–100.0)
Platelets: 221 10*3/uL (ref 150–400)
RBC: 4.95 MIL/uL (ref 3.87–5.11)
RDW: 14.7 % (ref 11.5–15.5)
WBC: 5.8 10*3/uL (ref 4.0–10.5)

## 2017-12-04 LAB — URINALYSIS, ROUTINE W REFLEX MICROSCOPIC
BILIRUBIN URINE: NEGATIVE
GLUCOSE, UA: NEGATIVE mg/dL
Ketones, ur: NEGATIVE mg/dL
Nitrite: NEGATIVE
PH: 6.5 (ref 5.0–8.0)
Protein, ur: NEGATIVE mg/dL
SPECIFIC GRAVITY, URINE: 1.025 (ref 1.005–1.030)

## 2017-12-04 LAB — COMPREHENSIVE METABOLIC PANEL
ALBUMIN: 3.9 g/dL (ref 3.5–5.0)
ALK PHOS: 83 U/L (ref 38–126)
ALT: 13 U/L — ABNORMAL LOW (ref 14–54)
ANION GAP: 7 (ref 5–15)
AST: 20 U/L (ref 15–41)
BUN: 15 mg/dL (ref 6–20)
CO2: 26 mmol/L (ref 22–32)
Calcium: 9.1 mg/dL (ref 8.9–10.3)
Chloride: 105 mmol/L (ref 101–111)
Creatinine, Ser: 0.93 mg/dL (ref 0.44–1.00)
GFR calc Af Amer: >60 mL/min (ref >60)
GFR calc non Af Amer: >60 mL/min (ref >60)
GLUCOSE: 93 mg/dL (ref 65–99)
POTASSIUM: 4 mmol/L (ref 3.5–5.1)
SODIUM: 138 mmol/L (ref 135–145)
Total Bilirubin: 0.3 mg/dL (ref 0.3–1.2)
Total Protein: 7.3 g/dL (ref 6.5–8.1)

## 2017-12-04 LAB — URINALYSIS, MICROSCOPIC (REFLEX)

## 2017-12-04 LAB — PREGNANCY, URINE: Preg Test, Ur: NEGATIVE

## 2017-12-04 LAB — TROPONIN I

## 2017-12-04 LAB — LIPASE, BLOOD: Lipase: 32 U/L (ref 11–51)

## 2017-12-04 NOTE — ED Triage Notes (Signed)
Patient informed to go to ED per Enrique Sack, NP. Patient verbalizes understanding and agrees to do so.

## 2017-12-04 NOTE — ED Triage Notes (Signed)
Patient stats that she is having mid epigastric pain and to the middle of her back - the patient reports that it hurts when she eats

## 2017-12-04 NOTE — ED Triage Notes (Addendum)
Pt complaining of intermittent upper abdominal, and back pain of 10/10. Pt states abdominal pain radiates to chest and worsens after eating. Pt was seen in ED one week ago for dizziness/migraine.

## 2017-12-05 MED ORDER — OMEPRAZOLE 40 MG PO CPDR: DELAYED_RELEASE_CAPSULE | ORAL | 0 refills | Status: DC

## 2017-12-05 MED ORDER — SUCRALFATE 1 G PO TABS: 1.0000 g | ORAL_TABLET | Freq: Three times a day (TID) | ORAL | 0 refills | Status: DC

## 2017-12-05 MED ORDER — ONDANSETRON HCL 4 MG/2ML IJ SOLN
4.0000 mg | Freq: Once | INTRAMUSCULAR | Status: DC
Start: 2017-12-05 — End: 2017-12-05
  Filled 2017-12-05: qty 2

## 2017-12-05 MED ORDER — SUCRALFATE 1 GM/10ML PO SUSP
1.0000 g | Freq: Once | ORAL | Status: AC
  Administered 2017-12-05: 1 g via ORAL
  Filled 2017-12-05: qty 10

## 2017-12-05 MED ORDER — FAMOTIDINE IN NACL 20-0.9 MG/50ML-% IV SOLN
20.0000 mg | Freq: Once | INTRAVENOUS | Status: DC
  Filled 2017-12-05: qty 50

## 2017-12-05 NOTE — ED Provider Notes (Addendum)
[facility] Provider Note: Georgena Spurling, MD, FACEP  CSN: [id number] MRN: [id number] ARRIVAL: 12/04/17 at 2019 ROOM: [place]  Abdominal Pain   HISTORY OF PRESENT ILLNESS  12/05/17 12:45 AM [name] is a 32 y.o. female who complains of epigastric and left upper quadrant pain, radiating to her back and substernal region that began yesterday morning after drinking coffee.  It has occurred anytime she drinks coffee or eats since.  She has difficulty characterizing the pain but describes it as a churning or a sharp pain.  It is moderate to severe in intensity.  She has had one episode of vomiting but no nausea or diarrhea.  She is not nauseated presently.  She denies shortness of breath or dysuria.  She started Macrobid yesterday evening for urinary tract infection.  The abdominal pain symptoms preceded the first dose of Macrobid.   Past Medical History:  Diagnosis Date  . Abnormal Pap smear 08/2003   colpo  . Anemia    History -FeSO4 SUPP IN PAST  . Carpal tunnel syndrome of right wrist   . Diastasis recti 12/12/08  . 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 GERD    zantac  . H/O chest pain 06/17/11, 06/2013   Resolved 06/2013  . H/O varicella   . Headache(784.0)    migraines;D/T BP  . Herpes    cold sores  . History of CT scan of chest    a. Cardiac CTA 10/16: no CAD  . History of echocardiogram    a. Echo 10/16: EF 50%, diff HK, mild TR, mild PI  . History of kidney stones    passed stone - no surgery required  . History of stress test    a. ETT-Echo 10/16: normal ECGs, + LAD area ischemia >> will arrange Cardiac CTA  . HTN (hypertension)   . Hyperlipidemia   . Hypertension   . Low BMI   . LV dysfunction 12/2012, 06/2013   Hx: EF 45-50%, Resolved by Jan 2015 echo- EF 50-55%  . Palpitations    Hx only - no problems  . Panic attack    HAS BEEN RX'D XANAX  . Pelvic pain    With pregnancy  . Postpartum hypertension   . Preeclampsia  2010,2011   POST PARTUM;WAS PUT ON BP MEDS  . Preterm contractions 2007   GIVEN BETAMETHASONE AND PROCARDIA TO STOP CTXS  . Right ovarian cyst 2003   RT OOPHRECTOMY  . Spotting in first trimester    With SAB  . Stress    work and at home - no meds  . SVD (spontaneous vaginal delivery)    x 5    Past Surgical History:  Procedure Laterality Date  . breast cyst removal  2005   right  . DILATION AND CURETTAGE OF UTERUS  2009  . DILITATION & CURRETTAGE/HYSTROSCOPY WITH THERMACHOICE ABLATION N/A 10/11/2013   Procedure: DILATATION & CURETTAGE/HYSTEROSCOPY WITH THERMACHOICE ABLATION;  Surgeon: Betsy Coder, MD;  Location: [facility];  Service: Gynecology;  Laterality: N/A;  . LAPAROSCOPY N/A 10/11/2013   Procedure: LAPAROSCOPY OPERATIVE WITH REMOVAL OF HYDROSALPINX ;  Surgeon: Betsy Coder, MD;  Location: [facility];  Service: Gynecology;  Laterality: N/A;  . ovary removed     right - laparotomy  . TUBAL LIGATION  07/03/2012   Procedure: POST PARTUM TUBAL LIGATION;  Surgeon: Delice Lesch, MD;  Location: [facility];  Service: Gynecology;  Laterality: Bilateral;  Bilateral post  partum tubal ligation  . WISDOM TOOTH EXTRACTION      Family History  Problem Relation Age of Onset  . Heart disease Mother        CHF  . Hypertension Mother   . Asthma Mother        CHILDHOOD  . Diabetes Mother   . Kidney disease Mother        dialysis  . Depression Mother   . Alcohol abuse Mother   . Drug abuse Mother   . Heart failure Mother   . Hypertension Father   . Alcohol abuse Father   . Drug abuse Father   . Asthma Sister   . COPD Sister        chronic bronchitis  . Depression Sister        ATTEMPTED SUICIDE  . Arrhythmia Sister        needs ablation   . Hypertension Maternal Grandmother   . Diabetes Maternal Grandmother     Social History   Tobacco Use  . Smoking status: Never Smoker  . Smokeless tobacco: Never Used  Substance Use Topics  . Alcohol use: Yes    Comment: rarely  .  Drug use: No    Prior to Admission medications   Medication Sig Start Date End Date Taking? Authorizing Provider  amLODipine (NORVASC) 5 MG tablet TAKE 1 TABLET EVERY DAY 03/06/15   [provider]  benzonatate (TESSALON) 100 MG capsule TAKE 1 CAPSULE BY MOUTH THREE TIMES A DAY AS NEEDED FOR COUGH 10/16/17   [provider]  BIOTIN PO Take 1 tablet by mouth daily.     [provider]  LINZESS 145 MCG CAPS capsule Take 145 mcg by mouth daily as needed (ibs).  08/27/17   [provider]  losartan (COZAAR) 100 MG tablet Take 100 mg by mouth daily. 11/18/17   [provider]  omeprazole (PRILOSEC) 40 MG capsule Take 40 mg by mouth daily.    [provider]  ondansetron (ZOFRAN-ODT) 8 MG disintegrating tablet Take 1 tablet (8 mg total) by mouth every 8 (eight) hours as needed for nausea. Patient not taking: Reported on 11/23/2017 07/14/17   [name], MD  propranolol (INDERAL) 60 MG tablet Take 40 mg by mouth daily.    Emergency, Nurse, RN  traZODone (DESYREL) 100 MG tablet TAKE 1 TABLET BY MOUTH EVERY DAY AT BEDTIME 09/12/17   [provider]    Allergies Hydrochlorothiazide; Labetalol; and Lisinopril   REVIEW OF SYSTEMS  Negative except as noted here or in the History of Present Illness.   PHYSICAL EXAMINATION  Initial Vital Signs Blood pressure (!) 157/103, pulse 70, temperature 99 F (37.2 C), temperature source Oral, resp. rate 18, height 5\' 5"  (1.651 m), weight 79.8 kg (176 lb), last menstrual period 11/18/2017, SpO2 100 %.  Examination General: Well-developed, well-nourished female in no acute distress; appearance consistent with age of record HENT: normocephalic; atraumatic Eyes: pupils equal, round and reactive to light; extraocular muscles intact Neck: supple Heart: regular rate and rhythm Lungs: clear to auscultation bilaterally Abdomen: soft; nondistended; epigastric tenderness; no masses or hepatosplenomegaly;  bowel sounds present Extremities: No deformity; full range of motion; pulses normal Neurologic: Awake, alert and oriented; motor function intact in all extremities and symmetric; no facial droop Skin: Warm and dry Psychiatric: Normal mood and affect   RESULTS  Summary of this visit's results, reviewed by myself:   EKG Interpretation  Date/Time:  Saturday December 04 2017 21:30:59 EDT Ventricular Rate:  73 PR Interval:  150 QRS Duration: 80 QT Interval:  370 QTC Calculation: 407 R Axis:   28 Text Interpretation:  Normal sinus rhythm Normal ECG No significant change was found Confirmed by Shanon Rosser [phone]) on 12/05/2017 12:46:34 AM      Laboratory Studies: Results for orders placed or performed during the hospital encounter of 12/04/17 (from the past 24 hour(s))  Urinalysis, Routine w reflex microscopic     Status: Abnormal   Collection Time: 12/04/17  8:47 PM  Result Value Ref Range   Color, Urine YELLOW YELLOW   APPearance CLOUDY (A) CLEAR   Specific Gravity, Urine 1.025 1.005 - 1.030   pH 6.5 5.0 - 8.0   Glucose, UA NEGATIVE NEGATIVE mg/dL   Hgb urine dipstick SMALL (A) NEGATIVE   Bilirubin Urine NEGATIVE NEGATIVE   Ketones, ur NEGATIVE NEGATIVE mg/dL   Protein, ur NEGATIVE NEGATIVE mg/dL   Nitrite NEGATIVE NEGATIVE   Leukocytes, UA SMALL (A) NEGATIVE  Pregnancy, urine     Status: None   Collection Time: 12/04/17  8:47 PM  Result Value Ref Range   Preg Test, Ur NEGATIVE NEGATIVE  Urinalysis, Microscopic (reflex)     Status: Abnormal   Collection Time: 12/04/17  8:47 PM  Result Value Ref Range   RBC / HPF 0-5 0 - 5 RBC/hpf   WBC, UA 6-10 0 - 5 WBC/hpf   Bacteria, UA MANY (A) NONE SEEN   Squamous Epithelial / LPF 11-20 0 - 5   Mucus PRESENT   Lipase, blood     Status: None   Collection Time: 12/04/17  9:29 PM  Result Value Ref Range   Lipase 32 11 - 51 U/L  Comprehensive metabolic panel     Status: Abnormal   Collection Time: 12/04/17  9:29 PM  Result Value Ref  Range   Sodium 138 135 - 145 mmol/L   Potassium 4.0 3.5 - 5.1 mmol/L   Chloride 105 101 - 111 mmol/L   CO2 26 22 - 32 mmol/L   Glucose, Bld 93 65 - 99 mg/dL   BUN 15 6 - 20 mg/dL   Creatinine, Ser 0.93 0.44 - 1.00 mg/dL   Calcium 9.1 8.9 - 10.3 mg/dL   Total Protein 7.3 6.5 - 8.1 g/dL   Albumin 3.9 3.5 - 5.0 g/dL   AST 20 15 - 41 U/L   ALT 13 (L) 14 - 54 U/L   Alkaline Phosphatase 83 38 - 126 U/L   Total Bilirubin 0.3 0.3 - 1.2 mg/dL   GFR calc non Af Amer >60 >60 mL/min   GFR calc Af Amer >60 >60 mL/min   Anion gap 7 5 - 15  CBC     Status: Abnormal   Collection Time: 12/04/17  9:29 PM  Result Value Ref Range   WBC 5.8 4.0 - 10.5 K/uL   RBC 4.95 3.87 - 5.11 MIL/uL   Hemoglobin 12.4 12.0 - 15.0 g/dL   HCT 38.5 36.0 - 46.0 %   MCV 77.8 (L) 78.0 - 100.0 fL   MCH 25.1 (L) 26.0 - 34.0 pg   MCHC 32.2 30.0 - 36.0 g/dL   RDW 14.7 11.5 - 15.5 %   Platelets 221 150 - 400 K/uL  Troponin I     Status: None   Collection Time: 12/04/17  9:29 PM  Result Value Ref Range   Troponin I <0.03 <0.03 ng/mL   Imaging Studies: No results found.  ED COURSE and MDM  Nursing notes and initial  vitals signs, including pulse oximetry, reviewed.  Vitals:   12/04/17 2044 12/04/17 2045 12/04/17 2046 12/04/17 2333  BP:   128/86 (!) 157/103  Pulse: 77  80 70  Resp: 18  18 18   Temp: 98.7 F (37.1 C)  98.4 F (36.9 C) 99 F (37.2 C)  TempSrc: Oral  Oral Oral  SpO2: 100%  100% 100%  Weight:  79.8 kg (176 lb)    Height:  5\' 5"  (1.651 m)     Symptoms are consistent with gastritis.  There is no elevated lipase to suggest pancreatitis.  Her other lab work is reassuring.  Her EKG is normal and her troponin is negative.  Her symptoms are not consistent with acute coronary syndrome.  She is already on omeprazole but may be having some acid breakthrough.  We will add Carafate.  PROCEDURES    ED DIAGNOSES     ICD-10-CM   1. Epigastric pain R10.13        Kizer Nobbe, Jenny Reichmann, MD 12/05/17 0100      Shanon Rosser, MD 12/05/17 [id number]

## 2018-06-26 ENCOUNTER — Emergency Department (HOSPITAL_COMMUNITY): Payer: BLUE CROSS/BLUE SHIELD

## 2018-06-26 ENCOUNTER — Emergency Department (HOSPITAL_COMMUNITY)
Admission: EM | Admit: 2018-06-26 | Discharge: 2018-06-27 | Disposition: A | Discharge status: Home / Self Care | Payer: BLUE CROSS/BLUE SHIELD | Attending: Emergency Medicine | Admitting: Emergency Medicine

## 2018-06-26 DIAGNOSIS — J111 Influenza due to unidentified influenza virus with other respiratory manifestations: Secondary | ICD-10-CM | POA: Insufficient documentation

## 2018-06-26 DIAGNOSIS — I10 Essential (primary) hypertension: Secondary | ICD-10-CM | POA: Insufficient documentation

## 2018-06-26 DIAGNOSIS — R0602 Shortness of breath: Secondary | ICD-10-CM | POA: Diagnosis present

## 2018-06-26 DIAGNOSIS — Z79899 Other long term (current) drug therapy: Secondary | ICD-10-CM | POA: Diagnosis not present

## 2018-06-26 DIAGNOSIS — E785 Hyperlipidemia, unspecified: Secondary | ICD-10-CM | POA: Insufficient documentation

## 2018-06-26 DIAGNOSIS — R69 Illness, unspecified: Secondary | ICD-10-CM

## 2018-06-26 LAB — CBC
HEMATOCRIT: 48.5 % — AB (ref 36.0–46.0)
Hemoglobin: 14.4 g/dL (ref 12.0–15.0)
MCH: 23.8 pg — ABNORMAL LOW (ref 26.0–34.0)
MCHC: 29.7 g/dL — ABNORMAL LOW (ref 30.0–36.0)
MCV: 80.2 fL (ref 80.0–100.0)
Platelets: 268 10*3/uL (ref 150–400)
RBC: 6.05 MIL/uL — AB (ref 3.87–5.11)
RDW: 13.4 % (ref 11.5–15.5)
WBC: 7.8 10*3/uL (ref 4.0–10.5)
nRBC: 0 % (ref 0.0–0.2)

## 2018-06-26 LAB — ACETAMINOPHEN LEVEL: Acetaminophen (Tylenol), Serum: <10 ug/mL — ABNORMAL LOW (ref 10–30)

## 2018-06-26 MED ORDER — IPRATROPIUM-ALBUTEROL 0.5-2.5 (3) MG/3ML IN SOLN
3.0000 mL | Freq: Once | RESPIRATORY_TRACT | Status: AC
  Administered 2018-06-26: 3 mL via RESPIRATORY_TRACT
  Filled 2018-06-26: qty 3

## 2018-06-26 NOTE — ED Provider Notes (Signed)
[facility] Provider Note   CSN: [id number] Arrival date & time: 06/26/18  2044     History   Chief Complaint Chief Complaint  Patient presents with  . no improvement on antibiotics    HPI [name] is a 33 y.o. female with a history of HSV, carpal tunnel syndrome, anemia, dysmenorrhea who presents to the emergency department with a chief complaint of shortness of breath.  The patient endorses gradual onset, gradually worsening shortness of breath that began 3 days ago with pain between her shoulder blades.  States "I am almost hunched over and out of breath when I get to where I am going."She reports her shortness of breath is worse with exertion and she is getting winded walking from room to room.  She also reports the pain in her shoulder blades is worse when she takes a deep breath.  She denies chest pain or tightness.     She also reports a fever for the last week.  T-max 102 at home 3 days ago.  She reports associated nasal congestion, sinus pain and pressure, headache, body aches, chills, nonproductive cough.  She has been having nausea, but no vomiting or diarrhea.  Reports she is mostly been eating saltine crackers over the last few days due to the nausea.  She is also been having some aching in her upper abdomen and feeling generally weak with malaise.  She also notes a cramping pain in her bilateral lower legs that is worse with walking.  She reports she was seen by her PCP on January 7 for left ear pain and a left-sided sore throat.  Reports the symptoms have been ongoing for almost a week by the time she was seen by her PCP.  She was started on amoxicillin and her left ear pain and sore throat have resolved, but she endorses right-sided ear pain and a sore throat over the last few days.  She characterizes the pain as sharp and feeling as if someone is stabbing her in the ear with a needle.  No sick contacts at home.  She tested  negative for influenza and streptococcal pharyngitis at her PCPs office.  Of note, she was seen by her PCP on January 3 for dysmenorrhea.  Last menstrual cycle was the beginning of January.  She reports 1 to 2 days of heavy bleeding, but reports this is improved from previously as she had had an endometrial ablation.  She reports she has been taking 2 tablets of DayQuil and 30 mL's of NyQuil every 3-4 hours since her symptoms began almost 2 weeks ago.  She reports she is also been alternating these medications with ibuprofen, Robitussin, and other over-the-counter medications.  The history is provided by the patient. No language interpreter was used.    Past Medical History:  Diagnosis Date  . Abnormal Pap smear 08/2003   colpo  . Anemia    History -FeSO4 SUPP IN PAST  . Carpal tunnel syndrome of right wrist   . Diastasis recti 12/12/08  . 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 GERD    zantac  . H/O chest pain 06/17/11, 06/2013   Resolved 06/2013  . H/O varicella   . Headache(784.0)    migraines;D/T BP  . Herpes    cold sores  . History of CT scan of chest    a. Cardiac CTA 10/16: no CAD  . History of echocardiogram    a. Echo 10/16: EF 50%,  diff HK, mild TR, mild PI  . History of kidney stones    passed stone - no surgery required  . History of stress test    a. ETT-Echo 10/16: normal ECGs, + LAD area ischemia >> will arrange Cardiac CTA  . HTN (hypertension)   . Hyperlipidemia   . Hypertension   . Low BMI   . LV dysfunction 12/2012, 06/2013   Hx: EF 45-50%, Resolved by Jan 2015 echo- EF 50-55%  . Palpitations    Hx only - no problems  . Panic attack    HAS BEEN RX'D XANAX  . Pelvic pain    With pregnancy  . Postpartum hypertension   . Preeclampsia 2010,2011   POST PARTUM;WAS PUT ON BP MEDS  . Preterm contractions 2007   GIVEN BETAMETHASONE AND PROCARDIA TO STOP CTXS  . Right ovarian cyst 2003   RT OOPHRECTOMY  . Spotting in first trimester    With SAB  . Stress    work  and at home - no meds  . SVD (spontaneous vaginal delivery)    x 5    Patient Active Problem List   Diagnosis Date Noted  . Fever 10/19/2013  . Fever of unknown origin 10/18/2013  . Anxiety 06/28/2013  . Hypokalemia 04/28/2013  . Chest pain at rest 01/06/2013  . LV dysfunction, by Echo EF 45-50%- resolved by Echo Jan 2015 01/06/2013  . Tachycardia 01/06/2013  . Musculoskeletal leg pain 03/31/2012  . Herpes   . H/O varicella   . Pelvic pain   . Panic attack   . Right ovarian cyst   . Anemia   . GERD   . Hypertension 11/13/2011  . H/O chest pain 06/17/2011  . Diastasis recti 12/12/2008  . Abnormal Pap smear 08/14/2003    Past Surgical History:  Procedure Laterality Date  . breast cyst removal  2005   right  . DILATION AND CURETTAGE OF UTERUS  2009  . DILITATION & CURRETTAGE/HYSTROSCOPY WITH THERMACHOICE ABLATION N/A 10/11/2013   Procedure: DILATATION & CURETTAGE/HYSTEROSCOPY WITH THERMACHOICE ABLATION;  Surgeon: Betsy Coder, MD;  Location: [facility];  Service: Gynecology;  Laterality: N/A;  . LAPAROSCOPY N/A 10/11/2013   Procedure: LAPAROSCOPY OPERATIVE WITH REMOVAL OF HYDROSALPINX ;  Surgeon: Betsy Coder, MD;  Location: [facility];  Service: Gynecology;  Laterality: N/A;  . ovary removed     right - laparotomy  . TUBAL LIGATION  07/03/2012   Procedure: POST PARTUM TUBAL LIGATION;  Surgeon: Delice Lesch, MD;  Location: [facility];  Service: Gynecology;  Laterality: Bilateral;  Bilateral post partum tubal ligation  . WISDOM TOOTH EXTRACTION       OB History    Gravida  6   Para  5   Term  5   Preterm      AB  1   Living  5     SAB  1   TAB      Ectopic      Multiple      Live Births  5            Home Medications    Prior to Admission medications   Medication Sig Start Date End Date Taking? Authorizing Provider  amLODipine (NORVASC) 5 MG tablet TAKE 1 TABLET EVERY DAY 03/06/15   [provider]  benzonatate (TESSALON) 100 MG capsule  Take 1 capsule (100 mg total) by mouth every 8 (eight) hours. 06/27/18   McDonald, Mia A, PA-C  BIOTIN PO Take 1  tablet by mouth daily.     [provider]  LINZESS 145 MCG CAPS capsule Take 145 mcg by mouth daily as needed (ibs).  08/27/17   [provider]  losartan (COZAAR) 100 MG tablet Take 100 mg by mouth daily. 11/18/17   [provider]  promethazine-dextromethorphan (PROMETHAZINE-DM) 6.25-15 MG/5ML syrup Take 5 mLs by mouth 4 (four) times daily as needed for cough. 06/27/18   McDonald, Mia A, PA-C  propranolol (INDERAL) 60 MG tablet Take 40 mg by mouth daily.    Emergency, Nurse, RN  sucralfate (CARAFATE) 1 g tablet Take 1 tablet (1 g total) by mouth 4 (four) times daily -  with meals and at bedtime. 12/05/17   Molpus, John, MD  traZODone (DESYREL) 100 MG tablet TAKE 1 TABLET BY MOUTH EVERY DAY AT BEDTIME 09/12/17   [provider]    Family History Family History  Problem Relation Age of Onset  . Heart disease Mother        CHF  . Hypertension Mother   . Asthma Mother        CHILDHOOD  . Diabetes Mother   . Kidney disease Mother        dialysis  . Depression Mother   . Alcohol abuse Mother   . Drug abuse Mother   . Heart failure Mother   . Hypertension Father   . Alcohol abuse Father   . Drug abuse Father   . Asthma Sister   . COPD Sister        chronic bronchitis  . Depression Sister        ATTEMPTED SUICIDE  . Arrhythmia Sister        needs ablation   . Hypertension Maternal Grandmother   . Diabetes Maternal Grandmother     Social History Social History   Tobacco Use  . Smoking status: Never Smoker  . Smokeless tobacco: Never Used  Substance Use Topics  . Alcohol use: Yes    Comment: rarely  . Drug use: No     Allergies   Hydrochlorothiazide; Labetalol; and Lisinopril   Review of Systems Review of Systems  Constitutional: Positive for chills and fever. Negative for activity change.  HENT: Positive for congestion, ear  pain and sore throat. Negative for sinus pressure, sinus pain, trouble swallowing and voice change.   Eyes: Negative for visual disturbance.  Respiratory: Positive for cough and shortness of breath. Negative for wheezing and stridor.   Cardiovascular: Negative for chest pain, palpitations and leg swelling.  Gastrointestinal: Positive for nausea. Negative for abdominal pain, anal bleeding, blood in stool, constipation, diarrhea and vomiting.  Genitourinary: Negative for dysuria, flank pain, hematuria, urgency, vaginal discharge and vaginal pain.  Musculoskeletal: Positive for back pain and myalgias. Negative for gait problem, joint swelling, neck pain and neck stiffness.  Skin: Negative for rash.  Allergic/Immunologic: Negative for immunocompromised state.  Neurological: Positive for weakness (generalized) and headaches. Negative for dizziness and syncope.  Psychiatric/Behavioral: Negative for confusion.     Physical Exam Updated Vital Signs BP 114/87 (BP Location: Right Arm)   Pulse 100   Temp 98.5 F (36.9 C) (Oral)   Resp 18   LMP 06/15/2018   SpO2 100%   Physical Exam Vitals signs and nursing note reviewed.  Constitutional:      General: She is not in acute distress.    Appearance: She is not ill-appearing or diaphoretic.     Comments: Well appearing.   HENT:     Head: Normocephalic.  Right Ear: Ear canal and external ear normal.     Left Ear: Ear canal and external ear normal.     Nose: Congestion present.     Right Sinus: No maxillary sinus tenderness or frontal sinus tenderness.     Left Sinus: No maxillary sinus tenderness or frontal sinus tenderness.     Mouth/Throat:     Lips: Pink.     Pharynx: Oropharynx is clear. Uvula midline. No pharyngeal swelling, oropharyngeal exudate, posterior oropharyngeal erythema or uvula swelling.     Tonsils: No tonsillar exudate or tonsillar abscesses.  Eyes:     General:        Right eye: No discharge.        Left eye: No  discharge.     Conjunctiva/sclera: Conjunctivae normal.  Neck:     Musculoskeletal: Normal range of motion and neck supple. No neck rigidity.  Cardiovascular:     Rate and Rhythm: Normal rate and regular rhythm.     Heart sounds: No murmur. No friction rub. No gallop.      Comments: No friction rub.  Heart is regular rate and rhythm.  No murmurs rubs or gallops. Pulmonary:     Effort: Pulmonary effort is normal. No respiratory distress.     Breath sounds: No stridor. No wheezing, rhonchi or rales.  Chest:     Chest wall: No tenderness.  Abdominal:     General: There is no distension.     Palpations: Abdomen is soft. There is no mass.     Tenderness: There is no abdominal tenderness. There is no right CVA tenderness, left CVA tenderness, guarding or rebound.     Hernia: No hernia is present.     Comments: Abdomen is soft, nontender, nondistended.  Musculoskeletal: Normal range of motion.     Comments: No focal tenderness to the bilateral lower extremities.  Lymphadenopathy:     Cervical: No cervical adenopathy.  Skin:    General: Skin is warm.     Findings: No rash.  Neurological:     Mental Status: She is alert.  Psychiatric:        Behavior: Behavior normal.      ED Treatments / Results  Labs (all labs ordered are listed, but only abnormal results are displayed) Labs Reviewed  ACETAMINOPHEN LEVEL - Abnormal; Notable for the following components:      Result Value   Acetaminophen (Tylenol), Serum <10 (*)    All other components within normal limits  CBC - Abnormal; Notable for the following components:   RBC 6.05 (*)    HCT 48.5 (*)    MCH 23.8 (*)    MCHC 29.7 (*)    All other components within normal limits  COMPREHENSIVE METABOLIC PANEL - Abnormal; Notable for the following components:   Creatinine, Ser 1.15 (*)    All other components within normal limits    EKG None  Radiology Dg Chest 2 View  Result Date: 06/26/2018 CLINICAL DATA:  Shortness of breath,  sore throat, and flu-like symptoms for 2 weeks. History of hypertension. Nonsmoker. EXAM: CHEST - 2 VIEW COMPARISON:  11/23/2017 FINDINGS: The heart size and mediastinal contours are within normal limits. Both lungs are clear. The visualized skeletal structures are unremarkable. IMPRESSION: No active cardiopulmonary disease. Electronically Signed   By: Lucienne Capers M.D.   On: 06/26/2018 23:30    Procedures Procedures (including critical care time)  Medications Ordered in ED Medications  ipratropium-albuterol (DUONEB) 0.5-2.5 (3) MG/3ML nebulizer solution 3 mL (  3 mLs Nebulization Given 06/26/18 2316)     Initial Impression / Assessment and Plan / ED Course  I have reviewed the triage vital signs and the nursing notes.  Pertinent labs & imaging results that were available during my care of the patient were reviewed by me and considered in my medical decision making (see chart for details).     33 year old female history of HSV, carpal tunnel syndrome, anemia, dysmenorrhea presenting with URI symptoms x2 weeks.  Symptoms began with sore throat and left ear pain and has since moved to her right ear and right side of her throat with nausea, shortness of breath, cough, nasal congestion, nausea, body aches, generalized weakness.  She also states she is having some pain in between her shoulder blades, but no chest pain or tightness.  Chest x-ray is unremarkable and clinically I have a low suspicion for pneumonia as she is already been treated for AOM with amoxicillin.  She is afebrile, normotensive, and without hypoxia in the ED.  She has no tachypnea.  Heart rate has been in the 90s to right at 100, which I suspect may be due to decreased oral intake over the last few days that she has been complaining of nausea.  Abdominal exam is unremarkable.  With pain in the shoulder blades, I was concerned for pneumonia; however, the patient has been taking amoxicillin for the last 6 days.  Low suspicion for  pulmonary embolism as she has no risk factors and is not hypoxic.  Doubt viral pericarditis or myocarditis.  The patient has been afebrile since arrival.  No hypoxia.  She has had no tachypnea and has been normotensive.  She was given an albuterol nebulizer treatment reports minimal improvement in shortness of breath.  She does not have any nasal flaring, retractions, or accessory muscle use.  Chest x-ray was unremarkable.  Labs are otherwise reassuring aside from mild hemoconcentration on her CBC concerning for mild dehydration.  However, she is tolerating fluids by mouth without difficulty and IV fluid administration is not indicated at this time.  She was ambulated on pulse ox and maintained an SaO2 of 93 to 99%.  She also indicated that she was taking multiple over-the-counter medications containing acetaminophen every couple of hours for the last 1 to 2 weeks.  Tylenol level is not elevated.  She was not tachypneic and was able to ambulate with minimal to no discomfort.  At this time, I suspect viral etiology as the source of her symptoms will defer flu testing at this time.  I have advised follow-up with primary care in the next few days.  She was also counseled on appropriate dosing of ibuprofen and Tylenol as well as frequency of administration.  At this time, she is hemodynamically stable and in no acute distress.  She is safe for discharge home with outpatient follow-up.  Final Clinical Impressions(s) / ED Diagnoses   Final diagnoses:  Influenza-like illness    ED Discharge Orders         Ordered    promethazine-dextromethorphan (PROMETHAZINE-DM) 6.25-15 MG/5ML syrup  4 times daily PRN     06/27/18 0040    benzonatate (TESSALON) 100 MG capsule  Every 8 hours     06/27/18 0040           McDonald, Mia A, PA-C 06/27/18 0459    Carmin Muskrat, MD 06/30/18 2333

## 2018-06-26 NOTE — ED Notes (Signed)
Patient transported to X-ray 

## 2018-06-26 NOTE — ED Notes (Signed)
emt currently drawing labs.

## 2018-06-26 NOTE — ED Triage Notes (Signed)
Onset 2 weeks left ear pain, sore throat, cough, body aches.   Pt went to PCP 06-27-2018 and doctor focused on high blood pressure.  Pt seen PCP 06-21-2018 and was dx with ear infection, was prescribed Amoxicllin.  Pts symptoms have not improved now c/o right ear pain.    Has been taking dayquil and nightquil, Ibuprofen, Robitussin, Tessalon perles

## 2018-06-27 LAB — COMPREHENSIVE METABOLIC PANEL
ALT: 25 U/L (ref 0–44)
AST: 34 U/L (ref 15–41)
Albumin: 4.2 g/dL (ref 3.5–5.0)
Alkaline Phosphatase: 71 U/L (ref 38–126)
Anion gap: 10 (ref 5–15)
BILIRUBIN TOTAL: 0.6 mg/dL (ref 0.3–1.2)
BUN: 18 mg/dL (ref 6–20)
CO2: 26 mmol/L (ref 22–32)
CREATININE: 1.15 mg/dL — AB (ref 0.44–1.00)
Calcium: 9.7 mg/dL (ref 8.9–10.3)
Chloride: 102 mmol/L (ref 98–111)
GFR calc Af Amer: >60 mL/min (ref >60)
Glucose, Bld: 99 mg/dL (ref 70–99)
Potassium: 3.5 mmol/L (ref 3.5–5.1)
Sodium: 138 mmol/L (ref 135–145)
Total Protein: 7.8 g/dL (ref 6.5–8.1)

## 2018-06-27 MED ORDER — BENZONATATE 100 MG PO CAPS: 100.0000 mg | ORAL_CAPSULE | Freq: Three times a day (TID) | ORAL | 0 refills | Status: DC

## 2018-06-27 MED ORDER — PROMETHAZINE-DM 6.25-15 MG/5ML PO SYRP: 5.0000 mL | ORAL_SOLUTION | Freq: Four times a day (QID) | ORAL | 0 refills | Status: DC | PRN

## 2018-06-27 NOTE — ED Notes (Addendum)
Pt ambulated without noted difficulty and steady gait. Pt's O2 saturation maintained above 93% throughout the duration of her walk through the hall.

## 2018-06-27 NOTE — Discharge Instructions (Addendum)
Thank you for allowing me to care for you today in the Emergency Department.   Take 5 mL's of Promethazine DM every 6 hours as needed for nasal congestion.  You can also take 1 tablet of benzonatate every 8 hours as needed for cough.  You can take these instead of taking DayQuil.  For body aches and fever, you can take 650 mg of Tylenol (acetaminophen) or 600 mg of ibuprofen with food every 6 hours. Do not take more than 4000 mg of Tylenol in a 24-hour period.  Continue to drink plenty of fluids to avoid dehydration.  Most viral illnesses significantly improve in 7 to 10 days, but some symptoms may linger for longer, such as cough which may last for 6 to 8 weeks.  If you do not start to improve in the next 3 to 4 days, follow-up with your primary care provider for recheck.  Return to the emergency department if you develop respiratory distress, chest pain, if you pass out, if you stop making urine, if you develop persistent vomiting, or other new, concerning symptoms.

## 2018-08-03 ENCOUNTER — Encounter (HOSPITAL_COMMUNITY): Payer: Self-pay | Admitting: Behavioral Health

## 2018-08-03 ENCOUNTER — Ambulatory Visit (HOSPITAL_COMMUNITY)
Admission: RE | Admit: 2018-08-03 | Discharge: 2018-08-03 | Disposition: A | Discharge status: Home / Self Care | Payer: BLUE CROSS/BLUE SHIELD | Attending: Psychiatry | Admitting: Psychiatry

## 2018-08-03 DIAGNOSIS — I10 Essential (primary) hypertension: Secondary | ICD-10-CM | POA: Diagnosis not present

## 2018-08-03 DIAGNOSIS — F320 Major depressive disorder, single episode, mild: Secondary | ICD-10-CM | POA: Insufficient documentation

## 2018-08-03 DIAGNOSIS — Z818 Family history of other mental and behavioral disorders: Secondary | ICD-10-CM | POA: Diagnosis not present

## 2018-08-03 NOTE — H&P (Signed)
Behavioral Health Medical Screening Exam  [name] is an 33 y.o. female.  Total Time spent with patient: 20 minutes  Psychiatric Specialty Exam: Physical Exam  Nursing note and vitals reviewed. Constitutional: She is oriented to person, place, and time. She appears well-developed and well-nourished.  Cardiovascular: Normal rate.  Respiratory: Effort normal.  Musculoskeletal: Normal range of motion.  Neurological: She is alert and oriented to person, place, and time.  Skin: Skin is warm.    Review of Systems  Constitutional: Negative.   HENT: Negative.   Eyes: Negative.   Respiratory: Negative.   Cardiovascular: Negative.   Gastrointestinal: Negative.   Genitourinary: Negative.   Musculoskeletal: Negative.   Skin: Negative.   Neurological: Negative.   Endo/Heme/Allergies: Negative.   Psychiatric/Behavioral: Positive for depression. Negative for hallucinations, substance abuse and suicidal ideas. The patient is nervous/anxious. The patient does not have insomnia.     Blood pressure 124/87, pulse 69, temperature 98.4 F (36.9 C), SpO2 100 %.There is no height or weight on file to calculate BMI.  General Appearance: Casual  Eye Contact:  Good  Speech:  Clear and Coherent and Normal Rate  Volume:  Normal  Mood:  Anxious  Affect:  Congruent  Thought Process:  Coherent and Descriptions of Associations: Intact  Orientation:  Full (Time, Place, and Person)  Thought Content:  WDL  Suicidal Thoughts:  No  Homicidal Thoughts:  No  Memory:  Immediate;   Good Recent;   Good Remote;   Good  Judgement:  Fair  Insight:  Fair  Psychomotor Activity:  Normal  Concentration: Concentration: Good and Attention Span: Good  Recall:  Good  Fund of Knowledge:Good  Language: Good  Akathisia:  No  Handed:  Right  AIMS (if indicated):     Assets:  Communication Skills Desire for Improvement Financial Resources/Insurance Housing Physical Health Social Support Transportation   Sleep:       Musculoskeletal: Strength & Muscle Tone: within normal limits Gait & Station: normal Patient leans: N/A  Blood pressure 124/87, pulse 69, temperature 98.4 F (36.9 C), SpO2 100 %.  Recommendations:  Based on my evaluation the patient does not appear to have an emergency medical condition.  [place], FNP 08/03/2018, 11:26 AM

## 2018-08-03 NOTE — BH Assessment (Signed)
Assessment Note  [name] is a 33 y.o. female who presented to [facility] as a voluntary walk-in (accompanied by husband) with complaint of depressive symptoms.  She came to the hospital after expressing passive suicidal ideation while at the doctor's office.  Pt was accompanied by her husband [name].  Pt has not been assessed by TTS before.  Pt reported as follows:  She stated that she is undergoing outpatient therapy to cope with stress, despondency, insomnia, and tearfulness due to conflict at home.  Pt also reported feeling isolated, as she has no family in the area.  Pt admitted making a statement at her doctor's office to the effect of ''I wish I could kill myself,'' but she denied being serious, stating that she was expressing frustration instead.  Pt denied past suicidal ideation, past suicide attempt, homicidal ideation, hallucination, and substance use concerns.  Pt also denied self-injurious behavior.  Pt said she felt safe to go home.  Pt is not receiving outpatient psychiatric care.  She receives outpatient therapy services.  During assessment, Pt presented as alert and oriented.  She had good eye contact and was cooperative.  Pt was dressed in street clothes and appeared appropriately groomed.  Pt's mood was sad.  Affect was blunted.  Pt's speech was normal in rate, rhythm, and volume.  Thought processes were within normal range, and thought content was logical and goal-oriented.  There was no evidence of delusion.  Pt's memory and concentration were intact.  Insight, judgment, and impulse control were fair.  Consulted with T. MOney, NP, who also spoke with Pt.  Pt is being discharged with outpatient resources.  Diagnosis: Major Depressive Disorder, Single Ep., Mild  Past Medical History:  Past Medical History:  Diagnosis Date  . Abnormal Pap smear 08/2003   colpo  . Anemia    History -FeSO4 SUPP IN PAST  . Carpal tunnel syndrome of right wrist   . Diastasis recti 12/12/08  . 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 GERD    zantac  . H/O chest pain 06/17/11, 06/2013   Resolved 06/2013  . H/O varicella   . Headache(784.0)    migraines;D/T BP  . Herpes    cold sores  . History of CT scan of chest    a. Cardiac CTA 10/16: no CAD  . History of echocardiogram    a. Echo 10/16: EF 50%, diff HK, mild TR, mild PI  . History of kidney stones    passed stone - no surgery required  . History of stress test    a. ETT-Echo 10/16: normal ECGs, + LAD area ischemia >> will arrange Cardiac CTA  . HTN (hypertension)   . Hyperlipidemia   . Hypertension   . Low BMI   . LV dysfunction 12/2012, 06/2013   Hx: EF 45-50%, Resolved by Jan 2015 echo- EF 50-55%  . Palpitations    Hx only - no problems  . Panic attack    HAS BEEN RX'D XANAX  . Pelvic pain    With pregnancy  . Postpartum hypertension   . Preeclampsia 2010,2011   POST PARTUM;WAS PUT ON BP MEDS  . Preterm contractions 2007   GIVEN BETAMETHASONE AND PROCARDIA TO STOP CTXS  . Right ovarian cyst 2003   RT OOPHRECTOMY  . Spotting in first trimester    With SAB  . Stress    work and at home - no meds  . SVD (spontaneous vaginal delivery)    x 5  Past Surgical History:  Procedure Laterality Date  . breast cyst removal  2005   right  . DILATION AND CURETTAGE OF UTERUS  2009  . DILITATION & CURRETTAGE/HYSTROSCOPY WITH THERMACHOICE ABLATION N/A 10/11/2013   Procedure: DILATATION & CURETTAGE/HYSTEROSCOPY WITH THERMACHOICE ABLATION;  Surgeon: Betsy Coder, MD;  Location: [facility];  Service: Gynecology;  Laterality: N/A;  . LAPAROSCOPY N/A 10/11/2013   Procedure: LAPAROSCOPY OPERATIVE WITH REMOVAL OF HYDROSALPINX ;  Surgeon: Betsy Coder, MD;  Location: [facility];  Service: Gynecology;  Laterality: N/A;  . ovary removed     right - laparotomy  . TUBAL LIGATION  07/03/2012   Procedure: POST PARTUM TUBAL LIGATION;  Surgeon: Delice Lesch, MD;  Location: [facility];  Service: Gynecology;  Laterality: Bilateral;  Bilateral post  partum tubal ligation  . WISDOM TOOTH EXTRACTION      Family History:  Family History  Problem Relation Age of Onset  . Heart disease Mother        CHF  . Hypertension Mother   . Asthma Mother        CHILDHOOD  . Diabetes Mother   . Kidney disease Mother        dialysis  . Depression Mother   . Alcohol abuse Mother   . Drug abuse Mother   . Heart failure Mother   . Hypertension Father   . Alcohol abuse Father   . Drug abuse Father   . Asthma Sister   . COPD Sister        chronic bronchitis  . Depression Sister        ATTEMPTED SUICIDE  . Arrhythmia Sister        needs ablation   . Hypertension Maternal Grandmother   . Diabetes Maternal Grandmother     Social History:  reports that she has never smoked. She has never used smokeless tobacco. She reports current alcohol use. She reports that she does not use drugs.  Additional Social History:  Alcohol / Drug Use Pain Medications: See MAR Prescriptions: See MAR Over the Counter: See MAR History of alcohol / drug use?: No history of alcohol / drug abuse  CIWA: CIWA-Ar BP: 124/87 Pulse Rate: 69 COWS:    Allergies:  Allergies  Allergen Reactions  . Hydrochlorothiazide Other (See Comments)    Patient states that it was years ago when she had a reaction to HCTZ and she can not remember what it was  . Labetalol Other (See Comments)    Alopecia   . Lisinopril Cough    cough    Home Medications: (Not in a hospital admission)   OB/GYN Status:  No LMP recorded.  General Assessment Data Location of Assessment: [facility] TTS Assessment: In system Is this a Tele or Face-to-Face Assessment?: Face-to-Face Is this an Initial Assessment or a Re-assessment for this encounter?: Initial Assessment Patient Accompanied by:: Other(Husband) Language Other than English: No Living Arrangements: Other (Comment)(Lives with husband and chilren) What gender do you identify as?: Female Marital status:  Married Pregnancy Status: No Living Arrangements: Spouse/significant other, Children Can pt return to current living arrangement?: Yes Admission Status: Voluntary Is patient capable of signing voluntary admission?: Yes Referral Source: Self/Family/Friend Insurance type: [place] Living Arrangements: Spouse/significant other, Children Name of Psychiatrist: None Name of Therapist: Michaela Corner  Education Status Is patient currently in school?: No Is the patient employed, unemployed or receiving disability?: Employed  Risk to self with the past 6 months  Suicidal Ideation: No Has patient been a risk to self within the past 6 months prior to admission? : No Suicidal Intent: No Has patient had any suicidal intent within the past 6 months prior to admission? : No Is patient at risk for suicide?: No Suicidal Plan?: No Has patient had any suicidal plan within the past 6 months prior to admission? : No Access to Means: No What has been your use of drugs/alcohol within the last 12 months?: Denied Previous Attempts/Gestures: No Intentional Self Injurious Behavior: None Family Suicide History: No Recent stressful life event(s): Conflict (Comment)(Conflict with husband) Persecutory voices/beliefs?: No Depression: Yes Depression Symptoms: Despondent, Tearfulness(sleep disturbance) Substance abuse history and/or treatment for substance abuse?: No Suicide prevention information given to non-admitted patients: Not applicable  Risk to Others within the past 6 months Homicidal Ideation: No Does patient have any lifetime risk of violence toward others beyond the six months prior to admission? : No Thoughts of Harm to Others: No Current Homicidal Intent: No Current Homicidal Plan: No Access to Homicidal Means: No History of harm to others?: No Assessment of Violence: None Noted Does patient have access to weapons?: No Criminal Charges Pending?: No Does patient have a  court date: No Is patient on probation?: No  Psychosis Hallucinations: None noted Delusions: None noted  Mental Status Report Appearance/Hygiene: Unremarkable, Other (Comment)(Street clothes) Eye Contact: Fair Motor Activity: Freedom of movement, Unremarkable Speech: Logical/coherent Level of Consciousness: Alert Mood: Sad Affect: Appropriate to circumstance, Blunted Anxiety Level: None Thought Processes: Coherent, Relevant Judgement: Partial Orientation: Person, Place, Time, Situation Obsessive Compulsive Thoughts/Behaviors: None  Cognitive Functioning Concentration: Normal Memory: Recent Intact, Remote Intact Is patient IDD: No Insight: Fair Impulse Control: Fair Appetite: Good Have you had any weight changes? : No Change Sleep: Decreased Total Hours of Sleep: (Varied) Vegetative Symptoms: None  ADLScreening [facility] Assessment Services) Patient's cognitive ability adequate to safely complete daily activities?: Yes Patient able to express need for assistance with ADLs?: Yes Independently performs ADLs?: Yes (appropriate for developmental age)  Prior Inpatient Therapy Prior Inpatient Therapy: No  Prior Outpatient Therapy Prior Outpatient Therapy: Yes Prior Therapy Dates: Ongoing Prior Therapy Facilty/Provider(s): Private provider -- Michaela Corner Reason for Treatment: Depression, anxiety Does patient have an ACCT team?: No Does patient have Intensive In-House Services?  : No Does patient have [facility] services? : No Does patient have P4CC services?: No  ADL Screening (condition at time of admission) Patient's cognitive ability adequate to safely complete daily activities?: Yes Is the patient deaf or have difficulty hearing?: No Does the patient have difficulty seeing, even when wearing glasses/contacts?: No Does the patient have difficulty concentrating, remembering, or making decisions?: No Patient able to express need for assistance with ADLs?: Yes Does the  patient have difficulty dressing or bathing?: No Independently performs ADLs?: Yes (appropriate for developmental age) Does the patient have difficulty walking or climbing stairs?: No Weakness of Legs: None Weakness of Arms/Hands: None  Home Assistive Devices/Equipment Home Assistive Devices/Equipment: None  Therapy Consults (therapy consults require a physician order) PT Evaluation Needed: No OT Evalulation Needed: No SLP Evaluation Needed: No Abuse/Neglect Assessment (Assessment to be complete while patient is alone) Abuse/Neglect Assessment Can Be Completed: Yes Physical Abuse: Denies Verbal Abuse: Denies Sexual Abuse: Denies Exploitation of patient/patient's resources: Denies Self-Neglect: Denies Values / Beliefs Cultural Requests During Hospitalization: None Spiritual Requests During Hospitalization: None Consults Spiritual Care Consult Needed: No Social Work Consult Needed: No Regulatory affairs officer (For Healthcare) Does Patient Have a Medical Advance Directive?: No  Disposition:  Disposition Initial Assessment Completed for this Encounter: Yes Disposition of Patient: Discharge(Per T. Money, NP, Pt does not meet inpt criteria)  On Site Evaluation by:   Reviewed with Physician:    Laurena Slimmer Mccayla Shimada 08/03/2018 11:31 AM

## 2019-02-22 ENCOUNTER — Other Ambulatory Visit: Payer: Self-pay

## 2019-02-22 DIAGNOSIS — Z20822 Contact with and (suspected) exposure to covid-19: Secondary | ICD-10-CM

## 2019-02-23 ENCOUNTER — Emergency Department (HOSPITAL_COMMUNITY): Payer: BC Managed Care – PPO

## 2019-02-23 ENCOUNTER — Other Ambulatory Visit: Payer: Self-pay

## 2019-02-23 ENCOUNTER — Emergency Department (HOSPITAL_COMMUNITY)
Admission: EM | Admit: 2019-02-23 | Discharge: 2019-02-23 | Disposition: A | Discharge status: Home / Self Care | Payer: BC Managed Care – PPO | Attending: Emergency Medicine | Admitting: Emergency Medicine

## 2019-02-23 ENCOUNTER — Encounter (HOSPITAL_COMMUNITY): Payer: Self-pay

## 2019-02-23 DIAGNOSIS — R531 Weakness: Secondary | ICD-10-CM | POA: Insufficient documentation

## 2019-02-23 DIAGNOSIS — R112 Nausea with vomiting, unspecified: Secondary | ICD-10-CM | POA: Diagnosis present

## 2019-02-23 DIAGNOSIS — F17210 Nicotine dependence, cigarettes, uncomplicated: Secondary | ICD-10-CM | POA: Diagnosis not present

## 2019-02-23 DIAGNOSIS — Z79891 Long term (current) use of opiate analgesic: Secondary | ICD-10-CM | POA: Diagnosis not present

## 2019-02-23 DIAGNOSIS — I10 Essential (primary) hypertension: Secondary | ICD-10-CM | POA: Insufficient documentation

## 2019-02-23 DIAGNOSIS — E876 Hypokalemia: Secondary | ICD-10-CM | POA: Diagnosis not present

## 2019-02-23 LAB — BASIC METABOLIC PANEL
Anion gap: 12 (ref 5–15)
BUN: 16 mg/dL (ref 6–20)
CO2: 25 mmol/L (ref 22–32)
Calcium: 10.3 mg/dL (ref 8.9–10.3)
Chloride: 102 mmol/L (ref 98–111)
Creatinine, Ser: 0.96 mg/dL (ref 0.44–1.00)
GFR calc Af Amer: >60 mL/min (ref >60)
GFR calc non Af Amer: >60 mL/min (ref >60)
Glucose, Bld: 99 mg/dL (ref 70–99)
Potassium: 3 mmol/L — ABNORMAL LOW (ref 3.5–5.1)
Sodium: 139 mmol/L (ref 135–145)

## 2019-02-23 LAB — CBC WITH DIFFERENTIAL/PLATELET
Abs Immature Granulocytes: 0.02 10*3/uL (ref 0.00–0.07)
Basophils Absolute: 0.1 10*3/uL (ref 0.0–0.1)
Basophils Relative: 1 %
Eosinophils Absolute: 0.2 10*3/uL (ref 0.0–0.5)
Eosinophils Relative: 2 %
HCT: 46.2 % — ABNORMAL HIGH (ref 36.0–46.0)
Hemoglobin: 14.7 g/dL (ref 12.0–15.0)
Immature Granulocytes: 0 %
Lymphocytes Relative: 20 %
Lymphs Abs: 1.9 10*3/uL (ref 0.7–4.0)
MCH: 25.4 pg — ABNORMAL LOW (ref 26.0–34.0)
MCHC: 31.8 g/dL (ref 30.0–36.0)
MCV: 79.9 fL — ABNORMAL LOW (ref 80.0–100.0)
Monocytes Absolute: 0.8 10*3/uL (ref 0.1–1.0)
Monocytes Relative: 8 %
Neutro Abs: 6.6 10*3/uL (ref 1.7–7.7)
Neutrophils Relative %: 69 %
Platelets: 269 10*3/uL (ref 150–400)
RBC: 5.78 MIL/uL — ABNORMAL HIGH (ref 3.87–5.11)
RDW: 14 % (ref 11.5–15.5)
WBC: 9.5 10*3/uL (ref 4.0–10.5)
nRBC: 0 % (ref 0.0–0.2)

## 2019-02-23 LAB — I-STAT BETA HCG BLOOD, ED (MC, WL, AP ONLY): I-stat hCG, quantitative: <5 m[IU]/mL (ref <5)

## 2019-02-23 MED ORDER — POTASSIUM CHLORIDE CRYS ER 20 MEQ PO TBCR
40.0000 meq | EXTENDED_RELEASE_TABLET | Freq: Once | ORAL | Status: AC
  Administered 2019-02-23: 19:00:00 40 meq via ORAL
  Filled 2019-02-23: qty 2

## 2019-02-23 NOTE — ED Triage Notes (Addendum)
Patient reports that she has been having low grade temps, chills, tingling and weakness of the left arm and left leg, emesis 2-3 days ago, but now just nausea. Patient states she has had a cough this week as well.  Patient states the tingling in her left arm and left leg woke her up and states she does not know if she was laying on that side or not. Patient also reports that she fell down 7 steps approx 2 weeks ago. Patient states she went to an orthopedic UC

## 2019-02-23 NOTE — ED Provider Notes (Signed)
[facility] DEPT Provider Note   CSN: [id number] Arrival date & time: 02/23/19  1610     History   Chief Complaint Chief Complaint  Patient presents with  . Emesis  . Fever  . Tingling  . Weakness    HPI [name] is a 33 y.o. female.     33 year old female with prior medical history as detailed below presents for evaluation of multiple complaints.  Patient reports malaise, fatigue, night sweats, and weakness.  The symptoms started approximately 5 days prior.  Patient reports having obtained an outpatient COVID screening test yesterday.  She does not yet have results on this test.  She denies fever.  She denies headache or visual change.  She denies focal weakness.  She denies known exposure to COVID positive patient.  The history is provided by the patient and medical records.  Illness Location:  Malaise, fatigue, night sweats, diffuse weakness Severity:  Mild Onset quality:  Gradual Duration:  4 days Timing:  Constant Progression:  Waxing and waning Chronicity:  New   Past Medical History:  Diagnosis Date  . Abnormal Pap smear 08/2003   colpo  . Anemia    History -FeSO4 SUPP IN PAST  . Carpal tunnel syndrome of right wrist   . Diastasis recti 12/12/08  . 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 GERD    zantac  . H/O chest pain 06/17/11, 06/2013   Resolved 06/2013  . H/O varicella   . Headache(784.0)    migraines;D/T BP  . Herpes    cold sores  . History of CT scan of chest    a. Cardiac CTA 10/16: no CAD  . History of echocardiogram    a. Echo 10/16: EF 50%, diff HK, mild TR, mild PI  . History of kidney stones    passed stone - no surgery required  . History of stress test    a. ETT-Echo 10/16: normal ECGs, + LAD area ischemia >> will arrange Cardiac CTA  . HTN (hypertension)   . Hyperlipidemia   . Hypertension   . Low BMI   . LV dysfunction 12/2012, 06/2013   Hx: EF 45-50%, Resolved by Jan 2015 echo- EF 50-55%  .  Palpitations    Hx only - no problems  . Panic attack    HAS BEEN RX'D XANAX  . Pelvic pain    With pregnancy  . Postpartum hypertension   . Preeclampsia 2010,2011   POST PARTUM;WAS PUT ON BP MEDS  . Preterm contractions 2007   GIVEN BETAMETHASONE AND PROCARDIA TO STOP CTXS  . Right ovarian cyst 2003   RT OOPHRECTOMY  . Spotting in first trimester    With SAB  . Stress    work and at home - no meds  . SVD (spontaneous vaginal delivery)    x 5    Patient Active Problem List   Diagnosis Date Noted  . Fever 10/19/2013  . Fever of unknown origin 10/18/2013  . Anxiety 06/28/2013  . Hypokalemia 04/28/2013  . Chest pain at rest 01/06/2013  . LV dysfunction, by Echo EF 45-50%- resolved by Echo Jan 2015 01/06/2013  . Tachycardia 01/06/2013  . Musculoskeletal leg pain 03/31/2012  . Herpes   . H/O varicella   . Pelvic pain   . Panic attack   . Right ovarian cyst   . Anemia   . GERD   . Hypertension 11/13/2011  . H/O chest pain 06/17/2011  . Diastasis  recti 12/12/2008  . Abnormal Pap smear 08/14/2003    Past Surgical History:  Procedure Laterality Date  . breast cyst removal  2005   right  . DILATION AND CURETTAGE OF UTERUS  2009  . DILITATION & CURRETTAGE/HYSTROSCOPY WITH THERMACHOICE ABLATION N/A 10/11/2013   Procedure: DILATATION & CURETTAGE/HYSTEROSCOPY WITH THERMACHOICE ABLATION;  Surgeon: Betsy Coder, MD;  Location: [facility];  Service: Gynecology;  Laterality: N/A;  . LAPAROSCOPY N/A 10/11/2013   Procedure: LAPAROSCOPY OPERATIVE WITH REMOVAL OF HYDROSALPINX ;  Surgeon: Betsy Coder, MD;  Location: [facility];  Service: Gynecology;  Laterality: N/A;  . ovary removed     right - laparotomy  . TUBAL LIGATION  07/03/2012   Procedure: POST PARTUM TUBAL LIGATION;  Surgeon: Delice Lesch, MD;  Location: [facility];  Service: Gynecology;  Laterality: Bilateral;  Bilateral post partum tubal ligation  . WISDOM TOOTH EXTRACTION       OB History    Gravida  6   Para  5    Term  5   Preterm      AB  1   Living  5     SAB  1   TAB      Ectopic      Multiple      Live Births  5            Home Medications    Prior to Admission medications   Medication Sig Start Date End Date Taking? Authorizing Provider  acetaminophen (TYLENOL) 500 MG tablet Take 500 mg by mouth every 6 (six) hours as needed for moderate pain.   Yes [provider]  Chlorpheniramine-Pseudoeph 4-60 MG TABS Take 2 tablets by mouth daily as needed (congestion).   Yes [provider]  chlorthalidone (HYGROTON) 25 MG tablet Take 25 mg by mouth daily.  12/12/18  Yes [provider]  dicyclomine (BENTYL) 20 MG tablet Take 20 mg by mouth 3 (three) times daily as needed for spasms.  09/20/18  Yes [provider]  losartan (COZAAR) 50 MG tablet Take 100 mg by mouth daily. 12/19/18  Yes [provider]  omeprazole (PRILOSEC) 40 MG capsule Take 40 mg by mouth 2 (two) times daily.  09/13/18  Yes [provider]  Phenylephrine-DM-GG-APAP (TYLENOL COLD/FLU SEVERE) 5-10-200-325 MG TABS Take 2 tablets by mouth daily as needed (congestion and indigestion).   Yes [provider]  promethazine (PHENERGAN) 25 MG tablet Take 25 mg by mouth every 6 (six) hours as needed for nausea or vomiting.   Yes [provider]  promethazine-dextromethorphan (PROMETHAZINE-DM) 6.25-15 MG/5ML syrup Take 5 mLs by mouth 4 (four) times daily as needed for cough. 06/27/18  Yes McDonald, Mia A, PA-C  propranolol ER (INDERAL LA) 80 MG 24 hr capsule Take 80 mg by mouth daily.   Yes [provider]  simethicone (MYLICON) 0000000 MG chewable tablet Chew 125 mg by mouth every 6 (six) hours as needed for flatulence.   Yes [provider]  benzonatate (TESSALON) 100 MG capsule Take 1 capsule (100 mg total) by mouth every 8 (eight) hours. Patient not taking: Reported on 02/23/2019 06/27/18   McDonald, Mia A, PA-C  sucralfate (CARAFATE) 1 g tablet Take 1  tablet (1 g total) by mouth 4 (four) times daily -  with meals and at bedtime. Patient not taking: Reported on 02/23/2019 12/05/17   Molpus, Jenny Reichmann, MD    Family History Family History  Problem Relation Age of Onset  . Heart disease  Mother        CHF  . Hypertension Mother   . Asthma Mother        CHILDHOOD  . Diabetes Mother   . Kidney disease Mother        dialysis  . Depression Mother   . Alcohol abuse Mother   . Drug abuse Mother   . Heart failure Mother   . Hypertension Father   . Alcohol abuse Father   . Drug abuse Father   . Asthma Sister   . COPD Sister        chronic bronchitis  . Depression Sister        ATTEMPTED SUICIDE  . Arrhythmia Sister        needs ablation   . Hypertension Maternal Grandmother   . Diabetes Maternal Grandmother     Social History Social History   Tobacco Use  . Smoking status: Current Every Day Smoker    Packs/day: 0.15    Types: Cigarettes  . Smokeless tobacco: Never Used  Substance Use Topics  . Alcohol use: Yes    Comment: rarely  . Drug use: No     Allergies   Hydrochlorothiazide, Labetalol, and Lisinopril   Review of Systems Review of Systems  All other systems reviewed and are negative.    Physical Exam Updated Vital Signs BP 103/77   Pulse 84   Temp 98.5 F (36.9 C) (Oral)   Resp 15   Ht 5\' 5"  (1.651 m)   Wt 74.8 kg   LMP 01/28/2019 (Approximate)   SpO2 100%   BMI 27.46 kg/m   Physical Exam Vitals signs and nursing note reviewed.  Constitutional:      General: She is not in acute distress.    Appearance: Normal appearance. She is well-developed.  HENT:     Head: Normocephalic and atraumatic.  Eyes:     Conjunctiva/sclera: Conjunctivae normal.     Pupils: Pupils are equal, round, and reactive to light.  Neck:     Musculoskeletal: Normal range of motion and neck supple.  Cardiovascular:     Rate and Rhythm: Normal rate and regular rhythm.     Heart sounds: Normal heart sounds.  Pulmonary:      Effort: Pulmonary effort is normal. No respiratory distress.     Breath sounds: Normal breath sounds.  Abdominal:     General: There is no distension.     Palpations: Abdomen is soft.     Tenderness: There is no abdominal tenderness.  Musculoskeletal: Normal range of motion.        General: No deformity.  Skin:    General: Skin is warm and dry.  Neurological:     General: No focal deficit present.     Mental Status: She is alert and oriented to person, place, and time. Mental status is at baseline.     Cranial Nerves: No cranial nerve deficit.     Sensory: No sensory deficit.     Motor: No weakness.     Coordination: Coordination normal.     Gait: Gait normal.      ED Treatments / Results  Labs (all labs ordered are listed, but only abnormal results are displayed) Labs Reviewed  BASIC METABOLIC PANEL - Abnormal; Notable for the following components:      Result Value   Potassium 3.0 (*)    All other components within normal limits  CBC WITH DIFFERENTIAL/PLATELET - Abnormal; Notable for the following components:   RBC 5.78 (*)  HCT 46.2 (*)    MCV 79.9 (*)    MCH 25.4 (*)    All other components within normal limits  I-STAT BETA HCG BLOOD, ED ([facility], [facility], [facility] ONLY)    EKG EKG Interpretation  Date/Time:  Thursday February 23 2019 16:27:03 EDT Ventricular Rate:  92 PR Interval:  134 QRS Duration: 82 QT Interval:  356 QTC Calculation: 440 R Axis:   80 Text Interpretation:  Normal sinus rhythm with sinus arrhythmia Right atrial enlargement Borderline ECG Confirmed by Dene Gentry [phone]) on 02/23/2019 5:40:25 PM   Radiology Dg Chest 2 View  Result Date: 02/23/2019 CLINICAL DATA:  Chest pain EXAM: CHEST - 2 VIEW COMPARISON:  06/26/2018 FINDINGS: The heart size and mediastinal contours are within normal limits. Both lungs are clear. The visualized skeletal structures are unremarkable. IMPRESSION: No acute abnormality of the lungs. Electronically Signed   By: Eddie Candle M.D.   On: 02/23/2019 18:25   Ct Head Wo Contrast  Result Date: 02/23/2019 CLINICAL DATA:  Left side tingling EXAM: CT HEAD WITHOUT CONTRAST TECHNIQUE: Contiguous axial images were obtained from the base of the skull through the vertex without intravenous contrast. COMPARISON:  07/18/2011 FINDINGS: Brain: No acute intracranial abnormality. Specifically, no hemorrhage, hydrocephalus, mass lesion, acute infarction, or significant intracranial injury. Vascular: No hyperdense vessel or unexpected calcification. Skull: No acute calvarial abnormality. Sinuses/Orbits: Visualized paranasal sinuses and mastoids clear. Orbital soft tissues unremarkable. Other: None IMPRESSION: Normal study. Electronically Signed   By: Rolm Baptise M.D.   On: 02/23/2019 19:27    Procedures Procedures (including critical care time)  Medications Ordered in ED Medications  potassium chloride SA (K-DUR) CR tablet 40 mEq (has no administration in time range)     Initial Impression / Assessment and Plan / ED Course  I have reviewed the triage vital signs and the nursing notes.  Pertinent labs & imaging results that were available during my care of the patient were reviewed by me and considered in my medical decision making (see chart for details).        MDM  Screen complete  [name] was evaluated in Emergency Department on 02/23/2019 for the symptoms described in the history of present illness. She was evaluated in the context of the global COVID-19 pandemic, which necessitated consideration that the patient might be at risk for infection with the SARS-CoV-2 virus that causes COVID-19. Institutional protocols and algorithms that pertain to the evaluation of patients at risk for COVID-19 are in a state of rapid change based on information released by regulatory bodies including the CDC and federal and state organizations. These policies and algorithms were followed during the patient's care in the ED.   Patient is presenting for evaluation of multiple complaints.  Exam does not suggest significant acute pathology.  Screening labs revealed mild hypokalemia without evidence of other significant abnormality.  Following her ED evaluation feels improved.  She now desires discharge home.  Importance of close follow-up is stressed.  Strict return precautions given and understood.   Final Clinical Impressions(s) / ED Diagnoses   Final diagnoses:  Hypokalemia    ED Discharge Orders    None       Valarie Merino, MD 02/23/19 2009

## 2019-02-23 NOTE — Discharge Instructions (Signed)
Please return for any problem.  Follow-up with your regular care provider as instructed.  You should obtain a repeat check of your potassium levels within the next 1 to 2 weeks.

## 2019-02-24 LAB — NOVEL CORONAVIRUS, NAA: SARS-CoV-2, NAA: NOT DETECTED

## 2019-09-17 ENCOUNTER — Encounter (HOSPITAL_COMMUNITY): Payer: Self-pay | Admitting: Psychiatry

## 2019-09-17 ENCOUNTER — Ambulatory Visit (HOSPITAL_COMMUNITY)
Admission: AD | Admit: 2019-09-17 | Discharge: 2019-09-17 | Disposition: A | Discharge status: Home / Self Care | Payer: BC Managed Care – PPO | Attending: Psychiatry | Admitting: Psychiatry

## 2019-09-17 DIAGNOSIS — F4323 Adjustment disorder with mixed anxiety and depressed mood: Secondary | ICD-10-CM

## 2019-09-17 DIAGNOSIS — F339 Major depressive disorder, recurrent, unspecified: Secondary | ICD-10-CM | POA: Insufficient documentation

## 2019-09-17 DIAGNOSIS — R45851 Suicidal ideations: Secondary | ICD-10-CM | POA: Insufficient documentation

## 2019-09-17 DIAGNOSIS — F332 Major depressive disorder, recurrent severe without psychotic features: Secondary | ICD-10-CM

## 2019-09-17 DIAGNOSIS — F329 Major depressive disorder, single episode, unspecified: Secondary | ICD-10-CM | POA: Insufficient documentation
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
## 2020-05-29 ENCOUNTER — Ambulatory Visit (INDEPENDENT_AMBULATORY_CARE_PROVIDER_SITE_OTHER): Payer: BC Managed Care – PPO

## 2020-05-29 ENCOUNTER — Encounter (HOSPITAL_COMMUNITY): Payer: Self-pay

## 2020-05-29 ENCOUNTER — Ambulatory Visit (HOSPITAL_COMMUNITY)
Admission: EM | Admit: 2020-05-29 | Discharge: 2020-05-29 | Disposition: A | Discharge status: Home / Self Care | Payer: BC Managed Care – PPO | Attending: Family Medicine | Admitting: Family Medicine

## 2020-05-29 ENCOUNTER — Ambulatory Visit (HOSPITAL_COMMUNITY): Admit: 2020-05-29 | Discharge status: Against Medical Advice | Payer: Self-pay

## 2020-05-29 DIAGNOSIS — R059 Cough, unspecified: Secondary | ICD-10-CM | POA: Diagnosis not present

## 2020-05-29 DIAGNOSIS — M546 Pain in thoracic spine: Secondary | ICD-10-CM | POA: Diagnosis not present

## 2020-05-29 DIAGNOSIS — M549 Dorsalgia, unspecified: Secondary | ICD-10-CM

## 2020-05-29 NOTE — ED Triage Notes (Signed)
Pt states she was diagnosed with COVID in November.  Pt presents with upper back pain and sharp pain in back when coughing.

## 2020-06-01 NOTE — ED Provider Notes (Signed)
[place]   [id number] 05/29/20 Arrival Time: 5364  ASSESSMENT & PLAN:  1. Acute midline thoracic back pain     I have personally viewed the imaging studies ordered this visit. Normal.   Able to ambulate here and hemodynamically stable. Prefers OTC analgesics. Encourage ROM/movement as tolerated.  Recommend:  Follow-up Information    [place].   Why: If worsening or failing to improve as anticipated. Contact information: [address] [place] [phone]              Reviewed expectations re: course of current medical issues. Questions answered. Outlined signs and symptoms indicating need for more acute intervention. Patient verbalized understanding. After Visit Summary given.   SUBJECTIVE: History from: patient.  [name] is a 34 y.o. female who presents with complaint of intermittent bilateral mid back discomfort. Onset gradual. First noted past few days. Injury/trama: no. History of back problems requiring medical care: no. Pain described as sharp and without radiation. Feels when coughing; no SOB. Progressive LE weakness or saddle anesthesia: none. Extremity sensation changes or weakness: none. Ambulatory without difficulty. Normal bowel/bladder habits: yes; without urinary retention. Normal PO intake without n/v. No associated abdominal pain/n/v. Self treatment: has has not tried OTC therapies.  Reports no chronic steroid use, fevers, IV drug use, or recent back surgeries or procedures.    OBJECTIVE:  Vitals:   05/29/20 1923  BP: (!) 142/94  Pulse: 84  Resp: 16  Temp: 98.4 F (36.9 C)  SpO2: 98%    General appearance: alert; no distress HEENT: [place]; AT Neck: supple with FROM; without midline tenderness CV: regular Lungs: unlabored respirations; speaks full sentences without difficulty Abdomen: soft, non-tender; non-distended Back: poorly localized tenderness to  palpation over thoracic mid back; FROM at waist; bruising: none; without midline tenderness Extremities: without edema; symmetrical without gross deformities; normal ROM of bilateral LE Skin: warm and dry Neurologic: normal gait; normal sensation and strength of bilateral LE Psychological: alert and cooperative; normal mood and affect   Imaging: DG Chest 2 View  Result Date: 05/29/2020 CLINICAL DATA:  Sharp pain in back with cough EXAM: CHEST - 2 VIEW COMPARISON:  02/23/2019 FINDINGS: The heart size and mediastinal contours are within normal limits. Both lungs are clear. The visualized skeletal structures are unremarkable. IMPRESSION: Normal study. Electronically Signed   By: Rolm Baptise M.D.   On: 05/29/2020 20:13    Allergies  Allergen Reactions  . Hydrochlorothiazide Other (See Comments)    Patient states that it was years ago when she had a reaction to HCTZ and she can not remember what it was  . Labetalol Other (See Comments)    Alopecia   . Lisinopril Cough    cough    Past Medical History:  Diagnosis Date  . Abnormal Pap smear 08/2003   colpo  . Anemia    History -FeSO4 SUPP IN PAST  . Carpal tunnel syndrome of right wrist   . Diastasis recti 12/12/08  . 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 GERD    zantac  . H/O chest pain 06/17/11, 06/2013   Resolved 06/2013  . H/O varicella   . Headache(784.0)    migraines;D/T BP  . Herpes    cold sores  . History of CT scan of chest    a. Cardiac CTA 10/16: no CAD  . History of echocardiogram    a. Echo 10/16: EF 50%, diff HK, mild  TR, mild PI  . History of kidney stones    passed stone - no surgery required  . History of stress test    a. ETT-Echo 10/16: normal ECGs, + LAD area ischemia >> will arrange Cardiac CTA  . HTN (hypertension)   . Hyperlipidemia   . Hypertension   . Low BMI   . LV dysfunction 12/2012, 06/2013   Hx: EF 45-50%, Resolved by Jan 2015 echo- EF 50-55%  . Palpitations    Hx only - no problems  . Panic  attack    HAS BEEN RX'D XANAX  . Pelvic pain    With pregnancy  . Postpartum hypertension   . Preeclampsia 2010,2011   POST PARTUM;WAS PUT ON BP MEDS  . Preterm contractions 2007   GIVEN BETAMETHASONE AND PROCARDIA TO STOP CTXS  . Right ovarian cyst 2003   RT OOPHRECTOMY  . Spotting in first trimester    With SAB  . Stress    work and at home - no meds  . SVD (spontaneous vaginal delivery)    x 5   Social History   Socioeconomic History  . Marital status: Married    Spouse name: Taurus  . Number of children: 5  . Years of education: 15  . Highest education level: Not on file  Occupational History  . Occupation: STUDENT  . Occupation: Company secretary: [employer]  Tobacco Use  . Smoking status: Current Every Day Smoker    Packs/day: 0.15    Types: Cigarettes  . Smokeless tobacco: Never Used  Vaping Use  . Vaping Use: Never used  Substance and Sexual Activity  . Alcohol use: Not Currently  . Drug use: Yes    Types: Marijuana    Comment: Episodic use  . Sexual activity: Yes    Partners: Male    Birth control/protection: Surgical    Comment: tubal  Other Topics Concern  . Not on file  Social History Narrative   Pt lives in [place] with husband and children.  She is followed by Dr. Toy Care   Social Determinants of Health   Financial Resource Strain: Not on file  Food Insecurity: Not on file  Transportation Needs: Not on file  Physical Activity: Not on file  Stress: Not on file  Social Connections: Not on file  Intimate Partner Violence: Not on file   Family History  Problem Relation Age of Onset  . Heart disease Mother        CHF  . Hypertension Mother   . Asthma Mother        CHILDHOOD  . Diabetes Mother   . Kidney disease Mother        dialysis  . Depression Mother   . Alcohol abuse Mother   . Drug abuse Mother   . Heart failure Mother   . Hypertension Father   . Alcohol abuse Father   . Drug abuse Father   . Asthma Sister    . COPD Sister        chronic bronchitis  . Depression Sister        ATTEMPTED SUICIDE  . Arrhythmia Sister        needs ablation   . Hypertension Maternal Grandmother   . Diabetes Maternal Grandmother    Past Surgical History:  Procedure Laterality Date  . breast cyst removal  2005   right  . DILATION AND CURETTAGE OF UTERUS  2009  . DILITATION & CURRETTAGE/HYSTROSCOPY WITH THERMACHOICE ABLATION N/A 10/11/2013  Procedure: DILATATION & CURETTAGE/HYSTEROSCOPY WITH THERMACHOICE ABLATION;  Surgeon: Betsy Coder, MD;  Location: [facility];  Service: Gynecology;  Laterality: N/A;  . LAPAROSCOPY N/A 10/11/2013   Procedure: LAPAROSCOPY OPERATIVE WITH REMOVAL OF HYDROSALPINX ;  Surgeon: Betsy Coder, MD;  Location: [facility];  Service: Gynecology;  Laterality: N/A;  . ovary removed     right - laparotomy  . TUBAL LIGATION  07/03/2012   Procedure: POST PARTUM TUBAL LIGATION;  Surgeon: Delice Lesch, MD;  Location: [facility];  Service: Gynecology;  Laterality: Bilateral;  Bilateral post partum tubal ligation  . WISDOM TOOTH EXTRACTION       Vanessa Kick, MD 06/01/20 [phone]

## 2021-04-15 ENCOUNTER — Ambulatory Visit: Payer: Self-pay

## 2021-04-20 ENCOUNTER — Other Ambulatory Visit: Payer: Self-pay

## 2021-04-20 ENCOUNTER — Encounter: Payer: Self-pay | Admitting: Emergency Medicine

## 2021-04-20 ENCOUNTER — Emergency Department: Payer: BC Managed Care – PPO

## 2021-04-20 ENCOUNTER — Emergency Department
Admission: EM | Admit: 2021-04-20 | Discharge: 2021-04-21 | Disposition: A | Discharge status: Home / Self Care | Payer: BC Managed Care – PPO | Attending: Emergency Medicine | Admitting: Emergency Medicine

## 2021-04-20 DIAGNOSIS — F17210 Nicotine dependence, cigarettes, uncomplicated: Secondary | ICD-10-CM | POA: Insufficient documentation

## 2021-04-20 DIAGNOSIS — R109 Unspecified abdominal pain: Secondary | ICD-10-CM | POA: Diagnosis present

## 2021-04-20 DIAGNOSIS — N83202 Unspecified ovarian cyst, left side: Secondary | ICD-10-CM | POA: Insufficient documentation

## 2021-04-20 DIAGNOSIS — Z79899 Other long term (current) drug therapy: Secondary | ICD-10-CM | POA: Insufficient documentation

## 2021-04-20 DIAGNOSIS — I10 Essential (primary) hypertension: Secondary | ICD-10-CM | POA: Insufficient documentation

## 2021-04-20 DIAGNOSIS — R1032 Left lower quadrant pain: Secondary | ICD-10-CM

## 2021-04-20 LAB — COMPREHENSIVE METABOLIC PANEL
ALT: 9 U/L (ref 0–44)
AST: 14 U/L — ABNORMAL LOW (ref 15–41)
Albumin: 3.7 g/dL (ref 3.5–5.0)
Alkaline Phosphatase: 65 U/L (ref 38–126)
Anion gap: 9 (ref 5–15)
BUN: 13 mg/dL (ref 6–20)
CO2: 24 mmol/L (ref 22–32)
Calcium: 9 mg/dL (ref 8.9–10.3)
Chloride: 105 mmol/L (ref 98–111)
Creatinine, Ser: 0.74 mg/dL (ref 0.44–1.00)
GFR, Estimated: >60 mL/min (ref >60)
Glucose, Bld: 90 mg/dL (ref 70–99)
Potassium: 3.5 mmol/L (ref 3.5–5.1)
Sodium: 138 mmol/L (ref 135–145)
Total Bilirubin: 0.5 mg/dL (ref 0.3–1.2)
Total Protein: 7.1 g/dL (ref 6.5–8.1)

## 2021-04-20 LAB — CBC
HCT: 37.3 % (ref 36.0–46.0)
Hemoglobin: 12 g/dL (ref 12.0–15.0)
MCH: 25.6 pg — ABNORMAL LOW (ref 26.0–34.0)
MCHC: 32.2 g/dL (ref 30.0–36.0)
MCV: 79.7 fL — ABNORMAL LOW (ref 80.0–100.0)
Platelets: 298 10*3/uL (ref 150–400)
RBC: 4.68 MIL/uL (ref 3.87–5.11)
RDW: 12.7 % (ref 11.5–15.5)
WBC: 8.7 10*3/uL (ref 4.0–10.5)
nRBC: 0 % (ref 0.0–0.2)

## 2021-04-20 LAB — LIPASE, BLOOD: Lipase: 34 U/L (ref 11–51)

## 2021-04-20 NOTE — ED Triage Notes (Signed)
Pt to ED via POV with c/o LLQ pain that goes into her hip, she had an ovarian cyst on her right side and had to have her ovary removed. She feels like the same pain that she had with her cyst then. She reports that she cant stand up that it hurts so much.

## 2021-04-21 LAB — POC URINE PREG, ED: Preg Test, Ur: NEGATIVE

## 2021-04-21 LAB — URINALYSIS, ROUTINE W REFLEX MICROSCOPIC
Bilirubin Urine: NEGATIVE
Glucose, UA: NEGATIVE mg/dL
Hgb urine dipstick: NEGATIVE
Ketones, ur: 5 mg/dL — AB
Leukocytes,Ua: NEGATIVE
Nitrite: NEGATIVE
Protein, ur: NEGATIVE mg/dL
Specific Gravity, Urine: 1.028 (ref 1.005–1.030)
pH: 5 (ref 5.0–8.0)

## 2021-04-21 LAB — PREGNANCY, URINE: Preg Test, Ur: NEGATIVE

## 2021-04-21 MED ORDER — ONDANSETRON 4 MG PO TBDP: 4.0000 mg | ORAL_TABLET | Freq: Four times a day (QID) | ORAL | 0 refills | Status: DC | PRN

## 2021-04-21 MED ORDER — DOCUSATE SODIUM 100 MG PO CAPS: 100.0000 mg | ORAL_CAPSULE | Freq: Two times a day (BID) | ORAL | 2 refills | Status: AC

## 2021-04-21 MED ORDER — OXYCODONE-ACETAMINOPHEN 5-325 MG PO TABS: 2.0000 | ORAL_TABLET | Freq: Four times a day (QID) | ORAL | 0 refills | Status: DC | PRN

## 2021-04-21 MED ORDER — IBUPROFEN 800 MG PO TABS: 800.0000 mg | ORAL_TABLET | Freq: Three times a day (TID) | ORAL | 0 refills | Status: DC | PRN

## 2021-04-21 NOTE — ED Provider Notes (Signed)
[facility] Emergency Department Provider Note  ____________________________________________   Event Date/Time   First MD Initiated Contact with Patient 04/21/21 0023     (approximate)  I have reviewed the triage vital signs and the nursing notes.   HISTORY  Chief Complaint Abdominal Pain    HPI [name] is a 35 y.o. female with history of migraines, anemia, GERD, hypertension, hyperlipidemia, right oophorectomy after previous ovarian cyst who presents to the emergency department with severe sharp left pelvic pain that started today.  States she will have episodes where it will cause her to double over in pain and began to cry.  No vomiting, diarrhea.  No dysuria, hematuria, vaginal bleeding or discharge.  No aggravating or alleviating factors.  No radiation of pain.        Past Medical History:  Diagnosis Date   Abnormal Pap smear 08/2003   colpo   Anemia    History -FeSO4 SUPP IN PAST   Carpal tunnel syndrome of right wrist    Diastasis recti 12/12/08   GBS carrier    First pregnancy   GERD    zantac   H/O chest pain 06/17/11, 06/2013   Resolved 06/2013   H/O varicella    Headache(784.0)    migraines;D/T BP   Herpes    cold sores   History of CT scan of chest    a. Cardiac CTA 10/16: no CAD   History of echocardiogram    a. Echo 10/16: EF 50%, diff HK, mild TR, mild PI   History of kidney stones    passed stone - no surgery required   History of stress test    a. ETT-Echo 10/16: normal ECGs, + LAD area ischemia >> will arrange Cardiac CTA   HTN (hypertension)    Hyperlipidemia    Hypertension    Low BMI    LV dysfunction 12/2012, 06/2013   Hx: EF 45-50%, Resolved by Jan 2015 echo- EF 50-55%   Palpitations    Hx only - no problems   Panic attack    HAS BEEN RX'D XANAX   Pelvic pain    With pregnancy   Postpartum hypertension    Preeclampsia 2010,2011   POST PARTUM;WAS PUT ON BP MEDS   Preterm contractions 2007   GIVEN  BETAMETHASONE AND PROCARDIA TO STOP CTXS   Right ovarian cyst 2003   RT OOPHRECTOMY   Spotting in first trimester    With SAB   Stress    work and at home - no meds   SVD (spontaneous vaginal delivery)    x 5    Patient Active Problem List   Diagnosis Date Noted   Adjustment disorder with mixed anxiety and depressed mood 09/17/2019   Major depressive disorder, recurrent episode ([place]) 09/17/2019   Fever 10/19/2013   Fever of unknown origin 10/18/2013   Anxiety 06/28/2013   Hypokalemia 04/28/2013   Chest pain at rest 01/06/2013   LV dysfunction, by Echo EF 45-50%- resolved by Echo Jan 2015 01/06/2013   Tachycardia 01/06/2013   Musculoskeletal leg pain 03/31/2012   Herpes    H/O varicella    Pelvic pain    Panic attack    Right ovarian cyst    Anemia    GERD    Hypertension 11/13/2011   H/O chest pain 06/17/2011   Diastasis recti 12/12/2008   Abnormal Pap smear 08/14/2003    Past Surgical History:  Procedure Laterality Date   breast cyst removal  2005  right   DILATION AND CURETTAGE OF UTERUS  2009   DILITATION & CURRETTAGE/HYSTROSCOPY WITH THERMACHOICE ABLATION N/A 10/11/2013   Procedure: DILATATION & CURETTAGE/HYSTEROSCOPY WITH THERMACHOICE ABLATION;  Surgeon: Betsy Coder, MD;  Location: [facility];  Service: Gynecology;  Laterality: N/A;   LAPAROSCOPY N/A 10/11/2013   Procedure: LAPAROSCOPY OPERATIVE WITH REMOVAL OF HYDROSALPINX ;  Surgeon: Betsy Coder, MD;  Location: [facility];  Service: Gynecology;  Laterality: N/A;   ovary removed     right - laparotomy   TUBAL LIGATION  07/03/2012   Procedure: POST PARTUM TUBAL LIGATION;  Surgeon: Delice Lesch, MD;  Location: [facility];  Service: Gynecology;  Laterality: Bilateral;  Bilateral post partum tubal ligation   WISDOM TOOTH EXTRACTION      Prior to Admission medications   Medication Sig Start Date End Date Taking? Authorizing Provider  docusate sodium (COLACE) 100 MG capsule Take 1 capsule (100 mg total) by mouth 2  (two) times daily. 04/21/21 04/21/22 Yes Riki Gehring N, DO  ibuprofen (ADVIL) 800 MG tablet Take 1 tablet (800 mg total) by mouth every 8 (eight) hours as needed for mild pain. 04/21/21  Yes Prudie Guthridge N, DO  ondansetron (ZOFRAN ODT) 4 MG disintegrating tablet Take 1 tablet (4 mg total) by mouth every 6 (six) hours as needed for nausea or vomiting. 04/21/21  Yes Mattye Verdone, Delice Bison, DO  oxyCODONE-acetaminophen (PERCOCET/ROXICET) 5-325 MG tablet Take 2 tablets by mouth every 6 (six) hours as needed. 04/21/21  Yes Marcella Charlson, Delice Bison, DO  acetaminophen (TYLENOL) 500 MG tablet Take 500 mg by mouth every 6 (six) hours as needed for moderate pain.    [provider]  benzonatate (TESSALON) 100 MG capsule Take 1 capsule (100 mg total) by mouth every 8 (eight) hours. Patient not taking: Reported on 02/23/2019 06/27/18   McDonald, Mia A, PA-C  Chlorpheniramine-Pseudoeph 4-60 MG TABS Take 2 tablets by mouth daily as needed (congestion).    [provider]  chlorthalidone (HYGROTON) 25 MG tablet Take 25 mg by mouth daily.  12/12/18   [provider]  dicyclomine (BENTYL) 20 MG tablet Take 20 mg by mouth 3 (three) times daily as needed for spasms.  09/20/18   [provider]  losartan (COZAAR) 50 MG tablet Take 100 mg by mouth daily. 12/19/18   [provider]  omeprazole (PRILOSEC) 40 MG capsule Take 40 mg by mouth 2 (two) times daily.  09/13/18   [provider]  Phenylephrine-DM-GG-APAP (TYLENOL COLD/FLU SEVERE) 5-10-200-325 MG TABS Take 2 tablets by mouth daily as needed (congestion and indigestion).    [provider]  promethazine (PHENERGAN) 25 MG tablet Take 25 mg by mouth every 6 (six) hours as needed for nausea or vomiting.    [provider]  promethazine-dextromethorphan (PROMETHAZINE-DM) 6.25-15 MG/5ML syrup Take 5 mLs by mouth 4 (four) times daily as needed for cough. 06/27/18   McDonald, Mia A, PA-C  propranolol ER (INDERAL LA) 80 MG 24 hr  capsule Take 80 mg by mouth daily.    [provider]  simethicone (MYLICON) 371 MG chewable tablet Chew 125 mg by mouth every 6 (six) hours as needed for flatulence.    [provider]  sucralfate (CARAFATE) 1 g tablet Take 1 tablet (1 g total) by mouth 4 (four) times daily -  with meals and at bedtime. Patient not taking: Reported on 02/23/2019 12/05/17   Molpus, John, MD    Allergies Hydrochlorothiazide, Labetalol, and Lisinopril  Family History  Problem  Relation Age of Onset   Heart disease Mother        CHF   Hypertension Mother    Asthma Mother        CHILDHOOD   Diabetes Mother    Kidney disease Mother        dialysis   Depression Mother    Alcohol abuse Mother    Drug abuse Mother    Heart failure Mother    Hypertension Father    Alcohol abuse Father    Drug abuse Father    Asthma Sister    COPD Sister        chronic bronchitis   Depression Sister        ATTEMPTED SUICIDE   Arrhythmia Sister        needs ablation    Hypertension Maternal Grandmother    Diabetes Maternal Grandmother     Social History Social History   Tobacco Use   Smoking status: Every Day    Packs/day: 0.15    Types: Cigarettes   Smokeless tobacco: Never  Vaping Use   Vaping Use: Never used  Substance Use Topics   Alcohol use: Not Currently   Drug use: Yes    Types: Marijuana    Comment: Episodic use    Review of Systems Constitutional: No fever. Eyes: No visual changes. ENT: No sore throat. Cardiovascular: Denies chest pain. Respiratory: Denies shortness of breath. Gastrointestinal: No nausea, vomiting, diarrhea. Genitourinary: Negative for dysuria. Musculoskeletal: Negative for back pain. Skin: Negative for rash. Neurological: Negative for focal weakness or numbness.  ____________________________________________   PHYSICAL EXAM:  VITAL SIGNS: ED Triage Vitals  Enc Vitals Group     BP 04/20/21 1853 (!) 157/115     Pulse Rate 04/20/21 1853 (!) 101      Resp 04/20/21 1853 20     Temp 04/20/21 1853 99 F (37.2 C)     Temp Source 04/20/21 1853 Oral     SpO2 04/20/21 1853 98 %     Weight 04/20/21 1854 164 lb 14.5 oz (74.8 kg)     Height 04/20/21 1854 5\' 5"  (1.651 m)     Head Circumference --      Peak Flow --      Pain Score 04/20/21 1854 10     Pain Loc --      Pain Edu? --      Excl. in [place]? --    CONSTITUTIONAL: Alert and oriented and responds appropriately to questions. Well-appearing; well-nourished, afebrile, nontoxic HEAD: Normocephalic EYES: Conjunctivae clear, pupils appear equal, EOM appear intact ENT: normal nose; moist mucous membranes NECK: Supple, normal ROM CARD: RRR; S1 and S2 appreciated; no murmurs, no clicks, no rubs, no gallops RESP: Normal chest excursion without splinting or tachypnea; breath sounds clear and equal bilaterally; no wheezes, no rhonchi, no rales, no hypoxia or respiratory distress, speaking full sentences ABD/GI: Normal bowel sounds; non-distended; soft, tender in the left pelvic region without guarding or rebound, no peritoneal signs BACK: The back appears normal EXT: Normal ROM in all joints; no deformity noted, no edema; no cyanosis SKIN: Normal color for age and race; warm; no rash on exposed skin NEURO: Moves all extremities equally PSYCH: The patient's mood and manner are appropriate.  ____________________________________________   LABS (all labs ordered are listed, but only abnormal results are displayed)  Labs Reviewed  COMPREHENSIVE METABOLIC PANEL - Abnormal; Notable for the following components:      Result Value   AST 14 (*)  All other components within normal limits  CBC - Abnormal; Notable for the following components:   MCV 79.7 (*)    MCH 25.6 (*)    All other components within normal limits  URINALYSIS, ROUTINE W REFLEX MICROSCOPIC - Abnormal; Notable for the following components:   Color, Urine YELLOW (*)    APPearance HAZY (*)    Ketones, ur 5 (*)    All other  components within normal limits  LIPASE, BLOOD  PREGNANCY, URINE  POC URINE PREG, ED   ____________________________________________  EKG   ____________________________________________  RADIOLOGY I, Kahron Kauth, personally viewed and evaluated these images (plain radiographs) as part of my medical decision making, as well as reviewing the written report by the radiologist.  ED MD interpretation: Transvaginal ultrasound shows large left ovarian cyst without torsion.  Official radiology report(s): US PELVIC COMPLETE W TRANSVAGINAL AND TORSION R/O  Result Date: 04/20/2021 CLINICAL DATA:  Left lower quadrant pain EXAM: TRANSABDOMINAL AND TRANSVAGINAL ULTRASOUND OF PELVIS DOPPLER ULTRASOUND OF OVARIES TECHNIQUE: Both transabdominal and transvaginal ultrasound examinations of the pelvis were performed. Transabdominal technique was performed for global imaging of the pelvis including uterus, ovaries, adnexal regions, and pelvic cul-de-sac. It was necessary to proceed with endovaginal exam following the transabdominal exam to visualize the uterus and ovary. Color and duplex Doppler ultrasound was utilized to evaluate blood flow to the ovaries. COMPARISON:  None FINDINGS: Uterus Measurements: 9.2 x 4.4 x 5.3 cm = volume: 111 mL. No fibroids or other mass visualized. Endometrium Thickness: 8 mm.  No focal abnormality visualized. Right ovary Surgically absent Left ovary Measurements: 6.9 x 5.9 x 8.3 cm = volume: 189 mL. Included in this volume measurement is a large left ovarian cyst measures 5.9 x 3.5 x 7.2 cm. Pulsed Doppler evaluation of both the left ovary demonstrates normal low-resistance arterial and venous waveforms. Other findings No abnormal free fluid. IMPRESSION: 1. Large left ovarian cyst measuring up to 7.2 cm. 2. Normal blood flow to the left ovary. 3. Status post right oophorectomy. Electronically Signed   By: Ulyses Jarred M.D.   On: 04/20/2021 20:19     ____________________________________________   PROCEDURES  Procedure(s) performed (including Critical Care):  Procedures   ____________________________________________   INITIAL IMPRESSION / ASSESSMENT AND PLAN / ED COURSE  As part of my medical decision making, I reviewed the following data within the [place] notes reviewed and incorporated, Labs reviewed , Old chart reviewed, A consult was requested and obtained from this/these consultant(s) OBGYN, ultrasound reviewed, Notes from prior ED visits, and [facility] Controlled Substance Database         Patient here with sudden onset severe left pelvic pain.  No urinary symptoms, vaginal bleeding or discharge.  Ultrasound obtained from triage shows large 7.2 cm left ovarian cyst but no torsion.  She has had intermittent waves of severe pain and we discussed that she could be intermittently torsion given how large the cyst is but she is currently well-appearing with a nonsurgical abdomen.  She states her pain is currently well controlled.  Labs reassuring.  Urine shows no sign of infection.  Pregnancy test is negative.  Will discuss with OB/GYN on-call for further recommendations and disposition.  Patient states she does not have a local OB/GYN.    ED PROGRESS  Discussed case with Lucrezia Europe, CNM on-call for OB/GYN.  She has discussed this case with Dr. Ouida Sills.  He states that given patient is not torsed on ultrasound currently and is well-appearing without  a surgical abdomen that it is appropriate for her to follow-up closely as an outpatient.  I discussed this with the patient and she is also comfortable with this plan.  She will call first thing in the morning to schedule outpatient follow-up.  We discussed at length return precautions including symptoms of torsion and when to come back to the emergency department.  Will discharge with pain and nausea medicine.  Patient verbalized understanding and is  comfortable with this plan.   At this time, I do not feel there is any life-threatening condition present. I have reviewed, interpreted and discussed all results (EKG, imaging, lab, urine as appropriate) and exam findings with patient/family. I have reviewed nursing notes and appropriate previous records.  I feel the patient is safe to be discharged home without further emergent workup and can continue workup as an outpatient as needed. Discussed usual and customary return precautions. Patient/family verbalize understanding and are comfortable with this plan.  Outpatient follow-up has been provided as needed. All questions have been answered.  ____________________________________________   FINAL CLINICAL IMPRESSION(S) / ED DIAGNOSES  Final diagnoses:  Left ovarian cyst     ED Discharge Orders          Ordered    oxyCODONE-acetaminophen (PERCOCET/ROXICET) 5-325 MG tablet  Every 6 hours PRN        04/21/21 0111    ibuprofen (ADVIL) 800 MG tablet  Every 8 hours PRN        04/21/21 0111    ondansetron (ZOFRAN ODT) 4 MG disintegrating tablet  Every 6 hours PRN        04/21/21 0111    docusate sodium (COLACE) 100 MG capsule  2 times daily        04/21/21 0111            *Please note:  [name] was evaluated in Emergency Department on 04/21/2021 for the symptoms described in the history of present illness. She was evaluated in the context of the global COVID-19 pandemic, which necessitated consideration that the patient might be at risk for infection with the SARS-CoV-2 virus that causes COVID-19. Institutional protocols and algorithms that pertain to the evaluation of patients at risk for COVID-19 are in a state of rapid change based on information released by regulatory bodies including the CDC and federal and state organizations. These policies and algorithms were followed during the patient's care in the ED.  Some ED evaluations and interventions may be delayed as a result of  limited staffing during and the pandemic.*   Note:  This document was prepared using Dragon voice recognition software and may include unintentional dictation errors.    Kale Rondeau, Delice Bison, DO 04/21/21 [phone]

## 2021-04-21 NOTE — Discharge Instructions (Addendum)
Please call OB/GYN first thing in the morning for close outpatient follow-up.  If you begin having severe abdominal pain that is uncontrolled at home and more persistent, vomiting that does not stop, fever of 100.4 or higher, heavy vaginal bleeding where you are soaking through more than a pad an hour for more than 2 straight hours, please return to the emergency department immediately.  You are being provided a prescription for opiates (also known as narcotics) for pain control.  Opiates can be addictive and should only be used when absolutely necessary for pain control when other alternatives do not work.  We recommend you only use them for the recommended amount of time and only as prescribed.  Please do not take with other sedative medications or alcohol.  Please do not drive, operate machinery, make important decisions while taking opiates.  Please note that these medications can be addictive and have high abuse potential.  Patients can become addicted to narcotics after only taking them for a few days.  Please keep these medications locked away from children, teenagers or any family members with history of substance abuse.  Narcotic pain medicine may also make you constipated.  You may use over-the-counter medications such as MiraLAX, Colace to prevent constipation.  If you become constipated you may use over-the-counter enemas as needed.  Itching and nausea are common side effects of narcotic pain medication.  If you develop uncontrolled vomiting or a rash, please stop these medications.

## 2021-04-22 ENCOUNTER — Other Ambulatory Visit: Payer: Self-pay | Admitting: Obstetrics and Gynecology

## 2021-04-22 NOTE — H&P (Signed)
[name] is a 35 y.o. female here for L/S left ovarian cystecomy  .   Pt here as an unassigned patient from the ED for intermittent left sided pelvic pain .  She had pain in 5/22 and last 3 days then dissipated . Pain returned this past Sunday and has been constant  . She is s/p a right oophorectomy for a dermoid cyst age 28 G6P5 s/p BTL .  Pt has not had gyn preventative exam in some time    Past Medical History:  has a past medical history of Anxiety, Depression, GERD (gastroesophageal reflux disease), and Hypertension.  Past Surgical History:  has a past surgical history that includes abdominal right ovary removed; Endometrial ablation (2018); Dilation and curettage of uterus; Breast biopsy (Left); and Tubal ligation. Family History: family history is not on file. Social History:  reports that she has quit smoking. Her smoking use included cigarettes. She has never used smokeless tobacco. She reports that she does not currently use alcohol. OB/GYN History:          OB History     Gravida  6   Para  5   Term      Preterm      AB  1   Living  5      SAB      IAB      Ectopic      Molar      Multiple      Live Births  5             Allergies: is allergic to hydrochlorothiazide, labetalol, and lisinopril. Medications:   Current Outpatient Medications:    albuterol 90 mcg/actuation inhaler, Inhale 2 inhalations into the lungs, Disp: , Rfl:    amLODIPine (NORVASC) 5 MG tablet, amlodipine 5 mg tablet, Disp: , Rfl:    benzonatate (TESSALON) 100 MG capsule, Take 1 capsule (100 mg total) by mouth 3 (three) times daily as needed for Cough, Disp: , Rfl:    diazePAM (VALIUM) 2 MG tablet, Take by mouth 2 (two) times daily as needed, Disp: , Rfl:    ibuprofen (MOTRIN) 800 MG tablet, Take 1 tablet (800 mg total) by mouth every 6 (six) hours as needed for Pain, Disp: , Rfl:    losartan (COZAAR) 100 MG tablet, losartan 100 mg tablet, Disp: , Rfl:    mirtazapine (REMERON) 30  MG tablet, TAKE 1/2 TABLET BY MOUTH AT NIGHT FOR 7 DAYS THEN 1 TABLET NIGHTLY, Disp: , Rfl:    nortriptyline (PAMELOR) 50 MG capsule, Take by mouth, Disp: , Rfl:    oxyCODONE-acetaminophen (PERCOCET) 5-325 mg tablet, Take 1 tablet by mouth every 6 (six) hours as needed for Pain, Disp: , Rfl:    propranoloL (INNOPRAN XL) 80 MG XL capsule, Take by mouth, Disp: , Rfl:    omeprazole (PRILOSEC) 40 MG DR capsule, Take 2 capsules (80 mg total) by mouth once daily, Disp: , Rfl:    Review of Systems: General:                      No fatigue or weight loss Eyes:                           No vision changes Ears:                            No hearing difficulty  Respiratory:                No cough or shortness of breath Pulmonary:                  No asthma or shortness of breath Cardiovascular:           No chest pain, palpitations, dyspnea on exertion Gastrointestinal:          No abdominal bloating, chronic diarrhea, constipations, masses, pain or hematochezia Genitourinary:             No hematuria, dysuria, abnormal vaginal discharge, pelvic pain, Menometrorrhagia Lymphatic:                   No swollen lymph nodes Musculoskeletal:         No muscle weakness Neurologic:                  No extremity weakness, syncope, seizure disorder Psychiatric:                  No history of depression, delusions or suicidal/homicidal ideation      Exam:       Vitals:    04/22/21 1011  BP: (!) 148/109  Pulse:        Body mass index is 28.79 kg/m.   WDWN / black female in NAD   Lungs: CTA  CV : RRR without murmur   Breast: exam done in sitting and lying position : No dimpling or retraction, no dominant mass, no spontaneous discharge, no axillary adenopathy, right breast scar seen  Neck:  no thyromegaly Abdomen: soft , no mass, normal active bowel sounds,  non-tender, no rebound tenderness Pelvic: tanner stage 5 ,  External genitalia: vulva /labia no lesions Urethra: no prolapse Vagina: normal  physiologic d/c Cervix: no lesions, no cervical motion tenderness   Uterus: normal size shape and contour, left adnexal TTP mild  Adnexa: no mass,  non-tender   Rectovaginal:  Impression:    The primary encounter diagnosis was Cervical cancer screening. A diagnosis of Left ovarian cyst was also pertinent to this visit.   Possible intermittent ovarian  torsion  Routine care otherwise     Plan:     Spoke to the patient about l/s left ovarian cystectomy Benefits and risks to surgery: The proposed benefit of the surgery has been discussed with the patient. The possible risks include, but are not limited to: organ injury to the bowel , bladder, ureters, and major blood vessels and nerves. There is a possibility of additional surgeries resulting from these injuries. There is also the risk of blood transfusion and the need to receive blood products during or after the procedure which may rarely lead to HIV or Hepatitis C infection. There is a risk of developing a deep venous thrombosis or a pulmonary embolism . There is the possibility of wound infection and also anesthetic complications, even the rare possibility of death. The patient understands these risks and wishes to proceed. All questions have been answered and the consent has been signed.

## 2021-04-24 ENCOUNTER — Encounter
Admission: RE | Admit: 2021-04-24 | Discharge: 2021-04-24 | Disposition: A | Discharge status: Home / Self Care | Payer: BC Managed Care – PPO | Source: Ambulatory Visit | Attending: Obstetrics and Gynecology | Admitting: Obstetrics and Gynecology

## 2021-04-24 ENCOUNTER — Other Ambulatory Visit: Payer: Self-pay

## 2021-04-24 DIAGNOSIS — N7011 Chronic salpingitis: Secondary | ICD-10-CM | POA: Diagnosis not present

## 2021-04-24 DIAGNOSIS — I10 Essential (primary) hypertension: Secondary | ICD-10-CM | POA: Diagnosis not present

## 2021-04-24 DIAGNOSIS — Z01818 Encounter for other preprocedural examination: Secondary | ICD-10-CM

## 2021-04-24 DIAGNOSIS — E785 Hyperlipidemia, unspecified: Secondary | ICD-10-CM | POA: Diagnosis not present

## 2021-04-24 DIAGNOSIS — F419 Anxiety disorder, unspecified: Secondary | ICD-10-CM | POA: Diagnosis not present

## 2021-04-24 DIAGNOSIS — N83292 Other ovarian cyst, left side: Secondary | ICD-10-CM | POA: Diagnosis present

## 2021-04-24 DIAGNOSIS — F32A Depression, unspecified: Secondary | ICD-10-CM | POA: Diagnosis not present

## 2021-04-24 DIAGNOSIS — K219 Gastro-esophageal reflux disease without esophagitis: Secondary | ICD-10-CM | POA: Diagnosis not present

## 2021-04-24 DIAGNOSIS — Z90721 Acquired absence of ovaries, unilateral: Secondary | ICD-10-CM | POA: Diagnosis not present

## 2021-04-24 DIAGNOSIS — D649 Anemia, unspecified: Secondary | ICD-10-CM | POA: Diagnosis not present

## 2021-04-24 DIAGNOSIS — D271 Benign neoplasm of left ovary: Secondary | ICD-10-CM | POA: Diagnosis not present

## 2021-04-24 HISTORY — DX: Acute bronchospasm: J98.01

## 2021-04-24 LAB — TYPE AND SCREEN
ABO/RH(D): O POS
Antibody Screen: NEGATIVE

## 2021-04-24 MED ORDER — POVIDONE-IODINE 10 % EX SWAB: 2.0000 "application " | Freq: Once | CUTANEOUS | Status: DC

## 2021-04-24 MED ORDER — CHLORHEXIDINE GLUCONATE 0.12 % MT SOLN: 15.0000 mL | Freq: Once | OROMUCOSAL | Status: AC

## 2021-04-24 MED ORDER — LACTATED RINGERS IV SOLN: INTRAVENOUS | Status: DC

## 2021-04-24 MED ORDER — GABAPENTIN 300 MG PO CAPS: 300.0000 mg | ORAL_CAPSULE | ORAL | Status: AC

## 2021-04-24 MED ORDER — ACETAMINOPHEN 500 MG PO TABS: 1000.0000 mg | ORAL_TABLET | ORAL | Status: AC

## 2021-04-24 MED ORDER — ORAL CARE MOUTH RINSE: 15.0000 mL | Freq: Once | OROMUCOSAL | Status: AC

## 2021-04-24 NOTE — Patient Instructions (Addendum)
Your procedure is scheduled on: Friday, November 11 Report to the Registration Desk on the 1st floor of the [facility]. To find out your arrival time, please call [phone] between 1PM - 3PM on: Thursday, November 10  REMEMBER: Instructions that are not followed completely may result in serious medical risk, up to and including death; or upon the discretion of your surgeon and anesthesiologist your surgery may need to be rescheduled.  Do not eat food after midnight the night before surgery.  No gum chewing, lozengers or hard candies.  You may however, drink CLEAR liquids up to 2 hours before you are scheduled to arrive for your surgery. Do not drink anything within 2 hours of your scheduled arrival time.  Clear liquids include: - water  - apple juice without pulp - gatorade (not RED, PURPLE, OR BLUE) - black coffee or tea (Do NOT add milk or creamers to the coffee or tea) Do NOT drink anything that is not on this list.  In addition, your doctor has ordered for you to drink the provided  Ensure Pre-Surgery Clear Carbohydrate Drink  Drinking this carbohydrate drink up to two hours before surgery helps to reduce insulin resistance and improve patient outcomes. Please complete drinking 2 hours prior to scheduled arrival time.  TAKE THESE MEDICATIONS THE MORNING OF SURGERY WITH A SIP OF WATER:  Albuterol inhaler Amlodipine Mirtazapine (Remeron) Pantoprazole (Protonix) - (take one the night before and one on the morning of surgery - helps to prevent nausea after surgery.) Propranolol  Use inhalers on the day of surgery and bring to the hospital.  One week prior to surgery: Stop Anti-inflammatories (NSAIDS) such as Advil, Aleve, Ibuprofen, Motrin, Naproxen, Naprosyn and Aspirin based products such as Excedrin, Goodys Powder, BC Powder. Stop ANY OVER THE COUNTER supplements until after surgery. You may however, continue to take Tylenol if needed for pain up until the day of  surgery.  No Alcohol for 24 hours before or after surgery.  No Smoking including e-cigarettes for 24 hours prior to surgery.  No chewable tobacco products for at least 6 hours prior to surgery.  No nicotine patches on the day of surgery.  Do not use any "recreational" drugs for at least a week prior to your surgery.  Please be advised that the combination of cocaine and anesthesia may have negative outcomes, up to and including death. If you test positive for cocaine, your surgery will be cancelled.  On the morning of surgery brush your teeth with toothpaste and water, you may rinse your mouth with mouthwash if you wish. Do not swallow any toothpaste or mouthwash.  Use CHG Soap as directed on instruction sheet.  Do not wear jewelry, make-up, hairpins, clips or nail polish.  Do not wear lotions, powders, or perfumes.   Do not shave body from the neck down 48 hours prior to surgery just in case you cut yourself which could leave a site for infection.  Also, freshly shaved skin may become irritated if using the CHG soap.  Contact lenses, hearing aids and dentures may not be worn into surgery.  Do not bring valuables to the hospital. [facility] is not responsible for any missing/lost belongings or valuables.   Notify your doctor if there is any change in your medical condition (cold, fever, infection).  Wear comfortable clothing (specific to your surgery type) to the hospital.  After surgery, you can help prevent lung complications by doing breathing exercises.  Take deep breaths and cough every 1-2  hours. Your doctor may order a device called an Incentive Spirometer to help you take deep breaths. When coughing or sneezing, hold a pillow firmly against your incision with both hands. This is called "splinting." Doing this helps protect your incision. It also decreases belly discomfort.  If you are being discharged the day of surgery, you will not be allowed to drive home. You will  need a responsible adult (18 years or older) to drive you home and stay with you that night.   If you are taking public transportation, you will need to have a responsible adult (18 years or older) with you. Please confirm with your physician that it is acceptable to use public transportation.   Please call the [facility] at [phone] if you have any questions about these instructions.  Surgery Visitation Policy:  Patients undergoing a surgery or procedure may have one family member or support person with them as long as that person is not COVID-19 positive or experiencing its symptoms.  That person may remain in the waiting area during the procedure and may rotate out with other people.

## 2021-04-25 ENCOUNTER — Ambulatory Visit: Payer: BC Managed Care – PPO | Admitting: Urgent Care

## 2021-04-25 ENCOUNTER — Ambulatory Visit
Admission: RE | Admit: 2021-04-25 | Discharge: 2021-04-25 | Disposition: A | Discharge status: Home / Self Care | Payer: BC Managed Care – PPO | Attending: Obstetrics and Gynecology | Admitting: Obstetrics and Gynecology

## 2021-04-25 ENCOUNTER — Encounter
Admission: RE | Disposition: A | Discharge status: Home / Self Care | Payer: Self-pay | Source: Home / Self Care | Attending: Obstetrics and Gynecology

## 2021-04-25 ENCOUNTER — Encounter: Payer: Self-pay | Admitting: Obstetrics and Gynecology

## 2021-04-25 DIAGNOSIS — N7011 Chronic salpingitis: Secondary | ICD-10-CM | POA: Insufficient documentation

## 2021-04-25 DIAGNOSIS — F32A Depression, unspecified: Secondary | ICD-10-CM | POA: Insufficient documentation

## 2021-04-25 DIAGNOSIS — D649 Anemia, unspecified: Secondary | ICD-10-CM | POA: Insufficient documentation

## 2021-04-25 DIAGNOSIS — Z01818 Encounter for other preprocedural examination: Secondary | ICD-10-CM

## 2021-04-25 DIAGNOSIS — F419 Anxiety disorder, unspecified: Secondary | ICD-10-CM | POA: Insufficient documentation

## 2021-04-25 DIAGNOSIS — I10 Essential (primary) hypertension: Secondary | ICD-10-CM | POA: Insufficient documentation

## 2021-04-25 DIAGNOSIS — K219 Gastro-esophageal reflux disease without esophagitis: Secondary | ICD-10-CM | POA: Insufficient documentation

## 2021-04-25 DIAGNOSIS — Z90721 Acquired absence of ovaries, unilateral: Secondary | ICD-10-CM | POA: Insufficient documentation

## 2021-04-25 DIAGNOSIS — D271 Benign neoplasm of left ovary: Secondary | ICD-10-CM | POA: Diagnosis not present

## 2021-04-25 DIAGNOSIS — E785 Hyperlipidemia, unspecified: Secondary | ICD-10-CM | POA: Insufficient documentation

## 2021-04-25 HISTORY — PX: LAPAROSCOPIC OVARIAN CYSTECTOMY: SHX6248

## 2021-04-25 LAB — POCT PREGNANCY, URINE: Preg Test, Ur: NEGATIVE

## 2021-04-25 SURGERY — EXCISION, CYST, OVARY, LAPAROSCOPIC
Anesthesia: General | Laterality: Left

## 2021-04-25 MED ORDER — DEXMEDETOMIDINE (PRECEDEX) IN NS 20 MCG/5ML (4 MCG/ML) IV SYRINGE
PREFILLED_SYRINGE | INTRAVENOUS | Status: AC
  Filled 2021-04-25: qty 5

## 2021-04-25 MED ORDER — MIDAZOLAM HCL 2 MG/2ML IJ SOLN
INTRAMUSCULAR | Status: DC | PRN
  Administered 2021-04-25: 2 mg via INTRAVENOUS

## 2021-04-25 MED ORDER — LIDOCAINE HCL (CARDIAC) PF 100 MG/5ML IV SOSY
PREFILLED_SYRINGE | INTRAVENOUS | Status: DC | PRN
  Administered 2021-04-25 (×2): 40 mg via INTRAVENOUS

## 2021-04-25 MED ORDER — LIDOCAINE HCL (PF) 2 % IJ SOLN
INTRAMUSCULAR | Status: AC
  Filled 2021-04-25: qty 5

## 2021-04-25 MED ORDER — OXYCODONE HCL 5 MG/5ML PO SOLN: 5.0000 mg | Freq: Once | ORAL | Status: AC | PRN

## 2021-04-25 MED ORDER — FENTANYL CITRATE (PF) 100 MCG/2ML IJ SOLN
INTRAMUSCULAR | Status: AC
  Filled 2021-04-25: qty 2

## 2021-04-25 MED ORDER — SUCCINYLCHOLINE CHLORIDE 200 MG/10ML IV SOSY: PREFILLED_SYRINGE | INTRAVENOUS | Status: DC | PRN

## 2021-04-25 MED ORDER — MEPERIDINE HCL 25 MG/ML IJ SOLN: 6.2500 mg | INTRAMUSCULAR | Status: DC | PRN

## 2021-04-25 MED ORDER — DEXMEDETOMIDINE (PRECEDEX) IN NS 20 MCG/5ML (4 MCG/ML) IV SYRINGE
PREFILLED_SYRINGE | INTRAVENOUS | Status: DC | PRN
  Administered 2021-04-25 (×2): 4 ug via INTRAVENOUS
  Administered 2021-04-25: 8 ug via INTRAVENOUS

## 2021-04-25 MED ORDER — KETOROLAC TROMETHAMINE 30 MG/ML IJ SOLN
INTRAMUSCULAR | Status: DC | PRN
  Administered 2021-04-25: 30 mg via INTRAVENOUS

## 2021-04-25 MED ORDER — PROPOFOL 10 MG/ML IV BOLUS
INTRAVENOUS | Status: DC | PRN
  Administered 2021-04-25: 200 mg via INTRAVENOUS

## 2021-04-25 MED ORDER — KETOROLAC TROMETHAMINE 30 MG/ML IJ SOLN
INTRAMUSCULAR | Status: AC
  Filled 2021-04-25: qty 1

## 2021-04-25 MED ORDER — ACETAMINOPHEN 500 MG PO TABS
ORAL_TABLET | ORAL | Status: AC
  Administered 2021-04-25: 1000 mg via ORAL
  Filled 2021-04-25: qty 2

## 2021-04-25 MED ORDER — FENTANYL CITRATE (PF) 100 MCG/2ML IJ SOLN
INTRAMUSCULAR | Status: DC | PRN
  Administered 2021-04-25 (×4): 50 ug via INTRAVENOUS

## 2021-04-25 MED ORDER — 0.9 % SODIUM CHLORIDE (POUR BTL) OPTIME
TOPICAL | Status: DC | PRN
  Administered 2021-04-25: 500 mL

## 2021-04-25 MED ORDER — ROCURONIUM BROMIDE 100 MG/10ML IV SOLN
INTRAVENOUS | Status: DC | PRN
  Administered 2021-04-25: 10 mg via INTRAVENOUS
  Administered 2021-04-25: 50 mg via INTRAVENOUS

## 2021-04-25 MED ORDER — SUGAMMADEX SODIUM 500 MG/5ML IV SOLN
INTRAVENOUS | Status: DC | PRN
  Administered 2021-04-25: 200 mg via INTRAVENOUS

## 2021-04-25 MED ORDER — LACTATED RINGERS IV SOLN: INTRAVENOUS | Status: DC | PRN

## 2021-04-25 MED ORDER — PROPOFOL 10 MG/ML IV BOLUS
INTRAVENOUS | Status: AC
  Filled 2021-04-25: qty 20

## 2021-04-25 MED ORDER — HYDROCODONE-ACETAMINOPHEN 5-325 MG PO TABS: 1.0000 | ORAL_TABLET | Freq: Four times a day (QID) | ORAL | Status: DC | PRN

## 2021-04-25 MED ORDER — DEXAMETHASONE SODIUM PHOSPHATE 10 MG/ML IJ SOLN
INTRAMUSCULAR | Status: DC | PRN
  Administered 2021-04-25: 10 mg via INTRAVENOUS

## 2021-04-25 MED ORDER — ONDANSETRON HCL 4 MG PO TABS: 4.0000 mg | ORAL_TABLET | Freq: Two times a day (BID) | ORAL | Status: DC

## 2021-04-25 MED ORDER — OXYCODONE HCL 5 MG PO TABS
ORAL_TABLET | ORAL | Status: AC
  Filled 2021-04-25: qty 1

## 2021-04-25 MED ORDER — ONDANSETRON HCL 4 MG/2ML IJ SOLN
INTRAMUSCULAR | Status: DC | PRN
  Administered 2021-04-25 (×2): 4 mg via INTRAVENOUS

## 2021-04-25 MED ORDER — SILVER NITRATE-POT NITRATE 75-25 % EX MISC
CUTANEOUS | Status: AC
  Filled 2021-04-25: qty 10

## 2021-04-25 MED ORDER — CHLORHEXIDINE GLUCONATE 0.12 % MT SOLN
OROMUCOSAL | Status: AC
  Administered 2021-04-25: 15 mL via OROMUCOSAL
  Filled 2021-04-25: qty 15

## 2021-04-25 MED ORDER — PROMETHAZINE HCL 25 MG/ML IJ SOLN: 6.2500 mg | INTRAMUSCULAR | Status: DC | PRN

## 2021-04-25 MED ORDER — GABAPENTIN 300 MG PO CAPS
ORAL_CAPSULE | ORAL | Status: AC
  Administered 2021-04-25: 300 mg via ORAL
  Filled 2021-04-25: qty 1

## 2021-04-25 MED ORDER — FENTANYL CITRATE (PF) 100 MCG/2ML IJ SOLN
25.0000 ug | INTRAMUSCULAR | Status: DC | PRN
  Administered 2021-04-25: 50 ug via INTRAVENOUS

## 2021-04-25 MED ORDER — SEVOFLURANE IN SOLN
RESPIRATORY_TRACT | Status: AC
  Filled 2021-04-25: qty 250

## 2021-04-25 MED ORDER — MIDAZOLAM HCL 2 MG/2ML IJ SOLN
INTRAMUSCULAR | Status: AC
  Filled 2021-04-25: qty 2

## 2021-04-25 MED ORDER — OXYCODONE HCL 5 MG PO TABS
5.0000 mg | ORAL_TABLET | Freq: Once | ORAL | Status: AC | PRN
  Administered 2021-04-25: 5 mg via ORAL

## 2021-04-25 MED ORDER — BUPIVACAINE HCL 0.5 % IJ SOLN
INTRAMUSCULAR | Status: DC | PRN
  Administered 2021-04-25: 11 mL

## 2021-04-25 MED ORDER — HYDRALAZINE HCL 20 MG/ML IJ SOLN
INTRAMUSCULAR | Status: DC | PRN
  Administered 2021-04-25 (×2): 10 mg via INTRAVENOUS

## 2021-04-25 MED ORDER — HYDRALAZINE HCL 20 MG/ML IJ SOLN
INTRAMUSCULAR | Status: AC
  Filled 2021-04-25: qty 1

## 2021-04-25 MED ORDER — PROMETHAZINE HCL 25 MG/ML IJ SOLN
INTRAMUSCULAR | Status: AC
  Administered 2021-04-25: 6.25 mg via INTRAVENOUS
  Filled 2021-04-25: qty 1

## 2021-04-25 MED ORDER — SODIUM CHLORIDE FLUSH 0.9 % IV SOLN
INTRAVENOUS | Status: AC
  Filled 2021-04-25: qty 10

## 2021-04-25 MED ORDER — BUPIVACAINE HCL (PF) 0.5 % IJ SOLN
INTRAMUSCULAR | Status: AC
  Filled 2021-04-25: qty 30

## 2021-04-25 MED ORDER — HYDRALAZINE HCL 20 MG/ML IJ SOLN: INTRAMUSCULAR | Status: DC | PRN

## 2021-04-25 MED ORDER — ROCURONIUM BROMIDE 10 MG/ML (PF) SYRINGE
PREFILLED_SYRINGE | INTRAVENOUS | Status: AC
  Filled 2021-04-25: qty 10

## 2021-04-25 SURGICAL SUPPLY — 40 items
BAG DRN RND TRDRP ANRFLXCHMBR (UROLOGICAL SUPPLIES) ×1
BAG SPEC RTRVL LRG 6X4 10 (ENDOMECHANICALS) ×1
BAG URINE DRAIN 2000ML AR STRL (UROLOGICAL SUPPLIES) ×2 IMPLANT
BLADE SURG SZ11 CARB STEEL (BLADE) ×2 IMPLANT
CATH ROBINSON RED A/P 16FR (CATHETERS) ×2 IMPLANT
DRSG TEGADERM 2-3/8X2-3/4 SM (GAUZE/BANDAGES/DRESSINGS) ×4 IMPLANT
GAUZE 4X4 16PLY ~~LOC~~+RFID DBL (SPONGE) ×3 IMPLANT
GLOVE SURG SYN 8.0 (GLOVE) ×2 IMPLANT
GLOVE SURG SYN 8.0 PF PI (GLOVE) ×1 IMPLANT
GOWN STRL REUS W/ TWL LRG LVL3 (GOWN DISPOSABLE) ×2 IMPLANT
GOWN STRL REUS W/ TWL XL LVL3 (GOWN DISPOSABLE) ×1 IMPLANT
GOWN STRL REUS W/TWL LRG LVL3 (GOWN DISPOSABLE) ×4
GOWN STRL REUS W/TWL XL LVL3 (GOWN DISPOSABLE) ×2
GRASPER SUT TROCAR 14GX15 (MISCELLANEOUS) ×2 IMPLANT
IRRIGATION STRYKERFLOW (MISCELLANEOUS) ×1 IMPLANT
IRRIGATOR STRYKERFLOW (MISCELLANEOUS) ×2
IV NS 1000ML (IV SOLUTION) ×2
IV NS 1000ML BAXH (IV SOLUTION) ×1 IMPLANT
KIT PINK PAD W/HEAD ARE REST (MISCELLANEOUS) ×2
KIT PINK PAD W/HEAD ARM REST (MISCELLANEOUS) ×1 IMPLANT
KIT TURNOVER CYSTO (KITS) ×2 IMPLANT
LABEL OR SOLS (LABEL) ×2 IMPLANT
MANIFOLD NEPTUNE II (INSTRUMENTS) ×2 IMPLANT
NS IRRIG 500ML POUR BTL (IV SOLUTION) ×2 IMPLANT
PACK GYN LAPAROSCOPIC (MISCELLANEOUS) ×2 IMPLANT
PAD PREP 24X41 OB/GYN DISP (PERSONAL CARE ITEMS) ×2 IMPLANT
POUCH SPECIMEN RETRIEVAL 10MM (ENDOMECHANICALS) ×2 IMPLANT
SCRUB EXIDINE 4% CHG 4OZ (MISCELLANEOUS) ×2 IMPLANT
SET TUBE SMOKE EVAC HIGH FLOW (TUBING) ×2 IMPLANT
SHEARS HARMONIC ACE PLUS 36CM (ENDOMECHANICALS) ×2 IMPLANT
SLEEVE ENDOPATH XCEL 5M (ENDOMECHANICALS) ×3 IMPLANT
SPONGE GAUZE 2X2 8PLY STRL LF (GAUZE/BANDAGES/DRESSINGS) ×3 IMPLANT
STRIP CLOSURE SKIN 1/4X4 (GAUZE/BANDAGES/DRESSINGS) ×2 IMPLANT
SUT VIC AB 0 CT1 36 (SUTURE) ×2 IMPLANT
SUT VIC AB 4-0 SH 27 (SUTURE) ×2
SUT VIC AB 4-0 SH 27XANBCTRL (SUTURE) ×1 IMPLANT
SYR 50ML LL SCALE MARK (SYRINGE) ×2 IMPLANT
TROCAR ENDO BLADELESS 11MM (ENDOMECHANICALS) ×2 IMPLANT
TROCAR XCEL NON-BLD 5MMX100MML (ENDOMECHANICALS) ×2 IMPLANT
WATER STERILE IRR 500ML POUR (IV SOLUTION) ×2 IMPLANT

## 2021-04-25 NOTE — Brief Op Note (Signed)
04/25/2021  2:21 PM  PATIENT:  [place] Siren [place]  35 y.o. female  PRE-OPERATIVE DIAGNOSIS:  symptomatic left ovarian cyst  POST-OPERATIVE DIAGNOSIS:  left ovarian demoid and left hydrosalpinx   PROCEDURE:  laparoscopiv left salpingo-ophorectomy SURGEON:  Surgeon(s) and Role:    * Antonia Jicha, Gwen Her, MD - Primary    * Benjaman Kindler, MD - Assisting  PHYSICIAN ASSISTANT:   ASSISTANTS: none   ANESTHESIA:   general  EBL:  5 cc , iof 800 cc ou 300 cc   BLOOD ADMINISTERED:none  DRAINS: none   LOCAL MEDICATIONS USED:  MARCAINE     SPECIMEN:  Source of Specimen:  left ovarian dermoid and left tube   DISPOSITION OF SPECIMEN:  PATHOLOGY  COUNTS:  YES  TOURNIQUET:  * No tourniquets in log *  DICTATION: .Other Dictation: Dictation Number verbal  PLAN OF CARE: Discharge to home after PACU  PATIENT DISPOSITION:  PACU - hemodynamically stable.   Delay start of Pharmacological VTE agent (>24hrs) due to surgical blood loss or risk of bleeding: not applicable

## 2021-04-25 NOTE — Anesthesia Procedure Notes (Signed)
Procedure Name: Intubation Date/Time: 04/25/2021 1:13 PM Performed by: Jonna Clark, CRNA Pre-anesthesia Checklist: Patient identified, Patient being monitored, Timeout performed, Emergency Drugs available and Suction available Patient Re-evaluated:Patient Re-evaluated prior to induction Oxygen Delivery Method: Circle system utilized Preoxygenation: Pre-oxygenation with 100% oxygen Induction Type: IV induction Ventilation: Mask ventilation without difficulty Laryngoscope Size: Mac and 3 Grade View: Grade I Tube type: Oral Tube size: 7.0 mm Number of attempts: 1 Airway Equipment and Method: Stylet Placement Confirmation: ETT inserted through vocal cords under direct vision, positive ETCO2, breath sounds checked- equal and bilateral and CO2 detector Secured at: 21 cm Tube secured with: Tape Dental Injury: Teeth and Oropharynx as per pre-operative assessment

## 2021-04-25 NOTE — Progress Notes (Signed)
Pt is ready for a L/S left ovarian cystectomy  Labs reviewed . Neg HCG . All questions answered . Proceed

## 2021-04-25 NOTE — Anesthesia Preprocedure Evaluation (Signed)
Anesthesia Evaluation  Patient identified by MRN, date of birth, ID band Patient awake    Reviewed: Allergy & Precautions, NPO status , Patient's Chart, lab work & pertinent test results  History of Anesthesia Complications Negative for: history of anesthetic complications  Airway Mallampati: I  TM Distance: >3 FB Neck ROM: Full    Dental no notable dental hx.    Pulmonary neg sleep apnea, neg COPD, Current Smoker and Patient abstained from smoking.,    breath sounds clear to auscultation- rhonchi (-) wheezing      Cardiovascular hypertension, Pt. on medications (-) CAD, (-) Past MI, (-) Cardiac Stents and (-) CABG  Rhythm:Regular Rate:Normal - Systolic murmurs and - Diastolic murmurs    Neuro/Psych  Headaches, neg Seizures PSYCHIATRIC DISORDERS Anxiety Depression    GI/Hepatic Neg liver ROS, GERD  ,  Endo/Other  negative endocrine ROSneg diabetes  Renal/GU negative Renal ROS     Musculoskeletal negative musculoskeletal ROS (+)   Abdominal (+) - obese,   Peds  Hematology  (+) anemia ,   Anesthesia Other Findings Past Medical History: 08/14/2003: Abnormal Pap smear     Comment:  colpo No date: Anemia     Comment:  History -FeSO4 SUPP IN PAST No date: Bronchospasm No date: Carpal tunnel syndrome of right wrist 12/12/2008: Diastasis recti 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 Cardiac CTA 10/16: no CAD No date: History of echocardiogram     Comment:  a. Echo 10/16: EF 50%, diff HK, mild TR, mild PI No date: History of kidney stones     Comment:  passed stone - no surgery required No date: History of stress test     Comment:   a. ETT-Echo 10/16: normal ECGs, + LAD area ischemia >>               will arrange Cardiac CTA No date: HTN (hypertension) No date: Hyperlipidemia No date: Hypertension No date: Low BMI 12/2012, 06/2013: LV dysfunction     Comment:  Hx: EF 45-50%, Resolved by Jan 2015 echo- EF 50-55% No date: Palpitations     Comment:  Hx only - no problems No date: Panic attack     Comment:  HAS BEEN RX'D XANAX No date: Pelvic pain     Comment:  With pregnancy No date: Postpartum hypertension 2010,2011: Preeclampsia     Comment:  POST PARTUM;WAS PUT ON BP MEDS 06/15/2005: Preterm contractions     Comment:  GIVEN BETAMETHASONE AND PROCARDIA TO STOP CTXS 06/15/2001: Right ovarian cyst     Comment:  RT OOPHRECTOMY No date: Spotting in first trimester     Comment:  With SAB No date: Stress     Comment:  work and at home - no meds No date: SVD (spontaneous vaginal delivery)     Comment:  x 5   Reproductive/Obstetrics                             Anesthesia Physical Anesthesia Plan  ASA: 2  Anesthesia Plan: General   Post-op Pain Management:  Induction: Intravenous  PONV Risk Score and Plan: 1 and Ondansetron, Dexamethasone and Midazolam  Airway Management Planned: Oral ETT  Additional Equipment:   Intra-op Plan:   Post-operative Plan: Extubation in OR  Informed Consent: I have reviewed the patients History and Physical, chart, labs and discussed the procedure including the risks, benefits and alternatives for the proposed anesthesia with the patient or authorized representative who has indicated his/her understanding and acceptance.     Dental advisory given  Plan Discussed with: CRNA and Anesthesiologist  Anesthesia Plan Comments:         Anesthesia Quick Evaluation

## 2021-04-25 NOTE — Anesthesia Postprocedure Evaluation (Signed)
Anesthesia Post Note  Patient: [name]  Procedure(s) Performed: LAPAROSCOPIC OVARIAN CYSTECTOMY and salpingectomy-oophorectomy (Left)  Patient location during evaluation: PACU Anesthesia Type: General Level of consciousness: awake and alert Pain management: pain level controlled Vital Signs Assessment: post-procedure vital signs reviewed and stable Respiratory status: spontaneous breathing, nonlabored ventilation, respiratory function stable and patient connected to nasal cannula oxygen Cardiovascular status: blood pressure returned to baseline and stable Postop Assessment: no apparent nausea or vomiting Anesthetic complications: no   No notable events documented.   Last Vitals:  Vitals:   04/25/21 1445 04/25/21 1458  BP: (!) 139/95 (!) 125/96  Pulse: 91   Resp: 16 14  Temp:    SpO2: 99% 98%    Last Pain:  Vitals:   04/25/21 1117  TempSrc: Oral  PainSc: 0-No pain                 Brett Canales Yareth Kearse

## 2021-04-25 NOTE — Discharge Instructions (Signed)

## 2021-04-25 NOTE — Transfer of Care (Signed)
Immediate Anesthesia Transfer of Care Note  Patient: [name]  Procedure(s) Performed: LAPAROSCOPIC OVARIAN CYSTECTOMY and salpingectomy-oophorectomy (Left)  Patient Location: PACU  Anesthesia Type:General  Level of Consciousness: drowsy and patient cooperative  Airway & Oxygen Therapy: Patient Spontanous Breathing  Post-op Assessment: Report given to RN and Post -op Vital signs reviewed and stable  Post vital signs: Reviewed and stable  Last Vitals:  Vitals Value Taken Time  BP 125/96 04/25/21 1458  Temp    Pulse 88 04/25/21 1458  Resp 16 04/25/21 1458  SpO2 100 % 04/25/21 1458  Vitals shown include unvalidated device data.  Last Pain:  Vitals:   04/25/21 1117  TempSrc: Oral  PainSc: 0-No pain         Complications: No notable events documented.

## 2021-04-26 ENCOUNTER — Encounter: Payer: Self-pay | Admitting: Obstetrics and Gynecology

## 2021-04-26 NOTE — Op Note (Signed)
NAME: [name], [name] MEDICAL RECORD NO: [id number] ACCOUNT NO: [id number] DATE OF BIRTH: 1985-06-28 FACILITY: [facility] LOCATION: [facility] PHYSICIAN: Boykin Nearing, MD  Operative Report   DATE OF PROCEDURE: 04/25/2021  PREOPERATIVE DIAGNOSIS:  Symptomatic left ovarian cyst.  POSTOPERATIVE DIAGNOSES:   1.  Left hydrosalpinx. 2.  Left ovarian dermoid cyst.  PROCEDURE:  Laparoscopic left salpingo-oophorectomy.  SURGEON:  Boykin Nearing, MD  FIRST ASSISTANTLeafy Ro.  ANESTHESIA:  General endotracheal anesthesia.  INDICATION:  A 35 year old gravida 6, para 5.  The patient with several intermittent episodes of severe left lower quadrant pain.  The patient was seen in the emergency department and showed a 7 cm left ovarian cyst.  The patient has had a prior history  of an oophorectomy at age 49 for dermoid cyst.  DESCRIPTION OF PROCEDURE:  After adequate general endotracheal anesthesia, the patient was placed in dorsal supine position with the legs in the Winchester stirrups.  The patient's abdomen was prepped and draped in normal sterile fashion.  Straight  catheterization of the bladder yielded 300 mL urine and a double-loaded sponge stick was placed into the vagina for uterine manipulation of the procedure.  Timeout was performed.  Gloves were changed, gown was changed and attention was directed to the  patient's abdomen where a 5 mm infraumbilical incision was made after injection with 0.5% Marcaine.  A 5 mm laparoscope was advanced into the abdominal cavity without difficulty.  Second port was placed right lower quadrant 3 cm medial to the right  anterior iliac spine and a 5 mm trocar was advanced under direct visualization.  Initial impression revealed a tortuous large hydrosalpinx overriding a complex left ovarian cyst.  Left ovarian cyst appeared solid and was abnormal in appearance.  Third  port was placed left lower quadrant 3 cm medial to the left anterior iliac  spine.  An 11 mm trocar was advanced under direct visualization.  Given the tortuosity and the abnormal appearance of the left ovary and dilated fallopian tube, it was decided  that the left fallopian tube and ovary would be removed. The Kleppinger cautery was used to cauterize the left infundibulopelvic ligament and the Harmonic scalpel was used to transect the pedicle.  The left ureter had been previously identified and  normal peristaltic activity was visualized before cauterization.  The left ovary and fallopian tube were then placed in an Endobag and brought to the left lower quadrant incision and initially was drained with a percutaneous syringe.  The Endobag was  brought all the way to the opening of the abdominal incision and the ovary was then brought out with an Allis clamp that demonstrated a large amount of air and fat consistent with a dermoid cyst.  Rest of the contents in the bag were removed through the  incision.  The pedicle appeared hemostatic, pressure was lowered to 7 mmHg and again appeared hemostatic.  Normal ureteral peristalsis was identified.  The left lower quadrant fascial defect was closed with a 2-0 Vicryl suture with the Carter-Thomason  cone and the patient's abdomen was deflated, then and all 3 incisions were closed with interrupted 4-0 Vicryl suture.  Sponge stick was removed with the catheter.  There were no complications.  ESTIMATED BLOOD LOSS:  5 mL  INTRAOPERATIVE FLUIDS:  800 mL  URINE OUTPUT:  300 mL  The patient did tolerate the procedure well and was given 30 mg intravenous Toradol at the end of the procedure.   VAI D: 04/25/2021 5:33:11 pm T:  04/26/2021 12:43:00 am  JOB: [id number]/ [id number]

## 2021-04-29 LAB — SURGICAL PATHOLOGY

## 2021-06-05 ENCOUNTER — Encounter: Payer: BC Managed Care – PPO | Admitting: Obstetrics & Gynecology

## 2023-04-28 ENCOUNTER — Other Ambulatory Visit: Payer: Self-pay | Admitting: Radiation Oncology

## 2023-04-28 ENCOUNTER — Inpatient Hospital Stay
Admission: RE | Admit: 2023-04-28 | Discharge: 2023-04-28 | Disposition: A | Discharge status: Home / Self Care | Payer: Self-pay | Source: Ambulatory Visit | Attending: Radiation Oncology | Admitting: Radiation Oncology

## 2023-04-28 ENCOUNTER — Telehealth: Payer: Self-pay | Admitting: Radiation Oncology

## 2023-04-28 DIAGNOSIS — Z170 Estrogen receptor positive status [ER+]: Secondary | ICD-10-CM

## 2023-04-28 NOTE — Telephone Encounter (Signed)
11/13 Follow up to [facility] for recent images to be push to powershare.  Left voicemail for [facility] to transfer breast images to PACs in epic.  Waiting on images.

## 2023-05-04 ENCOUNTER — Ambulatory Visit: Payer: BC Managed Care – PPO

## 2023-05-04 ENCOUNTER — Ambulatory Visit
Admission: RE | Admit: 2023-05-04 | Discharge: 2023-05-04 | Disposition: A | Discharge status: Home / Self Care | Payer: BC Managed Care – PPO | Source: Ambulatory Visit | Attending: Radiation Oncology | Admitting: Radiation Oncology

## 2023-05-04 DIAGNOSIS — Z170 Estrogen receptor positive status [ER+]: Secondary | ICD-10-CM

## 2023-05-10 NOTE — Progress Notes (Signed)
Radiation Oncology         [phone] ________________________________  Initial Outpatient Consultation  Name: [name] MRN: [id number]  Date: 05/11/2023  DOB: Sep 17, 1985  CC:Stevphen Rochester, MD  Edwyna Shell, MD   REFERRING PHYSICIAN: Edwyna Shell, MD  DIAGNOSIS: [phone]   ICD-10-CM   1. Malignant neoplasm of upper-outer quadrant of left breast in female, estrogen receptor positive (HCC)  C50.412    Z17.0         Cancer Staging  Malignant neoplasm of upper outer quadrant of female breast (HCC) Staging form: Breast, AJCC 8th Edition - Pathologic stage from 05/04/2023: ypT1b, pN1a(sn), cM0, G3, ER+, PR+, HER2- - Unsigned Stage prefix: Post-therapy Response to neoadjuvant therapy: Partial response Method of lymph node assessment: Sentinel lymph node biopsy Multigene prognostic tests performed: Other Histologic grading system: 3 grade system  Stage IIA Left Breast UOQ, Multifocal Invasive Ductal Carcinoma, ER+ / PR+ / Her2-, Grade 3: s/p neoadjuvant chemotherapy, followed by bilateral mastectomies with left axillary nodal evaluation - positive for nodal involvement and residual tumor s/p neoadjuvant chemotherapy   CHIEF COMPLAINT: Here to discuss management of left breast cancer  HISTORY OF PRESENT ILLNESS:[name] is a 37 y.o. female who presented with a palpable lump in the lower upper left breast this past April 2024. She subsequently presented bilateral diagnostic mammogram and bilateral breast ultrasound at [facility] on 09/30/22 which revealed groups of suspicious solid masses in the left upper  outer quadrant, measuring 0.8 cm, 1.7 cm, and 1 cm (collected measuring 3.9 cm) correlating with the region of palpable concern. An additional 1 cm solid mass located inferior and medial to the solid masses, as well as an indeterminate left axillary lymph node, were also demonstrated.   Biopsies collected on 10/02/22 are detailed as follows:  -- Biopsy of  the upper outer left breast, 11 cmfn, showed grade 3 invasive ductal carcinoma measuring 9 mm in the greatest linear extent of the sample, with high grade DCIS. ER status: 99% positive and PR status 97% positive, both with strong staining intensity; Her2 status negative; Grade 3. -- Biopsy of a left axillary lymph node showed grade 3 invasive ductal carcinoma measuring at least 4 mm in the greatest linear extent. ER status: 99% positive and PR status 99% positive, both with strong staining intensity, Her2 status equivocal by IHC; Grade 3. -- Biopsy of the upper outer left breast, 8 cmfn, showed grade 3 invasive ductal carcinoma measuring at least 4 mm in the greatest linear extent of the sample with high grade DCIS. ER status: 99% positive with strong staining intensity; PR status negative (less than 1%), Her2 status negative; Grade 3.  Further staging work-up performed includes:  -- CT CAP with contrast on 10/14/22 which showed several left breast nodules, evidence of left axillary lymphadenopathy, and several non-enlarged left subpectoral lymph nodes concerning for early metastatic disease.  -- Whole body bone scan on 10/14/22 demonstrated no evidence of osseous metastatic disease.   She was then referred to Dr. Neil Crouch at [facility] Med[facility] and received neoadjuvant chemotherapy consisting of dose dense AC and weekly Taxol from 10/28/22 through 03/16/23.   She was then referred to [facility] general surgery and opted to proceed with bilateral simple mastectomies and left axillary SLN evaluation on 04/14/23 under the care of Dr. Lafayette Dragon. Pathology from the procedure revealed:  -- Left Breast: tumors x 2 the size of 9 mm and 8 mm; histology of two foci of residual invasive ductal carcinoma s/p  neoadjuvant chemotherapy (with the residual tumor spanning a tumor bed measuring approximately 4 cm); all final surgical margins negative for invasive carcinoma; margin status to invasive disease of >10 mm from the anterior  margin; nodal status of 4/4 left axillary lymph nodes negative for carcinoma, and 1/4 left axillary sentinel lymph node excisions positive for metastatic carcinoma (measuring approximately 5 mm in the greatest extent). ER status 99% positive; PR status 97% positive; Her2 negative.  -- Right Breast: benign breast tissue with at least 1 benign lymph node identified.   After she completes adjuvant radiation therapy, Dr. Neil Crouch has recommended adjuvant endocrine therapy with letrozole and adjuvant abemaciclib in the setting of her grade 3 tumor and nodal involvement.   Of note: The patient is postmenopausal given her history of a BSO. Based on this fact, Dr. Neil Crouch notes that she would be a candidate to receive AI.   Patient was feeling overwhelmed today with the treatment plan. She continues to deal with the residual side effects from her chemotherapy and surgery. 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 She denies any breast pain, lymphedema, or issues with range of motion today.   PREVIOUS RADIATION THERAPY: No  PAST MEDICAL HISTORY:  has a past medical history of Abnormal Pap smear (08/14/2003), Anemia, Bronchospasm, Carpal tunnel syndrome of right wrist, Diastasis recti (12/12/2008), GBS carrier, GERD, H/O chest pain (06/17/11, 06/2013), H/O varicella, Headache(784.0), Herpes, History of CT scan of chest, History of echocardiogram, History of kidney stones, History of stress test, HTN (hypertension), Hyperlipidemia, Hypertension, Low BMI, LV dysfunction (12/2012, 06/2013), Palpitations, Panic attack, Pelvic pain, Postpartum hypertension, Preeclampsia (1610,9604), Preterm contractions (06/15/2005), Right ovarian cyst (06/15/2001), Spotting in first trimester, Stress, and SVD (spontaneous vaginal delivery).    PAST SURGICAL HISTORY: Past Surgical History:  Procedure Laterality Date   breast cyst removal  06/16/2003   right   DILATION AND  CURETTAGE OF UTERUS  06/16/2007   DILITATION & CURRETTAGE/HYSTROSCOPY WITH THERMACHOICE ABLATION N/A 10/11/2013   Procedure: DILATATION & CURETTAGE/HYSTEROSCOPY WITH THERMACHOICE ABLATION;  Surgeon: Michael Litter, MD;  Location: [facility];  Service: Gynecology;  Laterality: N/A;   LAPAROSCOPIC OVARIAN CYSTECTOMY Left 04/25/2021   Procedure: LAPAROSCOPIC OVARIAN CYSTECTOMY and salpingectomy-oophorectomy;  Surgeon: Schermerhorn, Ihor Austin, MD;  Location: [facility] ORS;  Service: Gynecology;  Laterality: Left;   LAPAROSCOPY N/A 10/11/2013   Procedure: LAPAROSCOPY OPERATIVE WITH REMOVAL OF HYDROSALPINX ;  Surgeon: Michael Litter, MD;  Location: [facility];  Service: Gynecology;  Laterality: N/A;   ovary removed Right 2003   right - laparotomy   TUBAL LIGATION  07/03/2012   Procedure: POST PARTUM TUBAL LIGATION;  Surgeon: Purcell Nails, MD;  Location: [facility];  Service: Gynecology;  Laterality: Bilateral;  Bilateral post partum tubal ligation   WISDOM TOOTH EXTRACTION      FAMILY HISTORY: family history includes Alcohol abuse in her father and mother; Arrhythmia in her sister; Asthma in her mother and sister; COPD in her sister; Depression in her mother and sister; Diabetes in her maternal grandmother and mother; Drug abuse in her father and mother; Heart disease in her mother; Heart failure in her mother; Hypertension in her father, maternal grandmother, and mother; Kidney disease in her mother.  SOCIAL HISTORY:  reports that she has been smoking cigarettes. She has never used smokeless tobacco. She reports that she does not currently use alcohol. She reports current drug use. Drug: Marijuana.  ALLERGIES: Hydrochlorothiazide, Labetalol, and  Lisinopril  MEDICATIONS:  Current Outpatient Medications  Medication Sig Dispense Refill   amLODipine (NORVASC) 5 MG tablet Take 5 mg by mouth daily.     mirtazapine (REMERON) 30 MG tablet Take 30 mg by mouth daily.     nortriptyline (PAMELOR) 50 MG capsule Take 100  mg by mouth at bedtime.     pantoprazole (PROTONIX) 40 MG tablet Take 80 mg by mouth daily.     propranolol ER (INDERAL LA) 120 MG 24 hr capsule Take 120 mg by mouth daily.     No current facility-administered medications for this encounter.    REVIEW OF SYSTEMS: As above in HPI.   PHYSICAL EXAM:  vitals were not taken for this visit.   General: Alert and oriented, in no acute distress HEENT: Head is normocephalic. Extraocular movements are intact. Oropharynx is clear. Neck: Neck is supple, no palpable cervical or supraclavicular lymphadenopathy. Heart: Regular in rate and rhythm with no murmurs, rubs, or gallops. Chest: Clear to auscultation bilaterally, with no rhonchi, wheezes, or rales. Abdomen: Soft, nontender, nondistended, with no rigidity or guarding. Extremities: No cyanosis or edema. Lymphatics: see Neck Exam Skin: No concerning lesions. Musculoskeletal: symmetric strength and muscle tone throughout. Neurologic: Cranial nerves II through XII are grossly intact. No obvious focalities. Speech is fluent. Coordination is intact. Psychiatric: Judgment and insight are intact. Affect is appropriate. Breasts: Expanders in place. Mastectomy scars are well healed with no signs of delayed wound healing or infection. Skin edges are well approximated. No other palpable masses appreciated in the breasts or axillae.    ECOG = 0  0 - Asymptomatic (Fully active, able to carry on all predisease activities without restriction)  1 - Symptomatic but completely ambulatory (Restricted in physically strenuous activity but ambulatory and able to carry out work of a light or sedentary nature. For example, light housework, office work)  2 - Symptomatic, <50% in bed during the day (Ambulatory and capable of all self care but unable to carry out any work activities. Up and about more than 50% of waking hours)  3 - Symptomatic, >50% in bed, but not bedbound (Capable of only limited self-care, confined  to bed or chair 50% or more of waking hours)  4 - Bedbound (Completely disabled. Cannot carry on any self-care. Totally confined to bed or chair)  5 - Death   Santiago Glad MM, Creech RH, Tormey DC, et al. [phone]). "Toxicity and response criteria of the [facility] Group". Am. Evlyn Clines. Oncol. 5 (6): 649-55   LABORATORY DATA:  Lab Results  Component Value Date   WBC 8.7 04/20/2021   HGB 12.0 04/20/2021   HCT 37.3 04/20/2021   MCV 79.7 (L) 04/20/2021   PLT 298 04/20/2021   CMP     Component Value Date/Time   NA 138 04/20/2021 1905   K 3.5 04/20/2021 1905   CL 105 04/20/2021 1905   CO2 24 04/20/2021 1905   GLUCOSE 90 04/20/2021 1905   BUN 13 04/20/2021 1905   CREATININE 0.74 04/20/2021 1905   CREATININE 0.73 01/09/2013 1019   CALCIUM 9.0 04/20/2021 1905   PROT 7.1 04/20/2021 1905   ALBUMIN 3.7 04/20/2021 1905   AST 14 (L) 04/20/2021 1905   ALT 9 04/20/2021 1905   ALKPHOS 65 04/20/2021 1905   BILITOT 0.5 04/20/2021 1905   GFRNONAA >60 04/20/2021 1905   GFRNONAA >89 01/09/2013 1019   GFRAA >60 02/23/2019 1841   GFRAA >89 01/09/2013 1019      PATHOLOGY REPORT: 04/14/23  Final Diagnosis  [facility]  Sentinel lymph nodes, left axillary region, excision:              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              Metastatic deposit measures approximately 5 mm in greatest dimension.              Marked fibrosis.              Vision biopsy clip identified.              Negative for extracapsular extension.   2.   Sentinel lymph nodes, left axillary region, excision:              Two (2) lymph nodes are negative for malignancy.   3.   Breast, left, mastectomy:              Two (2) biopsy clips (buckle and top-hat) identified.              Two foci of residual invasive ductal carcinoma identified showing neoadjuvant therapy effect.              Tumors measure approximately 0.9 and 0.8 cm in greatest dimension.               Residual tumor spans a tumor bed of approximately 4 cm.              Final examined surgical margins appear free of involvement.              Pathologic stage: ypT1b pN1a              See synoptic report.               4.   Breast, right, simple mastectomy:              Benign breast tissue.              One (1) benign lymph node identified.   5.   Breast, left, new extended anterior margin:              Negative for malignancy.   6.   Lymph nodes, left axillary region, dissection:              Four (4) lymph nodes are negative for malignancy.                                            Electronically signed by Arvilla Market, MD on 04/19/2023 at 10:16 AM   Synoptic Report INVASIVE CARCINOMA OF THE BREAST: Resection INVASIVE CARCINOMA OF THE BREAST: RESECTION - 3 8th Edition - Protocol posted: 05/27/2022  SPECIMEN    Procedure:    Total mastectomy    Specimen Laterality:    Left  TUMOR    Tumor Site:    Upper outer quadrant    Histologic Type:    Invasive carcinoma of no special type (ductal)    Histologic Grade (Nottingham Histologic Score):          Glandular (Acinar) / Tubular Differentiation:    Score cannot be determined: The tumor has been altered by prior neoadjuvant therapy.      Nuclear Pleomorphism:    Score cannot be determined: The tumor has been altered by prior neoadjuvant  therapy.      Mitotic Rate:    Score cannot be determined: The tumor has been altered by prior neoadjuvant therapy.      Overall Grade:    Score cannot be determined: Marked neoadjuvant therapy effect is present.  The invasive component was grade 3 with the initial biopsy.    Tumor Size:    Greatest dimension of largest invasive focus (Millimeters): Residual foci are seen measuring up to 9 mm in greatest dimension. mm    Tumor Focality:    Multiple foci of invasive carcinoma      Number of Foci:    2      Sizes of Individual Foci in Millimeters (mm):    9;8    Ductal Carcinoma In Situ (DCIS):     Present      Size (Extent) of DCIS:    Cannot be determined    Lymphatic and / or Vascular Invasion:    Not identified    Dermal Lymphatic and / or Vascular Invasion:    Not identified    Treatment Effect in the Breast:    Probable or definite response to presurgical therapy in the invasive carcinoma    Treatment Effect in the Lymph Nodes:    Probable or definite response to presurgical therapy in metastatic carcinoma    Residual Cancer Burden (RCB) Calculation:          Primary Tumor Bed:            Greatest Dimension of Primary Tumor Bed Area (Millimeters):    40 mm        Second Greatest Dimension of Primary Tumor Bed Area (Millimeters):    40 mm        Percentage of Overall Cancer Cellularity:    5 %        Percentage of Cancer that is in situ Disease:    1 %      Lymph Nodes:            Number of Positive Lymph Nodes:    1        Diameter of Largest Nodal Metastasis (Millimeters):    5 mm      RCB Calculations:            Residual Cancer Burden:    2.887        Residual Cancer Burden Class:    RCB-II  MARGINS    Margin Status for Invasive Carcinoma:    All margins negative for invasive carcinoma      Distance from Invasive Carcinoma to Closest Margin:    Greater than: 10 mm      Closest Margin(s) to Invasive Carcinoma:    Anterior    Margin Status for DCIS:    All margins negative for DCIS      Distance from DCIS to Closest Margin:    Greater than: 10 mm  REGIONAL LYMPH NODES    Regional Lymph Node Status:          :    Tumor present in regional lymph node(s)        Number of Lymph Nodes with Macrometastases:    1        Number of Lymph Nodes with Micrometastases:    0        Number of Lymph Nodes with Isolated Tumor Cells:    0        Size of Largest Nodal Metastatic Deposit:    5 mm  Extranodal Extension:    Not identified      Total Number of Lymph Nodes Examined (sentinel and non-sentinel):    8      Number of Sentinel Nodes Examined:    4  pTNM CLASSIFICATION  (AJCC 8th Edition)    Reporting of pT, pN, and (when applicable) pM categories is based on information available to the pathologist at the time the report is issued. As per the AJCC (Chapter 1, 8th Ed.) it is the managing physician's responsibility to establish the final pathologic stage based upon all pertinent information, including but potentially not limited to this pathology report.    Modified Classification:    y    pT Category:    pT1b    T Suffix:    (m)    pN Category:    pN1a  SPECIAL STUDIES    Estrogen Receptor (ER) Status:    Positive (greater than 10% of cells demonstrate nuclear positivity)      Percentage of Cells with Nuclear Positivity:    99 %    Progesterone Receptor (PgR) Status:    Positive      Percentage of Cells with Nuclear Positivity:    97 %    HER2 (by immunohistochemistry):    Equivocal (Score 2+)    HER2 (by in situ hybridization):    Negative (not amplified)    Testing Performed on Case Number:    [id number]   Comment(s):    ER, PR and HER2 were also performed on parts 2 and 3. Please refer to the comment section for results.      RADIOGRAPHY: MM Outside Films Mammo  Result Date: 04/28/2023 This examination belongs to an outside facility and is stored here for comparison purposes only.  Contact the originating outside institution for any associated report or interpretation.     IMPRESSION/PLAN: Stage IIA Left Breast UOQ, Multifocal Invasive Ductal Carcinoma, ER+ / PR+ / Her2-, Grade 3: s/p neoadjuvant chemotherapy, followed by bilateral mastectomies with left axillary nodal evaluation - positive for nodal involvement and residual tumor s/p neoadjuvant chemotherapy    Cancer Staging  Malignant neoplasm of upper outer quadrant of female breast (HCC) Staging form: Breast, AJCC 8th Edition - Pathologic stage from 05/04/2023: ypT1b, pN1a(sn), cM0, G3, ER+, PR+, HER2- - Unsigned Stage prefix: Post-therapy Response to neoadjuvant therapy: Partial  response Method of lymph node assessment: Sentinel lymph node biopsy Multigene prognostic tests performed: Other Histologic grading system: 3 grade system   Patient has expanders in place. We will try to contact her plastic surgeon, Dr. Westley Foots, to coordinate treatment planning. However, patient also knows to call our office when her breast expanders have reached full expansion.  It was a pleasure meeting the patient today. We discussed the risks, benefits, and side effects of radiotherapy. We recommend radiotherapy to the left chest wall and surrounding lymph nodes to reduce her risk of locoregional recurrence by 2/3.  We discussed that radiation would take approximately 6 weeks (28 fractions, no scar boost)  to complete and that I would give the patient a few weeks to heal following surgery before starting treatment planning. We spoke about acute effects including skin irritation and fatigue as well as much less common late effects including internal organ injury or irritation. We spoke about the latest technology that is used to minimize the risk of late effects for patients undergoing radiotherapy to the breast or chest wall. No guarantees of treatment were given. The patient is enthusiastic about proceeding with  treatment. I look forward to participating in the patient's care.  I will await her referral back to me for postoperative follow-up and eventual CT simulation/treatment planning. She knows to call when she is ready.  CT scans from May 2024 requested to be powershared.  On date of service, in total, I spent 60 minutes on this encounter. Patient was seen in person.   __________________________________________    Bryan Lemma, PA-C   Lonie Peak, MD    [facility]  Radiation Oncology Direct Dial: [phone]  Fax: [phone] [email or web address]    This document serves as a record of services personally performed by Lonie Peak, MD and Bryan Lemma PA-C. It was created on  her behalf by Neena Rhymes, a trained medical scribe. The creation of this record is based on the scribe's personal observations and the provider's statements to them. This document has been checked and approved by the attending provider.

## 2023-05-11 ENCOUNTER — Ambulatory Visit
Admission: RE | Admit: 2023-05-11 | Discharge: 2023-05-11 | Disposition: A | Discharge status: Home / Self Care | Payer: BC Managed Care – PPO | Source: Ambulatory Visit | Attending: Radiation Oncology | Admitting: Radiation Oncology

## 2023-05-11 ENCOUNTER — Encounter: Payer: Self-pay | Admitting: Radiation Oncology

## 2023-05-11 DIAGNOSIS — Z79899 Other long term (current) drug therapy: Secondary | ICD-10-CM | POA: Insufficient documentation

## 2023-05-11 DIAGNOSIS — K219 Gastro-esophageal reflux disease without esophagitis: Secondary | ICD-10-CM | POA: Insufficient documentation

## 2023-05-11 DIAGNOSIS — C50412 Malignant neoplasm of upper-outer quadrant of left female breast: Secondary | ICD-10-CM | POA: Diagnosis present

## 2023-05-11 DIAGNOSIS — Z87442 Personal history of urinary calculi: Secondary | ICD-10-CM | POA: Diagnosis not present

## 2023-05-11 DIAGNOSIS — Z170 Estrogen receptor positive status [ER+]: Secondary | ICD-10-CM | POA: Insufficient documentation

## 2023-05-11 DIAGNOSIS — I10 Essential (primary) hypertension: Secondary | ICD-10-CM | POA: Diagnosis not present

## 2023-05-11 DIAGNOSIS — E785 Hyperlipidemia, unspecified: Secondary | ICD-10-CM | POA: Diagnosis not present

## 2023-05-11 DIAGNOSIS — Z90722 Acquired absence of ovaries, bilateral: Secondary | ICD-10-CM | POA: Insufficient documentation

## 2023-05-11 DIAGNOSIS — F17210 Nicotine dependence, cigarettes, uncomplicated: Secondary | ICD-10-CM | POA: Diagnosis not present

## 2023-05-11 DIAGNOSIS — C50419 Malignant neoplasm of upper-outer quadrant of unspecified female breast: Secondary | ICD-10-CM | POA: Insufficient documentation

## 2023-05-11 NOTE — Progress Notes (Signed)
Location of Breast Cancer: Malignant neoplasm of upper outer quadrant of left breast in female  estrogen receptor positive  Histology per Pathology Report:04/14/23   Receptor Status:     Past/Anticipated interventions by surgeon, if any:04/14/23   BILATERAL BREAST SIMPLE MASTECTOMY WITH LEFT SENTINEL LYMPH NODE BIOPSY AND AXILLARY DISSECTION  Mickel Crow, MD   Past/Anticipated interventions by medical oncology, if any: Chemotherapy   Lymphedema issues, if any:      Pain issues, if any:     SAFETY ISSUES: Prior radiation? no Pacemaker/ICD? No Possible current pregnancy? Having Cycles Is the patient on methotrexate? No  Current Complaints / other details:

## 2023-05-12 ENCOUNTER — Inpatient Hospital Stay
Admission: RE | Admit: 2023-05-12 | Discharge: 2023-05-12 | Disposition: A | Discharge status: Home / Self Care | Payer: Self-pay | Source: Ambulatory Visit | Attending: Radiation Oncology | Admitting: Radiation Oncology

## 2023-05-12 ENCOUNTER — Other Ambulatory Visit: Payer: Self-pay | Admitting: Radiation Oncology

## 2023-05-12 DIAGNOSIS — C50412 Malignant neoplasm of upper-outer quadrant of left female breast: Secondary | ICD-10-CM

## 2023-05-26 ENCOUNTER — Ambulatory Visit
Admission: RE | Admit: 2023-05-26 | Discharge: 2023-05-26 | Disposition: A | Discharge status: Home / Self Care | Payer: BC Managed Care – PPO | Source: Ambulatory Visit | Attending: Radiation Oncology | Admitting: Radiation Oncology

## 2023-05-26 DIAGNOSIS — Z170 Estrogen receptor positive status [ER+]: Secondary | ICD-10-CM | POA: Diagnosis not present

## 2023-05-26 DIAGNOSIS — Z510 Encounter for antineoplastic radiation therapy: Secondary | ICD-10-CM | POA: Insufficient documentation

## 2023-05-26 DIAGNOSIS — C50412 Malignant neoplasm of upper-outer quadrant of left female breast: Secondary | ICD-10-CM | POA: Insufficient documentation

## 2023-05-26 DIAGNOSIS — C773 Secondary and unspecified malignant neoplasm of axilla and upper limb lymph nodes: Secondary | ICD-10-CM | POA: Diagnosis not present

## 2023-05-31 ENCOUNTER — Telehealth: Payer: Self-pay | Admitting: Radiation Oncology

## 2023-05-31 DIAGNOSIS — Z510 Encounter for antineoplastic radiation therapy: Secondary | ICD-10-CM | POA: Diagnosis not present

## 2023-05-31 NOTE — Telephone Encounter (Signed)
12/16 @ 8:25 am Patient left voicemail with questions about her treatment appointment tomorrow.  Email forward to Support RTT and L2 machine so they are aware.

## 2023-06-01 ENCOUNTER — Ambulatory Visit
Admission: RE | Admit: 2023-06-01 | Discharge: 2023-06-01 | Disposition: A | Discharge status: Home / Self Care | Payer: BC Managed Care – PPO | Source: Ambulatory Visit | Attending: Radiation Oncology | Admitting: Radiation Oncology

## 2023-06-01 ENCOUNTER — Other Ambulatory Visit: Payer: Self-pay

## 2023-06-01 DIAGNOSIS — Z510 Encounter for antineoplastic radiation therapy: Secondary | ICD-10-CM | POA: Diagnosis not present

## 2023-06-01 LAB — RAD ONC ARIA SESSION SUMMARY

## 2023-06-02 ENCOUNTER — Other Ambulatory Visit: Payer: Self-pay

## 2023-06-02 ENCOUNTER — Ambulatory Visit
Admission: RE | Admit: 2023-06-02 | Discharge: 2023-06-02 | Disposition: A | Discharge status: Home / Self Care | Payer: BC Managed Care – PPO | Source: Ambulatory Visit | Attending: Radiation Oncology | Admitting: Radiation Oncology

## 2023-06-02 DIAGNOSIS — Z510 Encounter for antineoplastic radiation therapy: Secondary | ICD-10-CM | POA: Diagnosis not present

## 2023-06-02 LAB — RAD ONC ARIA SESSION SUMMARY

## 2023-06-03 ENCOUNTER — Ambulatory Visit
Admission: RE | Admit: 2023-06-03 | Discharge: 2023-06-03 | Disposition: A | Discharge status: Home / Self Care | Payer: BC Managed Care – PPO | Source: Ambulatory Visit | Attending: Radiation Oncology | Admitting: Radiation Oncology

## 2023-06-03 ENCOUNTER — Other Ambulatory Visit: Payer: Self-pay

## 2023-06-03 ENCOUNTER — Ambulatory Visit: Payer: BC Managed Care – PPO | Admitting: Radiation Oncology

## 2023-06-03 DIAGNOSIS — Z510 Encounter for antineoplastic radiation therapy: Secondary | ICD-10-CM | POA: Diagnosis not present

## 2023-06-03 LAB — RAD ONC ARIA SESSION SUMMARY
Course Elapsed Days: 2
Plan Fractions Treated to Date: 2
Plan Fractions Treated to Date: 3
Plan Prescribed Dose Per Fraction: 1.8 Gy
Plan Prescribed Dose Per Fraction: 1.8 Gy
Plan Total Fractions Prescribed: 14
Plan Total Fractions Prescribed: 28
Plan Total Prescribed Dose: 25.2 Gy
Plan Total Prescribed Dose: 50.4 Gy
Reference Point Dosage Given to Date: 3.6 Gy
Reference Point Dosage Given to Date: 5.4 Gy
Reference Point Session Dosage Given: 1.8 Gy
Reference Point Session Dosage Given: 1.8 Gy
Session Number: 3

## 2023-06-04 ENCOUNTER — Other Ambulatory Visit: Payer: Self-pay

## 2023-06-04 ENCOUNTER — Ambulatory Visit
Admission: RE | Admit: 2023-06-04 | Discharge: 2023-06-04 | Disposition: A | Discharge status: Home / Self Care | Payer: BC Managed Care – PPO | Source: Ambulatory Visit | Attending: Radiation Oncology | Admitting: Radiation Oncology

## 2023-06-04 DIAGNOSIS — Z510 Encounter for antineoplastic radiation therapy: Secondary | ICD-10-CM | POA: Diagnosis not present

## 2023-06-04 LAB — RAD ONC ARIA SESSION SUMMARY
Course Elapsed Days: 3
Plan Fractions Treated to Date: 2
Plan Fractions Treated to Date: 4
Plan Prescribed Dose Per Fraction: 1.8 Gy
Plan Prescribed Dose Per Fraction: 1.8 Gy
Plan Total Fractions Prescribed: 14
Plan Total Fractions Prescribed: 28
Plan Total Prescribed Dose: 25.2 Gy
Plan Total Prescribed Dose: 50.4 Gy
Reference Point Dosage Given to Date: 3.6 Gy
Reference Point Dosage Given to Date: 7.2 Gy
Reference Point Session Dosage Given: 1.8 Gy
Reference Point Session Dosage Given: 1.8 Gy
Session Number: 4

## 2023-06-07 ENCOUNTER — Ambulatory Visit
Admission: RE | Admit: 2023-06-07 | Discharge: 2023-06-07 | Disposition: A | Discharge status: Home / Self Care | Payer: BC Managed Care – PPO | Source: Ambulatory Visit | Attending: Radiation Oncology

## 2023-06-07 ENCOUNTER — Other Ambulatory Visit: Payer: Self-pay

## 2023-06-07 ENCOUNTER — Ambulatory Visit
Admission: RE | Admit: 2023-06-07 | Discharge: 2023-06-07 | Discharge status: Home / Self Care | Payer: BC Managed Care – PPO | Source: Ambulatory Visit | Attending: Radiation Oncology

## 2023-06-07 DIAGNOSIS — Z170 Estrogen receptor positive status [ER+]: Secondary | ICD-10-CM

## 2023-06-07 DIAGNOSIS — Z510 Encounter for antineoplastic radiation therapy: Secondary | ICD-10-CM | POA: Diagnosis not present

## 2023-06-07 LAB — RAD ONC ARIA SESSION SUMMARY
Course Elapsed Days: 6
Plan Fractions Treated to Date: 3
Plan Fractions Treated to Date: 5
Plan Prescribed Dose Per Fraction: 1.8 Gy
Plan Prescribed Dose Per Fraction: 1.8 Gy
Plan Total Fractions Prescribed: 14
Plan Total Fractions Prescribed: 28
Plan Total Prescribed Dose: 25.2 Gy
Plan Total Prescribed Dose: 50.4 Gy
Reference Point Dosage Given to Date: 5.4 Gy
Reference Point Dosage Given to Date: 9 Gy
Reference Point Session Dosage Given: 1.8 Gy
Reference Point Session Dosage Given: 1.8 Gy
Session Number: 5

## 2023-06-07 MED ORDER — ALRA NON-METALLIC DEODORANT (RAD-ONC)
1.0000 | Freq: Once | TOPICAL | Status: AC
  Administered 2023-06-07: 1 via TOPICAL

## 2023-06-07 MED ORDER — RADIAPLEXRX EX GEL: Freq: Once | CUTANEOUS | Status: AC

## 2023-06-08 ENCOUNTER — Ambulatory Visit
Admission: RE | Admit: 2023-06-08 | Discharge: 2023-06-08 | Disposition: A | Discharge status: Home / Self Care | Payer: BC Managed Care – PPO | Source: Ambulatory Visit | Attending: Radiation Oncology

## 2023-06-08 ENCOUNTER — Other Ambulatory Visit: Payer: Self-pay

## 2023-06-08 DIAGNOSIS — Z510 Encounter for antineoplastic radiation therapy: Secondary | ICD-10-CM | POA: Diagnosis not present

## 2023-06-08 LAB — RAD ONC ARIA SESSION SUMMARY
Course Elapsed Days: 7
Plan Fractions Treated to Date: 3
Plan Fractions Treated to Date: 6
Plan Prescribed Dose Per Fraction: 1.8 Gy
Plan Prescribed Dose Per Fraction: 1.8 Gy
Plan Total Fractions Prescribed: 14
Plan Total Fractions Prescribed: 28
Plan Total Prescribed Dose: 25.2 Gy
Plan Total Prescribed Dose: 50.4 Gy
Reference Point Dosage Given to Date: 10.8 Gy
Reference Point Dosage Given to Date: 5.4 Gy
Reference Point Session Dosage Given: 1.8 Gy
Reference Point Session Dosage Given: 1.8 Gy
Session Number: 6

## 2023-06-10 ENCOUNTER — Ambulatory Visit
Admission: RE | Admit: 2023-06-10 | Discharge: 2023-06-10 | Disposition: A | Discharge status: Home / Self Care | Payer: BC Managed Care – PPO | Source: Ambulatory Visit | Attending: Radiation Oncology | Admitting: Radiation Oncology

## 2023-06-10 ENCOUNTER — Other Ambulatory Visit: Payer: Self-pay

## 2023-06-10 DIAGNOSIS — Z510 Encounter for antineoplastic radiation therapy: Secondary | ICD-10-CM | POA: Diagnosis not present

## 2023-06-10 LAB — RAD ONC ARIA SESSION SUMMARY
Course Elapsed Days: 9
Plan Fractions Treated to Date: 4
Plan Fractions Treated to Date: 7
Plan Prescribed Dose Per Fraction: 1.8 Gy
Plan Prescribed Dose Per Fraction: 1.8 Gy
Plan Total Fractions Prescribed: 14
Plan Total Fractions Prescribed: 28
Plan Total Prescribed Dose: 25.2 Gy
Plan Total Prescribed Dose: 50.4 Gy
Reference Point Dosage Given to Date: 12.6 Gy
Reference Point Dosage Given to Date: 7.2 Gy
Reference Point Session Dosage Given: 1.8 Gy
Reference Point Session Dosage Given: 1.8 Gy
Session Number: 7

## 2023-06-11 ENCOUNTER — Ambulatory Visit
Admission: RE | Admit: 2023-06-11 | Discharge: 2023-06-11 | Disposition: A | Discharge status: Home / Self Care | Payer: BC Managed Care – PPO | Source: Ambulatory Visit | Attending: Radiation Oncology | Admitting: Radiation Oncology

## 2023-06-11 ENCOUNTER — Other Ambulatory Visit: Payer: Self-pay

## 2023-06-11 DIAGNOSIS — Z510 Encounter for antineoplastic radiation therapy: Secondary | ICD-10-CM | POA: Diagnosis not present

## 2023-06-11 LAB — RAD ONC ARIA SESSION SUMMARY
Course Elapsed Days: 10
Plan Fractions Treated to Date: 4
Plan Fractions Treated to Date: 8
Plan Prescribed Dose Per Fraction: 1.8 Gy
Plan Prescribed Dose Per Fraction: 1.8 Gy
Plan Total Fractions Prescribed: 14
Plan Total Fractions Prescribed: 28
Plan Total Prescribed Dose: 25.2 Gy
Plan Total Prescribed Dose: 50.4 Gy
Reference Point Dosage Given to Date: 14.4 Gy
Reference Point Dosage Given to Date: 7.2 Gy
Reference Point Session Dosage Given: 1.8 Gy
Reference Point Session Dosage Given: 1.8 Gy
Session Number: 8

## 2023-06-14 ENCOUNTER — Other Ambulatory Visit: Payer: Self-pay

## 2023-06-14 ENCOUNTER — Ambulatory Visit
Admission: RE | Admit: 2023-06-14 | Discharge: 2023-06-14 | Disposition: A | Discharge status: Home / Self Care | Payer: BC Managed Care – PPO | Source: Ambulatory Visit | Attending: Radiation Oncology | Admitting: Radiation Oncology

## 2023-06-14 DIAGNOSIS — Z510 Encounter for antineoplastic radiation therapy: Secondary | ICD-10-CM | POA: Diagnosis not present

## 2023-06-14 LAB — RAD ONC ARIA SESSION SUMMARY
Course Elapsed Days: 13
Plan Fractions Treated to Date: 5
Plan Fractions Treated to Date: 9
Plan Prescribed Dose Per Fraction: 1.8 Gy
Plan Prescribed Dose Per Fraction: 1.8 Gy
Plan Total Fractions Prescribed: 14
Plan Total Fractions Prescribed: 28
Plan Total Prescribed Dose: 25.2 Gy
Plan Total Prescribed Dose: 50.4 Gy
Reference Point Dosage Given to Date: 16.2 Gy
Reference Point Dosage Given to Date: 9 Gy
Reference Point Session Dosage Given: 1.8 Gy
Reference Point Session Dosage Given: 1.8 Gy
Session Number: 9

## 2023-06-15 ENCOUNTER — Other Ambulatory Visit: Payer: Self-pay

## 2023-06-15 ENCOUNTER — Ambulatory Visit
Admission: RE | Admit: 2023-06-15 | Discharge: 2023-06-15 | Disposition: A | Discharge status: Home / Self Care | Payer: BC Managed Care – PPO | Source: Ambulatory Visit | Attending: Radiation Oncology | Admitting: Radiation Oncology

## 2023-06-15 DIAGNOSIS — Z510 Encounter for antineoplastic radiation therapy: Secondary | ICD-10-CM | POA: Diagnosis not present

## 2023-06-15 LAB — RAD ONC ARIA SESSION SUMMARY
Course Elapsed Days: 14
Plan Fractions Treated to Date: 10
Plan Fractions Treated to Date: 5
Plan Prescribed Dose Per Fraction: 1.8 Gy
Plan Prescribed Dose Per Fraction: 1.8 Gy
Plan Total Fractions Prescribed: 14
Plan Total Fractions Prescribed: 28
Plan Total Prescribed Dose: 25.2 Gy
Plan Total Prescribed Dose: 50.4 Gy
Reference Point Dosage Given to Date: 18 Gy
Reference Point Dosage Given to Date: 9 Gy
Reference Point Session Dosage Given: 1.8 Gy
Reference Point Session Dosage Given: 1.8 Gy
Session Number: 10

## 2023-06-17 ENCOUNTER — Ambulatory Visit
Admission: RE | Admit: 2023-06-17 | Discharge: 2023-06-17 | Disposition: A | Discharge status: Home / Self Care | Payer: BC Managed Care – PPO | Source: Ambulatory Visit | Attending: Radiation Oncology | Admitting: Radiation Oncology

## 2023-06-17 ENCOUNTER — Other Ambulatory Visit: Payer: Self-pay

## 2023-06-17 DIAGNOSIS — R079 Chest pain, unspecified: Secondary | ICD-10-CM | POA: Insufficient documentation

## 2023-06-17 DIAGNOSIS — Z170 Estrogen receptor positive status [ER+]: Secondary | ICD-10-CM | POA: Diagnosis present

## 2023-06-17 DIAGNOSIS — C50412 Malignant neoplasm of upper-outer quadrant of left female breast: Secondary | ICD-10-CM | POA: Diagnosis present

## 2023-06-17 LAB — RAD ONC ARIA SESSION SUMMARY
Course Elapsed Days: 16
Plan Fractions Treated to Date: 11
Plan Fractions Treated to Date: 6
Plan Prescribed Dose Per Fraction: 1.8 Gy
Plan Prescribed Dose Per Fraction: 1.8 Gy
Plan Total Fractions Prescribed: 14
Plan Total Fractions Prescribed: 28
Plan Total Prescribed Dose: 25.2 Gy
Plan Total Prescribed Dose: 50.4 Gy
Reference Point Dosage Given to Date: 10.8 Gy
Reference Point Dosage Given to Date: 19.8 Gy
Reference Point Session Dosage Given: 1.8 Gy
Reference Point Session Dosage Given: 1.8 Gy
Session Number: 11

## 2023-06-18 ENCOUNTER — Telehealth: Payer: Self-pay

## 2023-06-18 ENCOUNTER — Ambulatory Visit: Payer: BC Managed Care – PPO

## 2023-06-18 NOTE — Telephone Encounter (Signed)
 Patient called in requesting to speak with a nurse. Patient reports swelling and pain to bilateral feet and legs. Patient also reports dizziness and chest pain. Per patient she thinks these side effects are residual from her chemotherapy. Offered patient appointment in the clinic today. Patient states she will wait until Monday to see Dr. Izell. Encouraged patient to be seen in the ED if symptoms worsen. Patient voiced understand

## 2023-06-21 ENCOUNTER — Other Ambulatory Visit: Payer: Self-pay

## 2023-06-21 ENCOUNTER — Ambulatory Visit
Admission: RE | Admit: 2023-06-21 | Discharge: 2023-06-21 | Disposition: A | Discharge status: Home / Self Care | Payer: BC Managed Care – PPO | Source: Ambulatory Visit | Attending: Radiation Oncology | Admitting: Radiation Oncology

## 2023-06-21 ENCOUNTER — Ambulatory Visit: Payer: BC Managed Care – PPO | Admitting: Radiation Oncology

## 2023-06-21 DIAGNOSIS — R079 Chest pain, unspecified: Secondary | ICD-10-CM | POA: Diagnosis not present

## 2023-06-21 DIAGNOSIS — C50412 Malignant neoplasm of upper-outer quadrant of left female breast: Secondary | ICD-10-CM

## 2023-06-21 LAB — RAD ONC ARIA SESSION SUMMARY
Course Elapsed Days: 20
Plan Fractions Treated to Date: 12
Plan Fractions Treated to Date: 6
Plan Prescribed Dose Per Fraction: 1.8 Gy
Plan Prescribed Dose Per Fraction: 1.8 Gy
Plan Total Fractions Prescribed: 14
Plan Total Fractions Prescribed: 28
Plan Total Prescribed Dose: 25.2 Gy
Plan Total Prescribed Dose: 50.4 Gy
Reference Point Dosage Given to Date: 10.8 Gy
Reference Point Dosage Given to Date: 21.6 Gy
Reference Point Session Dosage Given: 1.8 Gy
Reference Point Session Dosage Given: 1.8 Gy
Session Number: 12

## 2023-06-22 ENCOUNTER — Telehealth: Payer: Self-pay | Admitting: *Deleted

## 2023-06-22 ENCOUNTER — Ambulatory Visit: Payer: BC Managed Care – PPO

## 2023-06-22 ENCOUNTER — Other Ambulatory Visit: Payer: Self-pay | Admitting: Nurse Practitioner

## 2023-06-22 NOTE — Telephone Encounter (Signed)
 Called patient to inform of MRI and CT scheduled for 07-08-23- arrival time- 12:45 pm @ [facility]- address [address] ph. [phone], no restrictions to scan, lvm for a return call

## 2023-06-22 NOTE — Telephone Encounter (Signed)
 CALLED PATIENT TO INFORM OF MRI AND CT BEING MOVED TO 07-09-23- ARRIVAL TIME- 11:15 AM @ [facility] - ADDRESS- [id number] [place], PHONE NUMBER - [phone], NO RESTRICTIONS TO SCAN, SPOKE WITH PATIENT AND SHE IS AWARE OF THESE SCANS AND THE INSTRUCTIONS

## 2023-06-23 ENCOUNTER — Other Ambulatory Visit: Payer: Self-pay

## 2023-06-23 ENCOUNTER — Ambulatory Visit
Admission: RE | Admit: 2023-06-23 | Discharge: 2023-06-23 | Disposition: A | Discharge status: Home / Self Care | Payer: BC Managed Care – PPO | Source: Ambulatory Visit | Attending: Radiation Oncology | Admitting: Radiation Oncology

## 2023-06-23 DIAGNOSIS — R079 Chest pain, unspecified: Secondary | ICD-10-CM | POA: Diagnosis not present

## 2023-06-23 LAB — RAD ONC ARIA SESSION SUMMARY
Course Elapsed Days: 22
Plan Fractions Treated to Date: 13
Plan Fractions Treated to Date: 7
Plan Prescribed Dose Per Fraction: 1.8 Gy
Plan Prescribed Dose Per Fraction: 1.8 Gy
Plan Total Fractions Prescribed: 14
Plan Total Fractions Prescribed: 28
Plan Total Prescribed Dose: 25.2 Gy
Plan Total Prescribed Dose: 50.4 Gy
Reference Point Dosage Given to Date: 12.6 Gy
Reference Point Dosage Given to Date: 23.4 Gy
Reference Point Session Dosage Given: 1.8 Gy
Reference Point Session Dosage Given: 1.8 Gy
Session Number: 13

## 2023-06-24 ENCOUNTER — Ambulatory Visit
Admission: RE | Admit: 2023-06-24 | Discharge: 2023-06-24 | Disposition: A | Discharge status: Home / Self Care | Payer: BC Managed Care – PPO | Source: Ambulatory Visit | Attending: Radiation Oncology | Admitting: Radiation Oncology

## 2023-06-24 ENCOUNTER — Other Ambulatory Visit: Payer: Self-pay

## 2023-06-24 DIAGNOSIS — R079 Chest pain, unspecified: Secondary | ICD-10-CM | POA: Diagnosis not present

## 2023-06-24 LAB — RAD ONC ARIA SESSION SUMMARY
Course Elapsed Days: 23
Plan Fractions Treated to Date: 14
Plan Fractions Treated to Date: 7
Plan Prescribed Dose Per Fraction: 1.8 Gy
Plan Prescribed Dose Per Fraction: 1.8 Gy
Plan Total Fractions Prescribed: 14
Plan Total Fractions Prescribed: 28
Plan Total Prescribed Dose: 25.2 Gy
Plan Total Prescribed Dose: 50.4 Gy
Reference Point Dosage Given to Date: 12.6 Gy
Reference Point Dosage Given to Date: 25.2 Gy
Reference Point Session Dosage Given: 1.8 Gy
Reference Point Session Dosage Given: 1.8 Gy
Session Number: 14

## 2023-06-25 ENCOUNTER — Ambulatory Visit: Payer: BC Managed Care – PPO

## 2023-06-28 ENCOUNTER — Ambulatory Visit: Payer: BC Managed Care – PPO

## 2023-06-28 ENCOUNTER — Ambulatory Visit
Admission: RE | Admit: 2023-06-28 | Discharge: 2023-06-28 | Disposition: A | Discharge status: Home / Self Care | Payer: BC Managed Care – PPO | Source: Ambulatory Visit | Attending: Radiation Oncology

## 2023-06-28 ENCOUNTER — Other Ambulatory Visit: Payer: Self-pay

## 2023-06-28 DIAGNOSIS — R079 Chest pain, unspecified: Secondary | ICD-10-CM | POA: Diagnosis not present

## 2023-06-28 LAB — RAD ONC ARIA SESSION SUMMARY
Course Elapsed Days: 27
Plan Fractions Treated to Date: 15
Plan Fractions Treated to Date: 8
Plan Prescribed Dose Per Fraction: 1.8 Gy
Plan Prescribed Dose Per Fraction: 1.8 Gy
Plan Total Fractions Prescribed: 14
Plan Total Fractions Prescribed: 28
Plan Total Prescribed Dose: 25.2 Gy
Plan Total Prescribed Dose: 50.4 Gy
Reference Point Dosage Given to Date: 14.4 Gy
Reference Point Dosage Given to Date: 27 Gy
Reference Point Session Dosage Given: 1.8 Gy
Reference Point Session Dosage Given: 1.8 Gy
Session Number: 15

## 2023-06-29 ENCOUNTER — Ambulatory Visit
Admission: RE | Admit: 2023-06-29 | Discharge: 2023-06-29 | Disposition: A | Discharge status: Home / Self Care | Payer: BC Managed Care – PPO | Source: Ambulatory Visit | Attending: Radiation Oncology | Admitting: Radiation Oncology

## 2023-06-29 ENCOUNTER — Other Ambulatory Visit: Payer: Self-pay

## 2023-06-29 DIAGNOSIS — R079 Chest pain, unspecified: Secondary | ICD-10-CM | POA: Diagnosis not present

## 2023-06-29 LAB — RAD ONC ARIA SESSION SUMMARY
Course Elapsed Days: 28
Plan Fractions Treated to Date: 16
Plan Fractions Treated to Date: 8
Plan Prescribed Dose Per Fraction: 1.8 Gy
Plan Prescribed Dose Per Fraction: 1.8 Gy
Plan Total Fractions Prescribed: 14
Plan Total Fractions Prescribed: 28
Plan Total Prescribed Dose: 25.2 Gy
Plan Total Prescribed Dose: 50.4 Gy
Reference Point Dosage Given to Date: 14.4 Gy
Reference Point Dosage Given to Date: 28.8 Gy
Reference Point Session Dosage Given: 1.8 Gy
Reference Point Session Dosage Given: 1.8 Gy
Session Number: 16

## 2023-06-30 ENCOUNTER — Ambulatory Visit: Payer: BC Managed Care – PPO

## 2023-07-01 ENCOUNTER — Ambulatory Visit
Admission: RE | Admit: 2023-07-01 | Discharge: 2023-07-01 | Disposition: A | Discharge status: Home / Self Care | Payer: BC Managed Care – PPO | Source: Ambulatory Visit | Attending: Radiation Oncology | Admitting: Radiation Oncology

## 2023-07-01 ENCOUNTER — Other Ambulatory Visit: Payer: Self-pay

## 2023-07-01 DIAGNOSIS — R079 Chest pain, unspecified: Secondary | ICD-10-CM | POA: Diagnosis not present

## 2023-07-01 LAB — RAD ONC ARIA SESSION SUMMARY
Course Elapsed Days: 30
Plan Fractions Treated to Date: 17
Plan Fractions Treated to Date: 9
Plan Prescribed Dose Per Fraction: 1.8 Gy
Plan Prescribed Dose Per Fraction: 1.8 Gy
Plan Total Fractions Prescribed: 14
Plan Total Fractions Prescribed: 28
Plan Total Prescribed Dose: 25.2 Gy
Plan Total Prescribed Dose: 50.4 Gy
Reference Point Dosage Given to Date: 16.2 Gy
Reference Point Dosage Given to Date: 30.6 Gy
Reference Point Session Dosage Given: 1.8 Gy
Reference Point Session Dosage Given: 1.8 Gy
Session Number: 17

## 2023-07-02 ENCOUNTER — Telehealth: Payer: Self-pay | Admitting: *Deleted

## 2023-07-02 ENCOUNTER — Ambulatory Visit
Admission: RE | Admit: 2023-07-02 | Discharge: 2023-07-02 | Disposition: A | Discharge status: Home / Self Care | Payer: BC Managed Care – PPO | Source: Ambulatory Visit | Attending: Radiation Oncology

## 2023-07-02 ENCOUNTER — Other Ambulatory Visit: Payer: Self-pay

## 2023-07-02 ENCOUNTER — Ambulatory Visit: Payer: BC Managed Care – PPO

## 2023-07-02 DIAGNOSIS — R079 Chest pain, unspecified: Secondary | ICD-10-CM | POA: Diagnosis not present

## 2023-07-02 DIAGNOSIS — Z170 Estrogen receptor positive status [ER+]: Secondary | ICD-10-CM

## 2023-07-02 LAB — RAD ONC ARIA SESSION SUMMARY
Course Elapsed Days: 31
Plan Fractions Treated to Date: 18
Plan Fractions Treated to Date: 9
Plan Prescribed Dose Per Fraction: 1.8 Gy
Plan Prescribed Dose Per Fraction: 1.8 Gy
Plan Total Fractions Prescribed: 14
Plan Total Fractions Prescribed: 28
Plan Total Prescribed Dose: 25.2 Gy
Plan Total Prescribed Dose: 50.4 Gy
Reference Point Dosage Given to Date: 16.2 Gy
Reference Point Dosage Given to Date: 32.4 Gy
Reference Point Session Dosage Given: 1.8 Gy
Reference Point Session Dosage Given: 1.8 Gy
Session Number: 18

## 2023-07-02 NOTE — Telephone Encounter (Signed)
CALLED PATIENT TO INFORM OF CT FOR 07/14/23- ARRIVAL TIME- 11 AM @ [facility]- [address], [place], NO RESTRICTIONS TO SCAN, PATIENT TO BRING INSURANCE CARD AND PHOTO ID, SPOKE WITH PATIENT AND SHE IS AWARE OF THIS APPT. AND THE INSTRUCTIONS

## 2023-07-05 ENCOUNTER — Ambulatory Visit: Payer: BC Managed Care – PPO

## 2023-07-06 ENCOUNTER — Ambulatory Visit
Admission: RE | Admit: 2023-07-06 | Discharge: 2023-07-06 | Disposition: A | Discharge status: Home / Self Care | Payer: BC Managed Care – PPO | Source: Ambulatory Visit | Attending: Radiation Oncology | Admitting: Radiation Oncology

## 2023-07-06 ENCOUNTER — Other Ambulatory Visit: Payer: Self-pay

## 2023-07-06 DIAGNOSIS — R079 Chest pain, unspecified: Secondary | ICD-10-CM | POA: Diagnosis not present

## 2023-07-06 LAB — RAD ONC ARIA SESSION SUMMARY
Course Elapsed Days: 35
Plan Fractions Treated to Date: 10
Plan Fractions Treated to Date: 19
Plan Prescribed Dose Per Fraction: 1.8 Gy
Plan Prescribed Dose Per Fraction: 1.8 Gy
Plan Total Fractions Prescribed: 14
Plan Total Fractions Prescribed: 28
Plan Total Prescribed Dose: 25.2 Gy
Plan Total Prescribed Dose: 50.4 Gy
Reference Point Dosage Given to Date: 18 Gy
Reference Point Dosage Given to Date: 34.2 Gy
Reference Point Session Dosage Given: 1.8 Gy
Reference Point Session Dosage Given: 1.8 Gy
Session Number: 19

## 2023-07-06 NOTE — Progress Notes (Deleted)
Palliative Medicine [facility]  [phone] [phone]) [phone]   Name: [name] Date: 07/06/2023 MRN: [id number]  DOB: 1985/12/18  Patient Care Team: Stevphen Rochester, MD as PCP - General (Family Medicine)    REASON FOR CONSULTATION: [name] is a 38 y.o. female with oncologic medical history including breast cancer (09/2022). Palliative ask to see for symptom management and goals of care.    SOCIAL HISTORY:     reports that she has been smoking cigarettes. She has never used smokeless tobacco. She reports that she does not currently use alcohol. She reports current drug use. Drug: Marijuana.  ADVANCE DIRECTIVES:  None on file  CODE STATUS: Full code  PAST MEDICAL HISTORY: Past Medical History:  Diagnosis Date   Abnormal Pap smear 08/14/2003   colpo   Anemia    History -FeSO4 SUPP IN PAST   Bronchospasm    Carpal tunnel syndrome of right wrist    Diastasis recti 12/12/2008   GBS carrier    First pregnancy   GERD    zantac   H/O chest pain 06/17/11, 06/2013   Resolved 06/2013   H/O varicella    Headache(784.0)    migraines;D/T BP   Herpes    cold sores   History of CT scan of chest    a. Cardiac CTA 10/16: no CAD   History of echocardiogram    a. Echo 10/16: EF 50%, diff HK, mild TR, mild PI   History of kidney stones    passed stone - no surgery required   History of stress test    a. ETT-Echo 10/16: normal ECGs, + LAD area ischemia >> will arrange Cardiac CTA   HTN (hypertension)    Hyperlipidemia    Hypertension    Low BMI    LV dysfunction 12/2012, 06/2013   Hx: EF 45-50%, Resolved by Jan 2015 echo- EF 50-55%   Palpitations    Hx only - no problems   Panic attack    HAS BEEN RX'D XANAX   Pelvic pain    With pregnancy   Postpartum hypertension    Preeclampsia 2010,2011   POST PARTUM;WAS PUT ON BP MEDS   Preterm contractions 06/15/2005   GIVEN BETAMETHASONE AND PROCARDIA TO STOP CTXS   Right ovarian  cyst 06/15/2001   RT OOPHRECTOMY   Spotting in first trimester    With SAB   Stress    work and at home - no meds   SVD (spontaneous vaginal delivery)    x 5    PAST SURGICAL HISTORY:  Past Surgical History:  Procedure Laterality Date   breast cyst removal  06/16/2003   right   DILATION AND CURETTAGE OF UTERUS  06/16/2007   DILITATION & CURRETTAGE/HYSTROSCOPY WITH THERMACHOICE ABLATION N/A 10/11/2013   Procedure: DILATATION & CURETTAGE/HYSTEROSCOPY WITH THERMACHOICE ABLATION;  Surgeon: Michael Litter, MD;  Location: [facility];  Service: Gynecology;  Laterality: N/A;   LAPAROSCOPIC OVARIAN CYSTECTOMY Left 04/25/2021   Procedure: LAPAROSCOPIC OVARIAN CYSTECTOMY and salpingectomy-oophorectomy;  Surgeon: Schermerhorn, Ihor Austin, MD;  Location: [facility];  Service: Gynecology;  Laterality: Left;   LAPAROSCOPY N/A 10/11/2013   Procedure: LAPAROSCOPY OPERATIVE WITH REMOVAL OF HYDROSALPINX ;  Surgeon: Michael Litter, MD;  Location: [facility];  Service: Gynecology;  Laterality: N/A;   ovary removed Right 2003   right - laparotomy   TUBAL LIGATION  07/03/2012   Procedure: POST PARTUM TUBAL LIGATION;  Surgeon: Purcell Nails, MD;  Location:  [facility];  Service: Gynecology;  Laterality: Bilateral;  Bilateral post partum tubal ligation   WISDOM TOOTH EXTRACTION      HEMATOLOGY/ONCOLOGY HISTORY:  Oncology History   No history exists.    ALLERGIES:  is allergic to hydrochlorothiazide, labetalol, and lisinopril.  MEDICATIONS:  Current Outpatient Medications  Medication Sig Dispense Refill   amLODipine (NORVASC) 5 MG tablet Take 5 mg by mouth daily.     mirtazapine (REMERON) 30 MG tablet Take 30 mg by mouth daily.     nortriptyline (PAMELOR) 50 MG capsule Take 100 mg by mouth at bedtime.     pantoprazole (PROTONIX) 40 MG tablet Take 80 mg by mouth daily.     propranolol ER (INDERAL LA) 120 MG 24 hr capsule Take 120 mg by mouth daily.     No current facility-administered medications for this  visit.    VITAL SIGNS: There were no vitals taken for this visit. There were no vitals filed for this visit.  Estimated body mass index is 28.8 kg/m as calculated from the following:   Height as of 04/25/21: 5\' 5"  (1.651 m).   Weight as of 04/25/21: 173 lb 1 oz (78.5 kg).  LABS: CBC:    Component Value Date/Time   WBC 8.7 04/20/2021 1905   HGB 12.0 04/20/2021 1905   HCT 37.3 04/20/2021 1905   PLT 298 04/20/2021 1905   MCV 79.7 (L) 04/20/2021 1905   NEUTROABS 6.6 02/23/2019 1841   LYMPHSABS 1.9 02/23/2019 1841   MONOABS 0.8 02/23/2019 1841   EOSABS 0.2 02/23/2019 1841   BASOSABS 0.1 02/23/2019 1841   Comprehensive Metabolic Panel:    Component Value Date/Time   NA 138 04/20/2021 1905   K 3.5 04/20/2021 1905   CL 105 04/20/2021 1905   CO2 24 04/20/2021 1905   BUN 13 04/20/2021 1905   CREATININE 0.74 04/20/2021 1905   CREATININE 0.73 01/09/2013 1019   GLUCOSE 90 04/20/2021 1905   CALCIUM 9.0 04/20/2021 1905   AST 14 (L) 04/20/2021 1905   ALT 9 04/20/2021 1905   ALKPHOS 65 04/20/2021 1905   BILITOT 0.5 04/20/2021 1905   PROT 7.1 04/20/2021 1905   ALBUMIN 3.7 04/20/2021 1905    RADIOGRAPHIC STUDIES: No results found.  PERFORMANCE STATUS (ECOG) : {CHL ONC ECOG WU:[id number]}  Review of Systems Unless otherwise noted, a complete review of systems is negative.  Physical Exam General: NAD Cardiovascular: regular rate and rhythm Pulmonary: clear ant fields Abdomen: soft, nontender, + bowel sounds Extremities: no edema, no joint deformities Skin: no rashes Neurological: Alert and oriented x3  IMPRESSION: *** I introduced myself, Islam Eichinger RN, and Palliative's role in collaboration with the oncology team. Concept of Palliative Care was introduced as specialized medical care for people and their families living with serious illness.  It focuses on providing relief from the symptoms and stress of a serious illness.  The goal is to improve quality of life for both the  patient and the family. Values and goals of care important to patient and family were attempted to be elicited.    We discussed *** current illness and what it means in the larger context of *** on-going co-morbidities. Natural disease trajectory and expectations were discussed.  I discussed the importance of continued conversation with family and their medical providers regarding overall plan of care and treatment options, ensuring decisions are within the context of the patients values and GOCs.  PLAN: Established therapeutic relationship. Education provided on palliative's role in collaboration with their Oncology/Radiation team.  I will plan to see patient back in 2-4 weeks in collaboration to other oncology appointments.    Patient expressed understanding and was in agreement with this plan. She also understands that She can call the clinic at any time with any questions, concerns, or complaints.   Thank you for your referral and allowing Palliative to assist in [name]'s care.   Number and complexity of problems addressed: ***HIGH - 1 or more chronic illnesses with SEVERE exacerbation, progression, or side effects of treatment - advanced cancer, pain. Any controlled substances utilized were prescribed in the context of palliative care.   Visit consisted of counseling and education dealing with the complex and emotionally intense issues of symptom management and palliative care in the setting of serious and potentially life-threatening illness.  Signed by: Willette Alma, AGPCNP-BC Palliative Medicine Team/[facility]

## 2023-07-07 ENCOUNTER — Inpatient Hospital Stay: Payer: BC Managed Care – PPO | Attending: Nurse Practitioner | Admitting: Nurse Practitioner

## 2023-07-07 ENCOUNTER — Other Ambulatory Visit: Payer: Self-pay

## 2023-07-07 ENCOUNTER — Ambulatory Visit
Admission: RE | Admit: 2023-07-07 | Discharge: 2023-07-07 | Disposition: A | Discharge status: Home / Self Care | Payer: BC Managed Care – PPO | Source: Ambulatory Visit | Attending: Radiation Oncology | Admitting: Radiation Oncology

## 2023-07-07 DIAGNOSIS — R079 Chest pain, unspecified: Secondary | ICD-10-CM | POA: Diagnosis not present

## 2023-07-07 LAB — RAD ONC ARIA SESSION SUMMARY
Course Elapsed Days: 36
Plan Fractions Treated to Date: 10
Plan Fractions Treated to Date: 20
Plan Prescribed Dose Per Fraction: 1.8 Gy
Plan Prescribed Dose Per Fraction: 1.8 Gy
Plan Total Fractions Prescribed: 14
Plan Total Fractions Prescribed: 28
Plan Total Prescribed Dose: 25.2 Gy
Plan Total Prescribed Dose: 50.4 Gy
Reference Point Dosage Given to Date: 18 Gy
Reference Point Dosage Given to Date: 36 Gy
Reference Point Session Dosage Given: 1.8 Gy
Reference Point Session Dosage Given: 1.8 Gy
Session Number: 20

## 2023-07-08 ENCOUNTER — Other Ambulatory Visit: Payer: Self-pay

## 2023-07-08 ENCOUNTER — Ambulatory Visit
Admission: RE | Admit: 2023-07-08 | Discharge: 2023-07-08 | Disposition: A | Discharge status: Home / Self Care | Payer: BC Managed Care – PPO | Source: Ambulatory Visit | Attending: Radiation Oncology | Admitting: Radiation Oncology

## 2023-07-08 ENCOUNTER — Telehealth: Payer: Self-pay | Admitting: Nurse Practitioner

## 2023-07-08 DIAGNOSIS — R079 Chest pain, unspecified: Secondary | ICD-10-CM | POA: Diagnosis not present

## 2023-07-08 LAB — RAD ONC ARIA SESSION SUMMARY
Course Elapsed Days: 37
Plan Fractions Treated to Date: 11
Plan Fractions Treated to Date: 21
Plan Prescribed Dose Per Fraction: 1.8 Gy
Plan Prescribed Dose Per Fraction: 1.8 Gy
Plan Total Fractions Prescribed: 14
Plan Total Fractions Prescribed: 28
Plan Total Prescribed Dose: 25.2 Gy
Plan Total Prescribed Dose: 50.4 Gy
Reference Point Dosage Given to Date: 19.8 Gy
Reference Point Dosage Given to Date: 37.8 Gy
Reference Point Session Dosage Given: 1.8 Gy
Reference Point Session Dosage Given: 1.8 Gy
Session Number: 21

## 2023-07-08 NOTE — Telephone Encounter (Signed)
Called patient to reschedule her missed palliative care appointment and the patient stated she no longer wants the appointment.

## 2023-07-09 ENCOUNTER — Ambulatory Visit
Admission: RE | Admit: 2023-07-09 | Discharge: 2023-07-09 | Disposition: A | Discharge status: Home / Self Care | Payer: BC Managed Care – PPO | Source: Ambulatory Visit | Attending: Radiation Oncology | Admitting: Radiation Oncology

## 2023-07-09 ENCOUNTER — Other Ambulatory Visit: Payer: Self-pay

## 2023-07-09 DIAGNOSIS — R079 Chest pain, unspecified: Secondary | ICD-10-CM | POA: Diagnosis not present

## 2023-07-09 LAB — RAD ONC ARIA SESSION SUMMARY
Course Elapsed Days: 38
Plan Fractions Treated to Date: 11
Plan Fractions Treated to Date: 22
Plan Prescribed Dose Per Fraction: 1.8 Gy
Plan Prescribed Dose Per Fraction: 1.8 Gy
Plan Total Fractions Prescribed: 14
Plan Total Fractions Prescribed: 28
Plan Total Prescribed Dose: 25.2 Gy
Plan Total Prescribed Dose: 50.4 Gy
Reference Point Dosage Given to Date: 19.8 Gy
Reference Point Dosage Given to Date: 39.6 Gy
Reference Point Session Dosage Given: 1.8 Gy
Reference Point Session Dosage Given: 1.8 Gy
Session Number: 22

## 2023-07-12 ENCOUNTER — Ambulatory Visit: Payer: BC Managed Care – PPO

## 2023-07-12 ENCOUNTER — Other Ambulatory Visit: Payer: Self-pay

## 2023-07-12 ENCOUNTER — Ambulatory Visit
Admission: RE | Admit: 2023-07-12 | Discharge: 2023-07-12 | Disposition: A | Discharge status: Home / Self Care | Payer: BC Managed Care – PPO | Source: Ambulatory Visit | Attending: Radiation Oncology | Admitting: Radiation Oncology

## 2023-07-12 DIAGNOSIS — R079 Chest pain, unspecified: Secondary | ICD-10-CM | POA: Diagnosis not present

## 2023-07-12 LAB — RAD ONC ARIA SESSION SUMMARY
Course Elapsed Days: 41
Plan Fractions Treated to Date: 12
Plan Fractions Treated to Date: 23
Plan Prescribed Dose Per Fraction: 1.8 Gy
Plan Prescribed Dose Per Fraction: 1.8 Gy
Plan Total Fractions Prescribed: 14
Plan Total Fractions Prescribed: 28
Plan Total Prescribed Dose: 25.2 Gy
Plan Total Prescribed Dose: 50.4 Gy
Reference Point Dosage Given to Date: 21.6 Gy
Reference Point Dosage Given to Date: 41.4 Gy
Reference Point Session Dosage Given: 1.8 Gy
Reference Point Session Dosage Given: 1.8 Gy
Session Number: 23

## 2023-07-13 ENCOUNTER — Ambulatory Visit
Admission: RE | Admit: 2023-07-13 | Discharge: 2023-07-13 | Disposition: A | Discharge status: Home / Self Care | Payer: BC Managed Care – PPO | Source: Ambulatory Visit | Attending: Radiation Oncology | Admitting: Radiation Oncology

## 2023-07-13 ENCOUNTER — Ambulatory Visit: Payer: BC Managed Care – PPO

## 2023-07-13 ENCOUNTER — Other Ambulatory Visit: Payer: Self-pay

## 2023-07-13 DIAGNOSIS — R079 Chest pain, unspecified: Secondary | ICD-10-CM | POA: Diagnosis not present

## 2023-07-13 LAB — RAD ONC ARIA SESSION SUMMARY
Course Elapsed Days: 42
Plan Fractions Treated to Date: 12
Plan Fractions Treated to Date: 24
Plan Prescribed Dose Per Fraction: 1.8 Gy
Plan Prescribed Dose Per Fraction: 1.8 Gy
Plan Total Fractions Prescribed: 14
Plan Total Fractions Prescribed: 28
Plan Total Prescribed Dose: 25.2 Gy
Plan Total Prescribed Dose: 50.4 Gy
Reference Point Dosage Given to Date: 21.6 Gy
Reference Point Dosage Given to Date: 43.2 Gy
Reference Point Session Dosage Given: 1.8 Gy
Reference Point Session Dosage Given: 1.8 Gy
Session Number: 24

## 2023-07-14 ENCOUNTER — Ambulatory Visit: Payer: BC Managed Care – PPO

## 2023-07-14 ENCOUNTER — Ambulatory Visit
Admission: RE | Admit: 2023-07-14 | Discharge: 2023-07-14 | Disposition: A | Discharge status: Home / Self Care | Payer: BC Managed Care – PPO | Source: Ambulatory Visit | Attending: Radiation Oncology | Admitting: Radiation Oncology

## 2023-07-14 ENCOUNTER — Other Ambulatory Visit: Payer: Self-pay

## 2023-07-14 DIAGNOSIS — R079 Chest pain, unspecified: Secondary | ICD-10-CM | POA: Diagnosis not present

## 2023-07-14 LAB — RAD ONC ARIA SESSION SUMMARY
Course Elapsed Days: 43
Plan Fractions Treated to Date: 13
Plan Fractions Treated to Date: 25
Plan Prescribed Dose Per Fraction: 1.8 Gy
Plan Prescribed Dose Per Fraction: 1.8 Gy
Plan Total Fractions Prescribed: 14
Plan Total Fractions Prescribed: 28
Plan Total Prescribed Dose: 25.2 Gy
Plan Total Prescribed Dose: 50.4 Gy
Reference Point Dosage Given to Date: 23.4 Gy
Reference Point Dosage Given to Date: 45 Gy
Reference Point Session Dosage Given: 1.8 Gy
Reference Point Session Dosage Given: 1.8 Gy
Session Number: 25

## 2023-07-15 ENCOUNTER — Ambulatory Visit: Payer: BC Managed Care – PPO

## 2023-07-15 ENCOUNTER — Other Ambulatory Visit: Payer: Self-pay

## 2023-07-15 ENCOUNTER — Ambulatory Visit
Admission: RE | Admit: 2023-07-15 | Discharge: 2023-07-15 | Disposition: A | Discharge status: Home / Self Care | Payer: BC Managed Care – PPO | Source: Ambulatory Visit | Attending: Radiation Oncology | Admitting: Radiation Oncology

## 2023-07-15 DIAGNOSIS — R079 Chest pain, unspecified: Secondary | ICD-10-CM | POA: Diagnosis not present

## 2023-07-15 LAB — RAD ONC ARIA SESSION SUMMARY
Course Elapsed Days: 44
Plan Fractions Treated to Date: 13
Plan Fractions Treated to Date: 26
Plan Prescribed Dose Per Fraction: 1.8 Gy
Plan Prescribed Dose Per Fraction: 1.8 Gy
Plan Total Fractions Prescribed: 14
Plan Total Fractions Prescribed: 28
Plan Total Prescribed Dose: 25.2 Gy
Plan Total Prescribed Dose: 50.4 Gy
Reference Point Dosage Given to Date: 23.4 Gy
Reference Point Dosage Given to Date: 46.8 Gy
Reference Point Session Dosage Given: 1.8 Gy
Reference Point Session Dosage Given: 1.8 Gy
Session Number: 26

## 2023-07-16 ENCOUNTER — Ambulatory Visit: Payer: BC Managed Care – PPO

## 2023-07-16 ENCOUNTER — Ambulatory Visit
Admission: RE | Admit: 2023-07-16 | Discharge: 2023-07-16 | Disposition: A | Discharge status: Home / Self Care | Payer: BC Managed Care – PPO | Source: Ambulatory Visit | Attending: Radiation Oncology | Admitting: Radiation Oncology

## 2023-07-16 ENCOUNTER — Other Ambulatory Visit: Payer: Self-pay

## 2023-07-16 DIAGNOSIS — R079 Chest pain, unspecified: Secondary | ICD-10-CM | POA: Diagnosis not present

## 2023-07-16 LAB — RAD ONC ARIA SESSION SUMMARY
Course Elapsed Days: 45
Plan Fractions Treated to Date: 14
Plan Fractions Treated to Date: 27
Plan Prescribed Dose Per Fraction: 1.8 Gy
Plan Prescribed Dose Per Fraction: 1.8 Gy
Plan Total Fractions Prescribed: 14
Plan Total Fractions Prescribed: 28
Plan Total Prescribed Dose: 25.2 Gy
Plan Total Prescribed Dose: 50.4 Gy
Reference Point Dosage Given to Date: 25.2 Gy
Reference Point Dosage Given to Date: 48.6 Gy
Reference Point Session Dosage Given: 1.8 Gy
Reference Point Session Dosage Given: 1.8 Gy
Session Number: 27

## 2023-07-19 ENCOUNTER — Ambulatory Visit
Admission: RE | Admit: 2023-07-19 | Discharge: 2023-07-19 | Disposition: A | Discharge status: Home / Self Care | Payer: BC Managed Care – PPO | Source: Ambulatory Visit | Attending: Radiation Oncology | Admitting: Radiation Oncology

## 2023-07-19 ENCOUNTER — Ambulatory Visit: Payer: BC Managed Care – PPO

## 2023-07-19 ENCOUNTER — Other Ambulatory Visit: Payer: Self-pay

## 2023-07-19 DIAGNOSIS — C773 Secondary and unspecified malignant neoplasm of axilla and upper limb lymph nodes: Secondary | ICD-10-CM | POA: Diagnosis not present

## 2023-07-19 DIAGNOSIS — Z170 Estrogen receptor positive status [ER+]: Secondary | ICD-10-CM | POA: Insufficient documentation

## 2023-07-19 DIAGNOSIS — Z1721 Progesterone receptor positive status: Secondary | ICD-10-CM | POA: Insufficient documentation

## 2023-07-19 DIAGNOSIS — Z1732 Human epidermal growth factor receptor 2 negative status: Secondary | ICD-10-CM | POA: Insufficient documentation

## 2023-07-19 DIAGNOSIS — Z510 Encounter for antineoplastic radiation therapy: Secondary | ICD-10-CM | POA: Insufficient documentation

## 2023-07-19 DIAGNOSIS — C50412 Malignant neoplasm of upper-outer quadrant of left female breast: Secondary | ICD-10-CM | POA: Insufficient documentation

## 2023-07-19 LAB — RAD ONC ARIA SESSION SUMMARY
Course Elapsed Days: 48
Plan Fractions Treated to Date: 14
Plan Fractions Treated to Date: 28
Plan Prescribed Dose Per Fraction: 1.8 Gy
Plan Prescribed Dose Per Fraction: 1.8 Gy
Plan Total Fractions Prescribed: 14
Plan Total Fractions Prescribed: 28
Plan Total Prescribed Dose: 25.2 Gy
Plan Total Prescribed Dose: 50.4 Gy
Reference Point Dosage Given to Date: 25.2 Gy
Reference Point Dosage Given to Date: 50.4 Gy
Reference Point Session Dosage Given: 1.8 Gy
Reference Point Session Dosage Given: 1.8 Gy
Session Number: 28

## 2023-07-20 ENCOUNTER — Ambulatory Visit: Payer: BC Managed Care – PPO

## 2023-07-20 NOTE — Radiation Completion Notes (Signed)
Patient Name: [name], [name] MRN: [id number] Date of Birth: April 01, 1986 Referring Physician: Obie Dredge, M.D. Date of Service: 2023-07-20 Radiation Oncologist: Lonie Peak, M.D. [facility] -                              RADIATION ONCOLOGY END OF TREATMENT NOTE     Diagnosis: C50.412 Malignant neoplasm of upper-outer quadrant of left female breast Staging on 2023-05-04: Malignant neoplasm of upper outer quadrant of female breast (HCC) T=pT1b, N=pN1a, M=cM0 Intent: Curative     ==========DELIVERED PLANS==========  First Treatment Date: 2023-06-01 Last Treatment Date: 2023-07-19   Plan Name: CW_L_BO_BH Site: Chest Wall, Left Technique: 3D Mode: Photon Dose Per Fraction: 1.8 Gy Prescribed Dose (Delivered / Prescribed): 25.2 Gy / 25.2 Gy Prescribed Fxs (Delivered / Prescribed): 14 / 14   Plan Name: CW_L_SCV_BH Site: Chest Wall, Left Technique: 3D Mode: Photon Dose Per Fraction: 1.8 Gy Prescribed Dose (Delivered / Prescribed): 50.4 Gy / 50.4 Gy Prescribed Fxs (Delivered / Prescribed): 28 / 28   Plan Name: CW_L_BH Site: Chest Wall, Left Technique: 3D Mode: Photon Dose Per Fraction: 1.8 Gy Prescribed Dose (Delivered / Prescribed): 25.2 Gy / 25.2 Gy Prescribed Fxs (Delivered / Prescribed): 14 / 14     ==========ON TREATMENT VISIT DATES========== 2023-06-07, 2023-06-11, 2023-06-21, 2023-06-28, 2023-07-02, 2023-07-12, 2023-07-19     ==========UPCOMING VISITS==========       ==========APPENDIX - ON TREATMENT VISIT NOTES==========   See weekly On Treatment Notes in Epic for details in the Media tab (listed as Progress notes on the On Treatment Visit Dates listed above).

## 2023-08-05 NOTE — Progress Notes (Incomplete)
  [name] presents today for a one month telephone follow-up after completing radiation to her left breast on 07/19/2023.  Pain: Skin:  ROM:  Lymphedema:  MedOnc F/U:  Other issues of note:   Pt reports Yes No Comments  Tamoxifen []  []    Letrozole []  []    Anastrazole []  []    Mammogram []  Date:  []

## 2023-08-17 ENCOUNTER — Inpatient Hospital Stay (HOSPITAL_COMMUNITY)
Admission: AD | Admit: 2023-08-17 | Discharge: 2023-08-21 | DRG: 885 | Disposition: A | Discharge status: Home / Self Care | Source: Intra-hospital | Attending: Psychiatry | Admitting: Psychiatry

## 2023-08-17 ENCOUNTER — Ambulatory Visit (HOSPITAL_COMMUNITY)
Admission: EM | Admit: 2023-08-17 | Discharge: 2023-08-17 | Disposition: A | Discharge status: Other Acute Inpatient Hospital | Attending: Psychiatry | Admitting: Psychiatry

## 2023-08-17 DIAGNOSIS — F419 Anxiety disorder, unspecified: Secondary | ICD-10-CM | POA: Diagnosis not present

## 2023-08-17 DIAGNOSIS — Z79899 Other long term (current) drug therapy: Secondary | ICD-10-CM

## 2023-08-17 DIAGNOSIS — Z9013 Acquired absence of bilateral breasts and nipples: Secondary | ICD-10-CM | POA: Diagnosis not present

## 2023-08-17 DIAGNOSIS — F332 Major depressive disorder, recurrent severe without psychotic features: Secondary | ICD-10-CM | POA: Diagnosis present

## 2023-08-17 DIAGNOSIS — Z560 Unemployment, unspecified: Secondary | ICD-10-CM | POA: Diagnosis not present

## 2023-08-17 DIAGNOSIS — F4310 Post-traumatic stress disorder, unspecified: Secondary | ICD-10-CM | POA: Insufficient documentation

## 2023-08-17 DIAGNOSIS — Z9851 Tubal ligation status: Secondary | ICD-10-CM

## 2023-08-17 DIAGNOSIS — F17290 Nicotine dependence, other tobacco product, uncomplicated: Secondary | ICD-10-CM | POA: Diagnosis present

## 2023-08-17 DIAGNOSIS — Z5986 Financial insecurity: Secondary | ICD-10-CM | POA: Diagnosis not present

## 2023-08-17 DIAGNOSIS — R45851 Suicidal ideations: Secondary | ICD-10-CM | POA: Diagnosis present

## 2023-08-17 DIAGNOSIS — Z79811 Long term (current) use of aromatase inhibitors: Secondary | ICD-10-CM | POA: Diagnosis not present

## 2023-08-17 DIAGNOSIS — Z630 Problems in relationship with spouse or partner: Secondary | ICD-10-CM

## 2023-08-17 DIAGNOSIS — F17210 Nicotine dependence, cigarettes, uncomplicated: Secondary | ICD-10-CM | POA: Diagnosis present

## 2023-08-17 DIAGNOSIS — E785 Hyperlipidemia, unspecified: Secondary | ICD-10-CM | POA: Diagnosis present

## 2023-08-17 DIAGNOSIS — Z853 Personal history of malignant neoplasm of breast: Secondary | ICD-10-CM | POA: Insufficient documentation

## 2023-08-17 DIAGNOSIS — I10 Essential (primary) hypertension: Secondary | ICD-10-CM | POA: Diagnosis present

## 2023-08-17 DIAGNOSIS — Z638 Other specified problems related to primary support group: Secondary | ICD-10-CM | POA: Diagnosis not present

## 2023-08-17 DIAGNOSIS — Z8249 Family history of ischemic heart disease and other diseases of the circulatory system: Secondary | ICD-10-CM | POA: Diagnosis not present

## 2023-08-17 DIAGNOSIS — Z87442 Personal history of urinary calculi: Secondary | ICD-10-CM | POA: Diagnosis not present

## 2023-08-17 DIAGNOSIS — Z923 Personal history of irradiation: Secondary | ICD-10-CM | POA: Diagnosis not present

## 2023-08-17 DIAGNOSIS — Z833 Family history of diabetes mellitus: Secondary | ICD-10-CM

## 2023-08-17 DIAGNOSIS — Z888 Allergy status to other drugs, medicaments and biological substances status: Secondary | ICD-10-CM | POA: Diagnosis not present

## 2023-08-17 DIAGNOSIS — Z818 Family history of other mental and behavioral disorders: Secondary | ICD-10-CM | POA: Diagnosis not present

## 2023-08-17 DIAGNOSIS — F339 Major depressive disorder, recurrent, unspecified: Secondary | ICD-10-CM | POA: Diagnosis present

## 2023-08-17 DIAGNOSIS — K219 Gastro-esophageal reflux disease without esophagitis: Secondary | ICD-10-CM | POA: Diagnosis present

## 2023-08-17 LAB — CBC WITH DIFFERENTIAL/PLATELET
Abs Immature Granulocytes: 0.02 10*3/uL (ref 0.00–0.07)
Basophils Absolute: 0.1 10*3/uL (ref 0.0–0.1)
Basophils Relative: 1 %
Eosinophils Absolute: 0.2 10*3/uL (ref 0.0–0.5)
Eosinophils Relative: 3 %
HCT: 39.7 % (ref 36.0–46.0)
Hemoglobin: 12.5 g/dL (ref 12.0–15.0)
Immature Granulocytes: 0 %
Lymphocytes Relative: 15 %
Lymphs Abs: 0.9 10*3/uL (ref 0.7–4.0)
MCH: 23.4 pg — ABNORMAL LOW (ref 26.0–34.0)
MCHC: 31.5 g/dL (ref 30.0–36.0)
MCV: 74.3 fL — ABNORMAL LOW (ref 80.0–100.0)
Monocytes Absolute: 0.5 10*3/uL (ref 0.1–1.0)
Monocytes Relative: 7 %
Neutro Abs: 4.7 10*3/uL (ref 1.7–7.7)
Neutrophils Relative %: 74 %
Platelets: 246 10*3/uL (ref 150–400)
RBC: 5.34 MIL/uL — ABNORMAL HIGH (ref 3.87–5.11)
RDW: 15.9 % — ABNORMAL HIGH (ref 11.5–15.5)
WBC: 6.4 10*3/uL (ref 4.0–10.5)
nRBC: 0 % (ref 0.0–0.2)

## 2023-08-17 LAB — LIPID PANEL
Cholesterol: 189 mg/dL (ref 0–200)
HDL: 58 mg/dL (ref >40)
LDL Cholesterol: 105 mg/dL — ABNORMAL HIGH (ref 0–99)
Total CHOL/HDL Ratio: 3.3 ratio
Triglycerides: 130 mg/dL (ref <150)
VLDL: 26 mg/dL (ref 0–40)

## 2023-08-17 LAB — COMPREHENSIVE METABOLIC PANEL
ALT: 16 U/L (ref 0–44)
AST: 24 U/L (ref 15–41)
Albumin: 4 g/dL (ref 3.5–5.0)
Alkaline Phosphatase: 115 U/L (ref 38–126)
Anion gap: 8 (ref 5–15)
BUN: 13 mg/dL (ref 6–20)
CO2: 25 mmol/L (ref 22–32)
Calcium: 9.7 mg/dL (ref 8.9–10.3)
Chloride: 107 mmol/L (ref 98–111)
Creatinine, Ser: 0.88 mg/dL (ref 0.44–1.00)
GFR, Estimated: >60 mL/min (ref >60)
Glucose, Bld: 91 mg/dL (ref 70–99)
Potassium: 3.9 mmol/L (ref 3.5–5.1)
Sodium: 140 mmol/L (ref 135–145)
Total Bilirubin: 0.4 mg/dL (ref 0.0–1.2)
Total Protein: 6.6 g/dL (ref 6.5–8.1)

## 2023-08-17 LAB — HEMOGLOBIN A1C
Hgb A1c MFr Bld: 5.7 % — ABNORMAL HIGH (ref 4.8–5.6)
Mean Plasma Glucose: 116.89 mg/dL

## 2023-08-17 LAB — TSH: TSH: 1.77 u[IU]/mL (ref 0.350–4.500)

## 2023-08-17 LAB — POCT URINE DRUG SCREEN - MANUAL ENTRY (I-SCREEN)
POC Amphetamine UR: NOT DETECTED
POC Buprenorphine (BUP): NOT DETECTED
POC Cocaine UR: NOT DETECTED
POC Marijuana UR: POSITIVE — AB
POC Methadone UR: NOT DETECTED
POC Methamphetamine UR: NOT DETECTED
POC Morphine: NOT DETECTED
POC Oxazepam (BZO): POSITIVE — AB
POC Oxycodone UR: NOT DETECTED
POC Secobarbital (BAR): NOT DETECTED

## 2023-08-17 LAB — POCT PREGNANCY, URINE: Preg Test, Ur: NEGATIVE

## 2023-08-17 MED ORDER — LETROZOLE 2.5 MG PO TABS
2.5000 mg | ORAL_TABLET | Freq: Every day | ORAL | Status: DC
  Administered 2023-08-18 – 2023-08-21 (×4): 2.5 mg via ORAL
  Filled 2023-08-17 (×5): qty 1

## 2023-08-17 MED ORDER — ATORVASTATIN CALCIUM 40 MG PO TABS
40.0000 mg | ORAL_TABLET | Freq: Every day | ORAL | Status: DC
  Administered 2023-08-17: 40 mg via ORAL
  Filled 2023-08-17: qty 1

## 2023-08-17 MED ORDER — LORAZEPAM 2 MG/ML IJ SOLN: 2.0000 mg | Freq: Three times a day (TID) | INTRAMUSCULAR | Status: DC | PRN

## 2023-08-17 MED ORDER — HALOPERIDOL LACTATE 5 MG/ML IJ SOLN: 10.0000 mg | Freq: Three times a day (TID) | INTRAMUSCULAR | Status: DC | PRN

## 2023-08-17 MED ORDER — AMLODIPINE BESYLATE 10 MG PO TABS
10.0000 mg | ORAL_TABLET | Freq: Every day | ORAL | Status: DC
  Administered 2023-08-18: 10 mg via ORAL
  Filled 2023-08-17: qty 1
  Filled 2023-08-17: qty 2
  Filled 2023-08-17: qty 1

## 2023-08-17 MED ORDER — MIRTAZAPINE 30 MG PO TABS
30.0000 mg | ORAL_TABLET | Freq: Every day | ORAL | Status: DC
  Filled 2023-08-17 (×3): qty 1

## 2023-08-17 MED ORDER — MIRTAZAPINE 30 MG PO TABS
30.0000 mg | ORAL_TABLET | Freq: Every day | ORAL | Status: DC
Start: 2023-08-17 — End: 2023-08-17
  Administered 2023-08-17: 30 mg via ORAL
  Filled 2023-08-17: qty 1

## 2023-08-17 MED ORDER — NORTRIPTYLINE HCL 25 MG PO CAPS
100.0000 mg | ORAL_CAPSULE | Freq: Every day | ORAL | Status: DC
  Filled 2023-08-17: qty 4

## 2023-08-17 MED ORDER — PROPRANOLOL HCL ER 60 MG PO CP24
120.0000 mg | ORAL_CAPSULE | Freq: Every day | ORAL | Status: DC
  Administered 2023-08-17: 120 mg via ORAL
  Filled 2023-08-17: qty 2

## 2023-08-17 MED ORDER — HALOPERIDOL LACTATE 5 MG/ML IJ SOLN: 5.0000 mg | Freq: Three times a day (TID) | INTRAMUSCULAR | Status: DC | PRN

## 2023-08-17 MED ORDER — MELATONIN 3 MG PO TABS
3.0000 mg | ORAL_TABLET | Freq: Every evening | ORAL | Status: DC | PRN
Start: 2023-08-17 — End: 2023-08-17

## 2023-08-17 MED ORDER — DIPHENHYDRAMINE HCL 50 MG/ML IJ SOLN: 50.0000 mg | Freq: Three times a day (TID) | INTRAMUSCULAR | Status: DC | PRN

## 2023-08-17 MED ORDER — ATORVASTATIN CALCIUM 40 MG PO TABS: 40.0000 mg | ORAL_TABLET | Freq: Every day | ORAL | Status: DC

## 2023-08-17 MED ORDER — AMLODIPINE BESYLATE 10 MG PO TABS
10.0000 mg | ORAL_TABLET | Freq: Every day | ORAL | Status: DC
  Administered 2023-08-17: 10 mg via ORAL
  Filled 2023-08-17: qty 1

## 2023-08-17 MED ORDER — HYDROXYZINE HCL 25 MG PO TABS
25.0000 mg | ORAL_TABLET | Freq: Three times a day (TID) | ORAL | Status: DC | PRN
  Administered 2023-08-17 (×2): 25 mg via ORAL
  Filled 2023-08-17 (×2): qty 1

## 2023-08-17 MED ORDER — ATORVASTATIN CALCIUM 40 MG PO TABS
40.0000 mg | ORAL_TABLET | Freq: Every day | ORAL | Status: DC
  Administered 2023-08-18 – 2023-08-21 (×4): 40 mg via ORAL
  Filled 2023-08-17 (×5): qty 1

## 2023-08-17 MED ORDER — CLONIDINE HCL 0.1 MG PO TABS
0.1000 mg | ORAL_TABLET | Freq: Once | ORAL | Status: AC
  Administered 2023-08-17: 0.1 mg via ORAL
  Filled 2023-08-17: qty 1

## 2023-08-17 MED ORDER — DIPHENHYDRAMINE HCL 50 MG PO CAPS: 50.0000 mg | ORAL_CAPSULE | Freq: Three times a day (TID) | ORAL | Status: DC | PRN

## 2023-08-17 MED ORDER — ALUM & MAG HYDROXIDE-SIMETH 200-200-20 MG/5ML PO SUSP: 30.0000 mL | ORAL | Status: DC | PRN

## 2023-08-17 MED ORDER — HALOPERIDOL 5 MG PO TABS: 5.0000 mg | ORAL_TABLET | Freq: Three times a day (TID) | ORAL | Status: DC | PRN

## 2023-08-17 MED ORDER — MELATONIN 3 MG PO TABS
3.0000 mg | ORAL_TABLET | Freq: Every evening | ORAL | Status: DC | PRN
  Administered 2023-08-17 – 2023-08-18 (×2): 3 mg via ORAL
  Filled 2023-08-17 (×2): qty 1

## 2023-08-17 MED ORDER — MAGNESIUM HYDROXIDE 400 MG/5ML PO SUSP: 30.0000 mL | Freq: Every day | ORAL | Status: DC | PRN

## 2023-08-17 MED ORDER — ACETAMINOPHEN 325 MG PO TABS: 650.0000 mg | ORAL_TABLET | Freq: Four times a day (QID) | ORAL | Status: DC | PRN

## 2023-08-17 MED ORDER — ACETAMINOPHEN 325 MG PO TABS
650.0000 mg | ORAL_TABLET | Freq: Four times a day (QID) | ORAL | Status: DC | PRN
  Administered 2023-08-18 – 2023-08-20 (×4): 650 mg via ORAL
  Filled 2023-08-17 (×4): qty 2

## 2023-08-17 MED ORDER — TRAZODONE HCL 50 MG PO TABS: 50.0000 mg | ORAL_TABLET | Freq: Every evening | ORAL | Status: DC | PRN

## 2023-08-17 MED ORDER — HYDROXYZINE HCL 25 MG PO TABS
25.0000 mg | ORAL_TABLET | Freq: Three times a day (TID) | ORAL | Status: DC | PRN
  Administered 2023-08-17 – 2023-08-20 (×4): 25 mg via ORAL
  Filled 2023-08-17 (×4): qty 1

## 2023-08-17 MED ORDER — DIPHENHYDRAMINE HCL 25 MG PO CAPS: 50.0000 mg | ORAL_CAPSULE | Freq: Three times a day (TID) | ORAL | Status: DC | PRN

## 2023-08-17 MED ORDER — LETROZOLE 2.5 MG PO TABS
2.5000 mg | ORAL_TABLET | Freq: Every day | ORAL | Status: DC
  Filled 2023-08-17 (×2): qty 1

## 2023-08-17 MED ORDER — PROPRANOLOL HCL ER 60 MG PO CP24
120.0000 mg | ORAL_CAPSULE | Freq: Every day | ORAL | Status: DC
  Administered 2023-08-19 – 2023-08-21 (×3): 120 mg via ORAL
  Filled 2023-08-17 (×4): qty 1
  Filled 2023-08-17: qty 2
  Filled 2023-08-17: qty 1

## 2023-08-17 MED ORDER — NORTRIPTYLINE HCL 25 MG PO CAPS: 100.0000 mg | ORAL_CAPSULE | Freq: Every day | ORAL | Status: DC

## 2023-08-17 MED ORDER — PANTOPRAZOLE SODIUM 40 MG PO TBEC
40.0000 mg | DELAYED_RELEASE_TABLET | Freq: Every day | ORAL | Status: DC
  Administered 2023-08-17: 40 mg via ORAL
  Filled 2023-08-17: qty 1

## 2023-08-17 MED ORDER — PANTOPRAZOLE SODIUM 40 MG PO TBEC
40.0000 mg | DELAYED_RELEASE_TABLET | Freq: Every day | ORAL | Status: DC
  Administered 2023-08-18 – 2023-08-21 (×4): 40 mg via ORAL
  Filled 2023-08-17 (×5): qty 1

## 2023-08-17 MED ORDER — NORTRIPTYLINE HCL 25 MG PO CAPS
100.0000 mg | ORAL_CAPSULE | Freq: Every day | ORAL | Status: DC
  Filled 2023-08-17 (×4): qty 4

## 2023-08-17 NOTE — ED Notes (Signed)
 Report called to [facility], RN [employer].  Pending Safe Transport to [facility], rm [place]

## 2023-08-17 NOTE — Progress Notes (Signed)
 Pt has been accepted to [facility] tonight on 08/17/2023 Bed assignment: 304-2  Pt meets inpatient criteria per: Olin Pia NP  Attending Physician will be: Dr. Sarita Bottom, MD   Report can be called to: Adult unit: [phone]  Pt can arrive pending staff and DC.   Care Team Notified: [facility] Rona Ravens RN, Olin Pia NP, Bedelia Person RN  [place] Zoeie Ritter LCSW-A   08/17/2023 12:56 PM

## 2023-08-17 NOTE — ED Notes (Signed)
 Patient observed/assessed at bedside. Patient alert and oriented to self and location. Affect is flat. Patient denies pain and anxiety. He denies A/V/H. He denies having any thoughts/plan of self harm and harm towards others. Fluid and snack offered. Patient states that appetite has been good throughout the day.  Verbalizes no further complaints at this time. Will continue to monitor and support.
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
## 2023-08-17 NOTE — Progress Notes (Signed)
 I spoke with [facility].   Pt is currently on Amlodipine 10mg  PO daily Propranolol ER 120mg  PO daily Valsartan 160mg  PO daily  Atorvastatin 40mg  PO daily   Mirtazapine 30mg  PO daily  Altrazol 2.5 mg po daily Protonix 40mg  PO daily Gabapentin 600mg  PO HS Nortriptyline 100mg  PO Daily  Provider made aware

## 2023-08-17 NOTE — ED Notes (Signed)
 Pt sitting up watching TV.  She was offered juice and sandwich but refused.  Awaiting transfer after 7p to [facility] for inpt hospitalization.   No further distress noted.

## 2023-08-17 NOTE — ED Notes (Signed)
 Patient has been brought on unit familiarized with unit and is now sitting in bed quielty resting. Patient appears to be in no acute distress, will continue to monitor patient for safety
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
## 2023-08-17 NOTE — ED Provider Notes (Signed)
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  -Clonidine 0.1 mg PO, once, for hypertension  -As needed agitation protocol medications   Recommendations  Based on my evaluation the patient does not appear to have an emergency medical condition.  Recommend inpatient psychiatric admission for stabilization and treatment.  Mancel Bale, NP 08/17/23  5:42 AM

## 2023-08-17 NOTE — ED Provider Notes (Signed)
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  Gy Final   [facility] Session Dosage Giv* 07/07/2023 1.8  Gy Final   Plan ID 07/07/2023 [email or web address]   Final   Plan Name 07/07/2023 [email or web address]   Final   Plan Fractions Treated to Date 07/07/2023 20   Final   Plan Total Fractions Prescribed 07/07/2023 28   Final   Plan Prescribed Dose Per Fraction 07/07/2023 1.8  Gy Final   Plan Total Prescribed Dose 07/07/2023 50.400000  Gy Final   Plan Primary Reference Point 07/07/2023 [email or web address]   Final   Plan ID 07/07/2023 [email or web address]   Final   Plan Name 07/07/2023 [email or web address]   Final   Plan Fractions Treated to Date 07/07/2023 10   Final   Plan Total Fractions Prescribed 07/07/2023 14   Final   Plan Prescribed Dose Per Fraction 07/07/2023 1.8  Gy Final   Plan Total Prescribed Dose 07/07/2023 25.200000  Gy Final   Plan Primary Reference Point 07/07/2023 [email or web address]   Final  There may be more  visits with results that are not included.    Blood Alcohol level:  No results found for: "ETH"  Metabolic Disorder Labs: Lab Results  Component Value Date   HGBA1C 5.7 (H) 08/17/2023   MPG 116.89 08/17/2023   No results found for: "PROLACTIN" Lab Results  Component Value Date   CHOL 189 08/17/2023   TRIG 130 08/17/2023   HDL 58 08/17/2023   CHOLHDL 3.3 08/17/2023   VLDL 26 08/17/2023   LDLCALC 105 (H) 08/17/2023    Therapeutic Lab Levels: No results found for: "LITHIUM" No results found for: "VALPROATE" No results found for: "CBMZ"  Physical Findings   PHQ2-9    Flowsheet Row ED from 08/17/2023 in [facility]  PHQ-2 Total Score 2  PHQ-9 Total Score 13      Flowsheet Row ED from 08/17/2023 in [facility] Pre-Admission Testing 45 from [facility]/24/2021 in [facility] PRE [facility] TEST[facility]  C-SSRS RISK CATEGORY High Risk No Risk        Musculoskeletal  Strength & Muscle Tone: within normal limits Gait & Station: normal Patient leans: N/A  Psychiatric Specialty Exam  Presentation  General Appearance:  Appropriate for Environment  Eye Contact: Fair  Speech: Clear and Coherent  Speech Volume: Normal  Handedness: Right   Mood and Affect  Mood: Anxious; Depressed; Hopeless  Affect: Depressed; Tearful   Thought Process  Thought Processes: Coherent  Descriptions of Associations:Intact  Orientation:Full (Time, Place and Person)  Thought Content:Logical; WDL  Diagnosis of Schizophrenia or Schizoaffective disorder in past: No    Hallucinations:Hallucinations: None  Ideas of Reference:None  Suicidal Thoughts:Suicidal Thoughts: Yes, Active SI Active Intent and/or Plan: With Intent; With Plan  Homicidal Thoughts:Homicidal Thoughts: No   Sensorium  Memory: Immediate Good; Recent Good; Remote Good  Judgment: Poor  Insight: Fair  Executive Functions   Concentration: Fair  Attention Span: Fair  Recall: [employer] of Knowledge: Fair  Language: Fair   Psychomotor Activity  Psychomotor Activity: Psychomotor Activity: Normal   Assets  Assets: Manufacturing systems engineer; Desire for Improvement; Financial Resources/Insurance   Sleep  Sleep: Sleep: Fair Number of Hours of Sleep: 6   Nutritional Assessment (For OBS and [facility] admissions only) Has the patient had a weight loss or gain of 10 pounds or more in the last 3 months?: No Has the patient had a decrease in food intake/or appetite?: No Does the patient have dental problems?: No Does the patient have eating habits or behaviors that may be indicators of an eating disorder including binging or inducing vomiting?: No Has the patient recently lost weight without trying?: 0 Has the patient been eating poorly because of a decreased appetite?: 0 Malnutrition Screening Tool Score: 0    Physical Exam  Physical Exam Constitutional:      Appearance: Normal appearance.  HENT:     Head: Normocephalic and atraumatic.     Right Ear: Tympanic membrane normal.     Left Ear: Tympanic membrane normal.     Mouth/Throat:     Mouth: Mucous membranes are moist.  Eyes:     Extraocular Movements: Extraocular movements intact.     Pupils: Pupils are equal, round, and reactive to light.  Cardiovascular:     Rate and Rhythm: Normal rate.     Pulses: Normal pulses.  Pulmonary:     Effort: Pulmonary effort is normal.  Musculoskeletal:        General: Normal range of motion.     Cervical back: Normal range of motion and neck supple.  Neurological:     General: No focal deficit present.     Mental Status: She is alert and oriented to person, place, and time.    Review of Systems  Constitutional: Negative.   HENT: Negative.    Eyes: Negative.   Respiratory: Negative.    Cardiovascular: Negative.   Gastrointestinal: Negative.   Genitourinary: Negative.   Musculoskeletal: Negative.    Skin: Negative.   Neurological: Negative.   Endo/Heme/Allergies: Negative.   Psychiatric/Behavioral:  Positive for depression and suicidal ideas. The patient is nervous/anxious and has insomnia.    Blood pressure (!) 134/110, pulse 90, temperature 97.6 F (36.4 C), temperature source Oral, resp. rate 19, SpO2 96%. There is no height or weight on file to calculate BMI.  Treatment Plan Summary: Daily contact with patient to assess and evaluate symptoms and progress in treatment, Medication management, and Plan transfer to [facility] tonight.   Olin Pia, NP 08/17/2023 11:49 AM

## 2023-08-17 NOTE — ED Notes (Signed)
 Pt sitting up in bed. Flat sad affect and depressed mood. Currently denies any SI, HI or AVH.  Pt asking to be discharged from [facility].

## 2023-08-17 NOTE — ED Notes (Signed)
Pt resting quietly. Breathing even and unlabored.  

## 2023-08-18 ENCOUNTER — Encounter (HOSPITAL_COMMUNITY): Payer: Self-pay

## 2023-08-18 ENCOUNTER — Encounter (HOSPITAL_COMMUNITY): Payer: Self-pay | Admitting: Psychiatry

## 2023-08-18 ENCOUNTER — Other Ambulatory Visit: Payer: Self-pay

## 2023-08-18 DIAGNOSIS — F339 Major depressive disorder, recurrent, unspecified: Secondary | ICD-10-CM | POA: Diagnosis not present

## 2023-08-18 MED ORDER — MIRTAZAPINE 30 MG PO TABS
30.0000 mg | ORAL_TABLET | Freq: Every day | ORAL | Status: DC
  Administered 2023-08-19 – 2023-08-20 (×2): 30 mg via ORAL
  Filled 2023-08-18 (×3): qty 1

## 2023-08-18 MED ORDER — ARIPIPRAZOLE 2 MG PO TABS
2.0000 mg | ORAL_TABLET | Freq: Every day | ORAL | Status: DC
  Administered 2023-08-18 – 2023-08-19 (×2): 2 mg via ORAL
  Filled 2023-08-18 (×4): qty 1

## 2023-08-18 MED ORDER — AMLODIPINE BESYLATE 10 MG PO TABS: 10.0000 mg | ORAL_TABLET | Freq: Every day | ORAL | Status: DC

## 2023-08-18 MED ORDER — AMLODIPINE BESYLATE 10 MG PO TABS
10.0000 mg | ORAL_TABLET | Freq: Every day | ORAL | Status: DC
  Administered 2023-08-19 – 2023-08-21 (×3): 10 mg via ORAL
  Filled 2023-08-18 (×4): qty 1

## 2023-08-18 MED ORDER — IRBESARTAN 150 MG PO TABS
150.0000 mg | ORAL_TABLET | Freq: Every day | ORAL | Status: DC
  Administered 2023-08-18 – 2023-08-21 (×4): 150 mg via ORAL
  Filled 2023-08-18 (×5): qty 1

## 2023-08-18 NOTE — Plan of Care (Signed)
   Problem: Education: Goal: Emotional status will improve Outcome: Not Progressing Goal: Mental status will improve Outcome: Not Progressing

## 2023-08-18 NOTE — Group Note (Signed)
 Recreation Therapy Group Note   Group Topic:Health and Wellness  Group Date: 08/18/2023 Start Time: 0935 End Time: 1010 Facilitators: Bayard More-McCall, LRT,CTRS Location: [address] Dayroom   Group Topic: Wellness  Goal Area(s) Addresses:  Patient will define components of whole wellness. Patient will verbalize benefit of whole wellness.  Intervention: Worksheets  Activity: Financial controller. LRT and patients discussed the components of wellness (mental, physical and spiritual). LRT and patients also discussed the importance of wellness and how it affects [place] on a daily basis. LRT then gave patients two worksheets of brain teasers. LRT explained to patients instead of doing physical exercise, they were going to exercise their brains and solve the brain teasers presented on the worksheets. Patients were given 20 minutes to decode as many of brain teasers they could before they went over them as a group   Education: Wellness, Building control surveyor.   Education Outcome: Acknowledges education/In group clarification offered/Needs additional education.   Affect/Mood: N/A   Participation Level: Did not attend    Clinical Observations/Individualized Feedback:      Plan: Continue to engage patient in RT group sessions 2-3x/week.   Ngozi Alvidrez-McCall, LRT,CTRS 08/18/2023 12:46 PM

## 2023-08-18 NOTE — H&P (Signed)
 Psychiatric Admission Assessment Adult  Patient Identification: [name] MRN:  [id number] Date of Evaluation:  08/18/2023 Chief Complaint:  MDD (major depressive disorder), recurrent episode (HCC) [F33.9] Principal Diagnosis: MDD (major depressive disorder), recurrent episode (HCC) Diagnosis:  Principal Problem:   MDD (major depressive disorder), recurrent episode (HCC)  CC: " Suicidal ideation with plans to overdose or crash her car by herself or run into other people."  History of Present Illness: [name] is a 38 year-old AA female with prior psychiatric diagnoses significant for MDD recurrent severe, adjustment disorder with anxiety and depression, and suicidal ideation.  Patient presents voluntarily to [facility] from [facility] for worsening depression with suicidal ideation with plans to crash her car by herself or run into other people, or overdose with medications in the context of altercation with her husband.  After medical evaluation/stabilization & clearance, she was transferred to the [facility] for further psychiatric evaluation & treatments.   During this evaluation, patient reports she had a lot of stressors in her life, and wanted her life to be over yesterday.  Reports her plans to drive and crash her car by herself or run into other people or overdose with some pills.  She identifies her stressors as, " "I had cancer last year, a double mastectomy, radiation, finished in February 2025, my body is still not right, I can't do much without getting short of breath and tired. My spouse is not supportive." Patient denies a history of suicide attempt but endorses ongoing suicidal thoughts which started during her chemotherapy treatment last year. Patient identifies her current trigger as "I seen some stuff on my husband's phone that shouldn't be there, I don't want to talk about it right now, but I just feel like hitting him, punching him in the face a few times".  Patient reports she is established with outpatient psychiatric services for medication management with [name], and prescribed nortriptyline and mirtazapine, and also sees her therapist  [name] weekly at [facility] counseling. Patient reports she lives with her spouse and 5 kids, with the first child currently in college and visiting at intervals.  Patient endorses the presence of firearms in the home but it is secured and she does not have access to it.  Patient endorses poor sleep and appetite since this incident took place.  Patient endorses THC gummies use "a couple of times a week" and vaping nicotine daily.  She denies other illicit substance use.   Objective: Patient presents alert, calm, cooperative, and oriented to person, place, time, and situation.  Chart reviewed and findings shared with the treatment team and consult with attending psychiatrist.  Mood is depressed with labile affect.  Speech is clear, coherent however, very talkative.  Able to maintain fair eye contact with this provider.  Thought process coherent, logical, and organized.  Thought content with some insight and judgment currently.  No delusional thinking or paranoia.  Patient denies SI, HI, or AVH.  Able to contract for safety while in the hospital.  Vital signs reviewed without critical values.  Admission labs reviewed as indicated under the treatment plan.  Patient is admitted fully for medication management, safety, and stabilization.  Mode of transport to Hospital: Safe transport Current Outpatient (Home) Medication List: See home medication listing PRN medication prior to evaluation: See home medication listing  ED course: Patient was assessed at [facility] behavioral health urgent care.  Labs and EKG we have obtained and analyze and patient disposition  to [facility] Collateral Information: None obtained at this time POA/Legal Guardian: Patient is own legal guardian  -Past Psychiatric Hx: Previous Psych  Diagnoses: Major depressive disorder recurrent, adjustment disorder with anxiety and depression Prior inpatient treatment: Denies Current/prior outpatient treatment: Patient is followed by [facility] Prior rehab hx: Denies Psychotherapy hx: Yes History of suicide: Denies History of homicide or aggression: Denies, apart from current incidents Psychiatric medication history: Psychiatric medication compliance history: Yes Neuromodulation history: Denies neuromodulation history Current Psychiatrist: He is currently seeing Amber. 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 Home Rx: Yes, Femara Prior Hosp: Yes for mastectomy Prior Surgeries/Trauma: Head trauma, LOC, concussions, seizures: Denies history of seizures Allergies: Hydrochlorothiazide  Drug Ingredient Other (See Comments) Medium  01/31/2015 Past Updates  Patient states that it was years ago when she had a reaction to HCTZ and she can not remember what it was  Adverse Reactions/Drug Intolerances    Labetalol  Drug Ingredient Other (See Comments) Not Specified Intolerance 01/06/2013 Past Updates  Alopecia   Lisinopril  Drug Ingredient Cough Not Specified Intolerance 04/28/2013 Past Updates  LMP: Not applicable, patient has ovaries removed Contraception: No contraception PCP: Yes  Family History: Medical: Patient unsure Psych: History of depression deceased mom Psych Rx: Patient unsure SA/HA: Patient unsure Substance use family hx: Deceased biological mom and biological dad were alcoholics  Social History: Childhood  (bring, raised, lives now, parents, siblings, schooling, education): Traumatic childhood.  Patient has associate degree college education. Abuse: Denies history of abuse      Marital Status: Married with 5 children Sexual orientation: Female from birth Children: 5 children Employment: Currently unemployed, quit her job last week Peer Group: Denies peer group Housing: Lives with her husband Finances: Some Forensic psychologist: No Special educational needs teacher: Denies serving in the [employer]  Associated Signs/Symptoms: Depression Symptoms:  depressed mood, anhedonia, fatigue, difficulty concentrating, suicidal thoughts with specific plan, anxiety, disturbed sleep, (Hypo) Manic Symptoms:  Irritable Mood, Anxiety Symptoms:  Excessive Worry, Psychotic Symptoms:   Not applicable PTSD Symptoms: Traumatic childhood Total Time spent with patient: 1.5 hours  Is the patient at risk to self? Yes.    Has the patient been a risk to self in the past 6 months? No.  Has the patient been a risk to self within the distant past? No.  Is the patient a risk to others? No.  Has the patient been a risk to others in the past 6 months? No.  Has the patient been a risk to others within the distant past? No.   [place] Scale:  Flowsheet Row [facility] Most recent reading at 08/17/2023 11:00 PM ED from 08/17/2023 in [facility] Most recent reading at 08/17/2023 10:57 AM Pre-Admission Testing 45 from 04/24/2021 in [facility] PRE ADMISSION TESTING Most recent reading at 04/24/2021 11:22 AM  C-SSRS RISK CATEGORY High Risk High Risk No Risk        Alcohol Screening: 1. How often do you have a drink containing alcohol?: Never 2. How many drinks containing alcohol do you have on a typical day when you are  drinking?: 1 or 2 3. How often do you have six or more drinks on one occasion?:  Never AUDIT-C Score: 0 4. How often during the last year have you found that you were not able to stop drinking once you had started?: Never 5. How often during the last year have you failed to do what was normally expected from you because of drinking?: Never 6. How often during the last year have you needed a first drink in the morning to get yourself going after a heavy drinking session?: Never 7. How often during the last year have you had a feeling of guilt of remorse after drinking?: Never 8. How often during the last year have you been unable to remember what happened the night before because you had been drinking?: Never 9. Have you or someone else been injured as a result of your drinking?: No 10. Has a relative or friend or a doctor or another health worker been concerned about your drinking or suggested you cut down?: No Alcohol Use Disorder Identification Test Final Score (AUDIT): 0 Alcohol Brief Interventions/Follow-up: Patient Refused  Substance Abuse History in the last 12 months:  Yes.   Consequences of Substance Abuse: Discussed with patient during this admission evaluation. Medical Consequences:  Liver damage, Possible death by overdose Legal Consequences:  Arrests, jail time, Loss of driving privilege. Family Consequences:  Family discord, divorce and or separation.  Previous Psychotropic Medications: Yes  Psychological Evaluations: Yes  Past Medical History:  Past Medical History:  Diagnosis Date   Abnormal Pap smear 08/14/2003   colpo   Anemia    History -FeSO4 SUPP IN PAST   Bronchospasm    Carpal tunnel syndrome of right wrist    Diastasis recti 12/12/2008   GBS carrier    First pregnancy   GERD    zantac   H/O chest pain 06/17/11, 06/2013   Resolved 06/2013   H/O varicella    Headache(784.0)    migraines;D/T BP   Herpes    cold sores   History of CT scan of chest    a. Cardiac CTA 10/16: no CAD   History of echocardiogram    a. Echo 10/16: EF 50%, diff  HK, mild TR, mild PI   History of kidney stones    passed stone - no surgery required   History of stress test    a. ETT-Echo 10/16: normal ECGs, + LAD area ischemia >> will arrange Cardiac CTA   HTN (hypertension)    Hyperlipidemia    Hypertension    Low BMI    LV dysfunction 12/2012, 06/2013   Hx: EF 45-50%, Resolved by Jan 2015 echo- EF 50-55%   Palpitations    Hx only - no problems   Panic attack    HAS BEEN RX'D XANAX   Pelvic pain    With pregnancy   Postpartum hypertension    Preeclampsia 2010,2011   POST PARTUM;WAS PUT ON BP MEDS   Preterm contractions 06/15/2005   GIVEN BETAMETHASONE AND PROCARDIA TO STOP CTXS   Right ovarian cyst 06/15/2001   RT OOPHRECTOMY   Spotting in first trimester    With SAB   Stress    work and at home - no meds   SVD (spontaneous vaginal delivery)    x 5    Past Surgical History:  Procedure Laterality Date   breast cyst removal  06/16/2003   right   DILATION AND CURETTAGE OF UTERUS  06/16/2007   DILITATION & CURRETTAGE/HYSTROSCOPY WITH  THERMACHOICE ABLATION N/A 10/11/2013   Procedure: DILATATION & CURETTAGE/HYSTEROSCOPY WITH THERMACHOICE ABLATION;  Surgeon: Michael Litter, MD;  Location: [facility];  Service: Gynecology;  Laterality: N/A;   LAPAROSCOPIC OVARIAN CYSTECTOMY Left 04/25/2021   Procedure: LAPAROSCOPIC OVARIAN CYSTECTOMY and salpingectomy-oophorectomy;  Surgeon: Schermerhorn, Ihor Austin, MD;  Location: [facility] OR[facility];  Service: Gynecology;  Laterality: Left;   LAPAROSCOPY N/A 10/11/2013   Procedure: LAPAROSCOPY OPERATIVE WITH REMOVAL OF HYDROSALPINX ;  Surgeon: Michael Litter, MD;  Location: [facility];  Service: Gynecology;  Laterality: N/A;   ovary removed Right 2003   right - laparotomy   TUBAL LIGATION  07/03/2012   Procedure: POST PARTUM TUBAL LIGATION;  Surgeon: Purcell Nails, MD;  Location: [facility];  Service: Gynecology;  Laterality: Bilateral;  Bilateral post partum tubal ligation   WISDOM TOOTH EXTRACTION     Family History:   Family History  Problem Relation Age of Onset   Heart disease Mother        CHF   Hypertension Mother    Asthma Mother        CHILDHOOD   Diabetes Mother    Kidney disease Mother        dialysis   Depression Mother    Alcohol abuse Mother    Drug abuse Mother    Heart failure Mother    Hypertension Father    Alcohol abuse Father    Drug abuse Father    Asthma Sister    COPD Sister        chronic bronchitis   Depression Sister        ATTEMPTED SUICIDE   Arrhythmia Sister        needs ablation    Hypertension Maternal Grandmother    Diabetes Maternal Grandmother    Family Psychiatric  History: Deceased mother has history of depression and anxiety.  Both biological father and mother has history of alcoholism. Tobacco Screening:  Social History   Tobacco Use  Smoking Status Every Day   Current packs/day: 0.15   Types: Cigarettes  Smokeless Tobacco Never    BH Tobacco Counseling     Are you interested in Tobacco Cessation Medications?  No value filed. Counseled patient on smoking cessation:  No value filed. Reason Tobacco Screening Not Completed: No value filed.    Social History:  Social History   Substance and Sexual Activity  Alcohol Use Not Currently     Social History   Substance and Sexual Activity  Drug Use Yes   Types: Marijuana   Comment: Episodic use    Additional Social History:   Allergies:   Allergies  Allergen Reactions   Hydrochlorothiazide Other (See Comments)    Patient states that it was years ago when she had a reaction to HCTZ and she can not remember what it was   Labetalol Other (See Comments)    Alopecia    Lisinopril Cough   Lab Results:  Results for orders placed or performed during the hospital encounter of 08/17/23 (from the past 48 hours)  CBC with Differential/Platelet     Status: Abnormal   Collection Time: 08/17/23  4:39 AM  Result Value Ref Range   WBC 6.4 4.0 - 10.5 K/uL   RBC 5.34 (H) 3.87 - 5.11 MIL/uL    Hemoglobin 12.5 12.0 - 15.0 g/dL   HCT 42.5 95.6 - 38.7 %   MCV 74.3 (L) 80.0 - 100.0 fL   MCH 23.4 (L) 26.0 - 34.0 pg   MCHC 31.5 30.0 -  36.0 g/dL   RDW 40.9 (H) 81.1 - 91.4 %   Platelets 246 150 - 400 K/uL   nRBC 0.0 0.0 - 0.2 %   Neutrophils Relative % 74 %   Neutro Abs 4.7 1.7 - 7.7 K/uL   Lymphocytes Relative 15 %   Lymphs Abs 0.9 0.7 - 4.0 K/uL   Monocytes Relative 7 %   Monocytes Absolute 0.5 0.1 - 1.0 K/uL   Eosinophils Relative 3 %   Eosinophils Absolute 0.2 0.0 - 0.5 K/uL   Basophils Relative 1 %   Basophils Absolute 0.1 0.0 - 0.1 K/uL   Immature Granulocytes 0 %   Abs Immature Granulocytes 0.02 0.00 - 0.07 K/uL    Comment: Performed at [facility], [address] [place], [place] [address]  Comprehensive metabolic panel     Status: None   Collection Time: 08/17/23  4:39 AM  Result Value Ref Range   Sodium 140 135 - 145 mmol/L   Potassium 3.9 3.5 - 5.1 mmol/L   Chloride 107 98 - 111 mmol/L   CO2 25 22 - 32 mmol/L   Glucose, Bld 91 70 - 99 mg/dL    Comment: Glucose reference range applies only to samples taken after fasting for at least 8 hours.   BUN 13 6 - 20 mg/dL   Creatinine, Ser 6.21 0.44 - 1.00 mg/dL   Calcium 9.7 8.9 - 30.8 mg/dL   Total Protein 6.6 6.5 - 8.1 g/dL   Albumin 4.0 3.5 - 5.0 g/dL   AST 24 15 - 41 U/L   ALT 16 0 - 44 U/L   Alkaline Phosphatase 115 38 - 126 U/L   Total Bilirubin 0.4 0.0 - 1.2 mg/dL   GFR, Estimated >65 >78 mL/min    Comment: (NOTE) Calculated using the CKD-EPI Creatinine Equation (2021)    Anion gap 8 5 - 15    Comment: Performed at [facility], [address] [place], [place] [address]  Hemoglobin A1c     Status: Abnormal   Collection Time: 08/17/23  4:39 AM  Result Value Ref Range   Hgb A1c MFr Bld 5.7 (H) 4.8 - 5.6 %    Comment: (NOTE) Pre diabetes:          5.7%-6.4%  Diabetes:              >6.4%  Glycemic control for   <7.0% adults with diabetes    Mean Plasma Glucose 116.89 mg/dL    Comment:  Performed at [facility], [address] [place], [place] [address]  Lipid panel     Status: Abnormal   Collection Time: 08/17/23  4:39 AM  Result Value Ref Range   Cholesterol 189 0 - 200 mg/dL   Triglycerides 132 <440 mg/dL   HDL 58 >10 mg/dL   Total CHOL/HDL Ratio 3.3 RATIO   VLDL 26 0 - 40 mg/dL   LDL Cholesterol 272 (H) 0 - 99 mg/dL    Comment:        Total Cholesterol/HDL:CHD Risk Coronary Heart Disease Risk Table                     Men   Women  1/2 Average Risk   3.4   3.3  Average Risk       5.0   4.4  2 X Average Risk   9.6   7.1  3 X Average Risk  23.4   11.0  Use the calculated Patient Ratio above and the CHD Risk Table to determine the patient's CHD Risk.        ATP III CLASSIFICATION (LDL):  <100     mg/dL   Optimal  161-096  mg/dL   Near or Above                    Optimal  130-159  mg/dL   Borderline  045-409  mg/dL   High  >811     mg/dL   Very High Performed at [facility], [address], [place], [place] [address]   TSH     Status: None   Collection Time: 08/17/23  4:39 AM  Result Value Ref Range   TSH 1.770 0.350 - 4.500 uIU/mL    Comment: Performed by a 3rd Generation assay with a functional sensitivity of <=0.01 uIU/mL. Performed at [facility], [address], [place], [place] [address]   POCT Urine Drug Screen - (I-Screen)     Status: Abnormal   Collection Time: 08/17/23  4:40 AM  Result Value Ref Range   POC Amphetamine UR None Detected NONE DETECTED (Cut Off Level 1000 ng/mL)   POC Secobarbital (BAR) None Detected NONE DETECTED (Cut Off Level 300 ng/mL)   POC Buprenorphine (BUP) None Detected NONE DETECTED (Cut Off Level 10 ng/mL)   POC Oxazepam (BZO) Positive (A) NONE DETECTED (Cut Off Level 300 ng/mL)   POC Cocaine UR None Detected NONE DETECTED (Cut Off Level 300 ng/mL)   POC Methamphetamine UR None Detected NONE DETECTED (Cut Off Level 1000 ng/mL)   POC Morphine None Detected NONE DETECTED (Cut Off Level 300  ng/mL)   POC Methadone UR None Detected NONE DETECTED (Cut Off Level 300 ng/mL)   POC Oxycodone UR None Detected NONE DETECTED (Cut Off Level 100 ng/mL)   POC Marijuana UR Positive (A) NONE DETECTED (Cut Off Level 50 ng/mL)  Pregnancy, urine POC     Status: None   Collection Time: 08/17/23  4:48 AM  Result Value Ref Range   Preg Test, Ur NEGATIVE NEGATIVE    Comment:        THE SENSITIVITY OF THIS METHODOLOGY IS >24 mIU/mL    Blood Alcohol level:  No results found for: "ETH"  Metabolic Disorder Labs:  Lab Results  Component Value Date   HGBA1C 5.7 (H) 08/17/2023   MPG 116.89 08/17/2023   No results found for: "PROLACTIN" Lab Results  Component Value Date   CHOL 189 08/17/2023   TRIG 130 08/17/2023   HDL 58 08/17/2023   CHOLHDL 3.3 08/17/2023   VLDL 26 08/17/2023   LDLCALC 105 (H) 08/17/2023   Current Medications: Current Facility-Administered Medications  Medication Dose Route Frequency Provider Last Rate Last Admin   acetaminophen (TYLENOL) tablet 650 mg  650 mg Oral Q6H PRN Marlou Sa, NP       alum & mag hydroxide-simeth (MAALOX/MYLANTA) 200-200-20 MG/5ML suspension 30 mL  30 mL Oral Q4H PRN Rayburn Go, Veronique M, NP       [START ON 08/19/2023] amLODipine (NORVASC) tablet 10 mg  10 mg Oral Daily Attiah, Nadir, MD       ARIPiprazole (ABILIFY) tablet 2 mg  2 mg Oral Daily Andreus Cure C, FNP       atorvastatin (LIPITOR) tablet 40 mg  40 mg Oral Daily Rayburn Go, Veronique M, NP   40 mg at 08/18/23 0819   haloperidol (HALDOL) tablet 5 mg  5 mg Oral TID  PRN Marlou Sa, NP       And   diphenhydrAMINE (BENADRYL) capsule 50 mg  50 mg Oral TID PRN Marlou Sa, NP       haloperidol lactate (HALDOL) injection 10 mg  10 mg Intramuscular TID PRN Marlou Sa, NP       And   diphenhydrAMINE (BENADRYL) injection 50 mg  50 mg Intramuscular TID PRN Marlou Sa, NP       And   LORazepam (ATIVAN) injection 2 mg  2 mg Intramuscular TID PRN  Marlou Sa, NP       haloperidol lactate (HALDOL) injection 5 mg  5 mg Intramuscular TID PRN Marlou Sa, NP       And   diphenhydrAMINE (BENADRYL) injection 50 mg  50 mg Intramuscular TID PRN Marlou Sa, NP       And   LORazepam (ATIVAN) injection 2 mg  2 mg Intramuscular TID PRN Marlou Sa, NP       hydrOXYzine (ATARAX) tablet 25 mg  25 mg Oral TID PRN Marlou Sa, NP   25 mg at 08/17/23 2350   irbesartan (AVAPRO) tablet 150 mg  150 mg Oral Daily Henryk Ursin, Jesusita Oka, FNP       letrozole [facility]) tablet 2.5 mg  2.5 mg Oral Daily Byungura, Veronique M, NP       magnesium hydroxide (MILK OF MAGNESIA) suspension 30 mL  30 mL Oral Daily PRN Rayburn Go, Veronique M, NP       melatonin tablet 3 mg  3 mg Oral QHS PRN Marlou Sa, NP   3 mg at 08/17/23 2350   [START ON 08/19/2023] mirtazapine (REMERON) tablet 30 mg  30 mg Oral QHS Mccartney Brucks, Jesusita Oka, FNP       pantoprazole (PROTONIX) EC tablet 40 mg  40 mg Oral Daily Rayburn Go, Veronique M, NP   40 mg at 08/18/23 5284   propranolol ER (INDERAL LA) 24 hr capsule 120 mg  120 mg Oral Daily Marlou Sa, NP       PTA Medications: Medications Prior to Admission  Medication Sig Dispense Refill Last Dose/Taking   valsartan (DIOVAN) 160 MG tablet Take 160 mg by mouth daily.   08/18/2023   amLODipine (NORVASC) 10 MG tablet Take 10 mg by mouth daily.      atorvastatin (LIPITOR) 40 MG tablet Take 40 mg by mouth daily.      betamethasone dipropionate 0.05 % cream Apply 1 Application topically 2 (two) times daily as needed (apply to affected area).      diazepam (VALIUM) 2 MG tablet Take 2-4 mg by mouth 3 (three) times daily as needed.      Dupilumab (DUPIXENT) 300 MG/2ML SOPN Inject 300 mg into the muscle every 14 (fourteen) days.      gabapentin (NEURONTIN) 300 MG capsule Take 600 mg by mouth at bedtime as needed (for pain).      letrozole (FEMARA) 2.5 MG tablet Take 2.5 mg by mouth daily.       mirtazapine (REMERON) 30 MG tablet Take 30 mg by mouth daily.      nortriptyline (PAMELOR) 50 MG capsule Take 100 mg by mouth at bedtime.      ondansetron (ZOFRAN) 4 MG tablet Take 4 mg by mouth every 8 (eight) hours as needed.      pantoprazole (PROTONIX) 40 MG tablet Take 80 mg by mouth daily.      propranolol ER (INDERAL LA) 120 MG  24 hr capsule Take 120 mg by mouth daily.      Musculoskeletal: Strength & Muscle Tone: within normal limits Gait & Station: normal Patient leans: N/A  Psychiatric Specialty Exam:  Presentation  General Appearance:  Casual; Appropriate for Environment; Fairly Groomed  Eye Contact: Fair  Speech: Clear and Coherent  Speech Volume: Normal  Handedness: Right   Mood and Affect  Mood: Depressed; Anxious  Affect: Congruent; Tearful; Depressed  Thought Process  Thought Processes: Coherent  Duration of Psychotic Symptoms:N/A Past Diagnosis of Schizophrenia or Psychoactive disorder: No  Descriptions of Associations:Intact  Orientation:Full (Time, Place and Person)  Thought Content:Logical; WDL  Hallucinations:Hallucinations: None  Ideas of Reference:None  Suicidal Thoughts:Suicidal Thoughts: No SI Active Intent and/or Plan: -- (Denies)  Homicidal Thoughts:Homicidal Thoughts: No  Sensorium  Memory: Immediate Good; Recent Good  Judgment: Fair  Insight: Fair  Art therapist  Concentration: Fair  Attention Span: Fair  Recall: Fair  Fund of Knowledge: Fair  Language: Good  Psychomotor Activity  Psychomotor Activity: Psychomotor Activity: Normal  Assets  Assets: Communication Skills; Desire for Improvement; Housing; Physical Health; Resilience  Sleep  Sleep: Sleep: Good Number of Hours of Sleep: 6  Physical Exam: Physical Exam Vitals and nursing note reviewed.  HENT:     Head: Normocephalic.     Nose: Nose normal.     Mouth/Throat:     Mouth: Mucous membranes are moist.     Pharynx:  Oropharynx is clear.  Eyes:     Extraocular Movements: Extraocular movements intact.  Cardiovascular:     Rate and Rhythm: Normal rate.     Pulses: Normal pulses.  Pulmonary:     Effort: Pulmonary effort is normal.  Abdominal:     Comments: Deferred  Genitourinary:    Comments: Deferred Musculoskeletal:        General: Normal range of motion.     Cervical back: Normal range of motion.  Skin:    General: Skin is warm.  Neurological:     General: No focal deficit present.     Mental Status: She is alert and oriented to person, place, and time.  Psychiatric:        Mood and Affect: Mood normal.        Behavior: Behavior normal.    Review of Systems  Constitutional:  Negative for chills and fever.  HENT:  Negative for hearing loss and sore throat.   Eyes:  Negative for blurred vision and double vision.  Respiratory:  Negative for cough, sputum production, shortness of breath and wheezing.   Cardiovascular:  Negative for chest pain and palpitations.  Gastrointestinal:  Negative for abdominal pain, constipation, diarrhea, heartburn, nausea and vomiting.  Genitourinary:  Negative for dysuria, frequency and urgency.  Musculoskeletal:  Negative for falls.  Skin:  Negative for rash.  Neurological:  Negative for dizziness and headaches.  Endo/Heme/Allergies:        See allergy listing  Psychiatric/Behavioral:  Positive for depression and substance abuse. Negative for hallucinations and suicidal ideas. The patient is nervous/anxious. The patient does not have insomnia.    Blood pressure (!) 128/93, pulse 89, temperature 97.7 F (36.5 C), temperature source Oral, resp. rate 16, height 5\' 5"  (1.651 m), weight 87.9 kg, SpO2 99%. Body mass index is 32.25 kg/m.  Treatment Plan Summary: Daily contact with patient to assess and evaluate symptoms and progress in treatment and Medication management  Physician Treatment Plan for Primary Diagnosis: Assessment:  Massar is a 38 year-old AA  female with prior  psychiatric diagnoses significant for MDD recurrent severe, adjustment disorder with anxiety and depression, and suicidal ideation.  Patient presents voluntarily to [facility] from [facility] for worsening depression with suicidal ideation with plans to crash her car by herself or run into other people, or overdose with medications in the context of altercation with her husband.   MDD (major depressive disorder), recurrent episode (HCC)  Plans: Medications: --Discontinue nortriptyline due to side effect of compulsion and hallucination -- Initiate Abilify tablet 2 mg p.o. to augment antidepressant -- Continue Remeron tablets 30 mg p.o. q. nightly for depression and sleep -- Continue hydroxyzine tablets 25 mg p.o. 3 times daily as needed for anxiety.  Medications for other medical problems: -- Amlodipine tablet 10 mg p.o. daily for high blood pressure -- Lipitor tablet 40 mg p.o. daily for hyperlipidemia -- Avapro tablet 150 mg p.o. daily for high blood pressure -- For Femara tablet 2.5 mg p.o. daily for hormone regulation -- Melatonin tablet 3 mg p.o. daily nightly as needed for insomnia -- Protonix EC tablet 40 mg p.o. daily for GERD -- Inderal LA 24-hour capsule 120 mg p.o. daily for high blood pressure  Agitation protocol: Benadryl capsule 50 mg p.o. or IM 3 times daily as needed agitation   Haldol tablets 5 mg po IM 3 times daily as needed agitation   Lorazepam tablet 2 mg p.o. or IM 3 times daily as needed agitation    Other PRN Medications -Acetaminophen 650 mg every 6 as needed/mild pain -Maalox 30 mL oral every 4 as needed/digestion -Magnesium hydroxide 30 mL daily as needed/mild constipation  --The risks/benefits/side-effects/alternatives to this medication were discussed in detail with the patient and time was given for questions. The patient consents to medication trial.  -- Metabolic profile and EKG monitoring obtained while on  an atypical antipsychotic (BMI: Lipid Panel: HbgA1c: QTc:)  -- Encouraged patient to participate in unit milieu and in scheduled group therapies   Admission reviewed: CMP: No metabolic dysregulation.  Lipid panel: LDL 105 elevated.  CBC with differential: RBC 5.34 elevated, MCV 74.3 low, MCH 23.4 low, RDW 15.9 elevated.  Otherwise normal.  Hemoglobin A1c: 5.7 slight elevation.  Pregnancy test: Negative.  TSH: 1.770 within normal limits.  UDS: Positive for benzos and marijuana.  New labs ordered: Vitamin D 25-hydroxy  EKG reviewed: The EKG obtained   Safety and Monitoring: Voluntary admission to inpatient psychiatric unit for safety, stabilization and treatment Daily contact with patient to assess and evaluate symptoms and progress in treatment Patient's case to be discussed in multi-disciplinary team meeting Observation Level : q15 minute checks Vital signs: q12 hours Precautions: suicide, but pt currently verbally contracts for safety on unit    Discharge Planning: Social work and case management to assist with discharge planning and identification of hospital follow-up needs prior to discharge Estimated LOS: 5-7 days Discharge Concerns: Need to establish a safety plan; Medication compliance and effectiveness Discharge Goals: Return home with outpatient referrals for mental health follow-up including medication management/psychotherapy.   Long Term Goal(s): Improvement in symptoms so as ready for discharge  Short Term Goals: Ability to identify changes in lifestyle to reduce recurrence of condition will improve, Ability to verbalize feelings will improve, Ability to disclose and discuss suicidal ideas, Ability to demonstrate self-control will improve, Ability to identify and develop effective coping behaviors will improve, Ability to maintain clinical measurements within normal limits will improve, Compliance with prescribed medications will improve, and Ability to identify triggers  associated  with substance abuse/mental health issues will improve  Physician Treatment Plan for Secondary Diagnosis: Principal Problem:   MDD (major depressive disorder), recurrent episode (HCC)  I certify that inpatient services furnished can reasonably be expected to improve the patient's condition.    Cecilie Lowers, FNP 3/5/20251:42 PM

## 2023-08-18 NOTE — Plan of Care (Signed)
°  Problem: Education: °Goal: Emotional status will improve °Outcome: Progressing °  °Problem: Activity: °Goal: Sleeping patterns will improve °Outcome: Progressing °  °Problem: Safety: °Goal: Periods of time without injury will increase °Outcome: Progressing °  °

## 2023-08-18 NOTE — Tx Team (Signed)
 Initial Treatment Plan 08/18/2023 3:31 AM [name] OZD:[id number]    PATIENT STRESSORS: Financial difficulties   Health problems   Marital or family conflict     PATIENT STRENGTHS: Average or above average intelligence  Capable of independent living  Communication skills  General fund of knowledge  Supportive family/friends    PATIENT IDENTIFIED PROBLEMS: "Anger management issues"  "Not coping well with bad news about length of recovery time from cancer treatment"                   DISCHARGE CRITERIA:  Improved stabilization in mood, thinking, and/or behavior Motivation to continue treatment in a less acute level of care Verbal commitment to aftercare and medication compliance  PRELIMINARY DISCHARGE PLAN: Outpatient therapy Participate in family therapy Return to previous living arrangement  PATIENT/FAMILY INVOLVEMENT: This treatment plan has been presented to and reviewed with the patient, [name], and/or family member.  The patient and family have been given the opportunity to ask questions and make suggestions.  Alethia Berthold, RN 08/18/2023, 3:31 AM

## 2023-08-18 NOTE — Progress Notes (Signed)
 1:1 Note: Patient maintained on constant supervision for safety.  Patient is calm and sitting on the bed.  Denies suicidal thoughts, auditory and visual hallucinations.  Patient ate 50 % of her lunch with adequate fluid intake.  Reports frequent hot flashes.  Routine safety checks maintained.  Patient is safe on the unit with supervision.  Support and encouragement offered as needed.

## 2023-08-18 NOTE — Progress Notes (Signed)
   08/17/23 2300  Psych Admission Type (Psych Patients Only)  Admission Status Voluntary  Psychosocial Assessment  Patient Complaints Anger;Anxiety;Depression  Eye Contact Fair  Facial Expression Pensive;Anxious  Affect Anxious  Speech Logical/coherent  Interaction Assertive  Motor Activity Restless  Appearance/Hygiene Unremarkable  Behavior Characteristics Anxious  Mood Depressed;Anxious  Aggressive Behavior  Targets Family (Husband during argument)  Type of Behavior Provoked or triggered;Verbal;Striking out  Effect No apparent injury  Thought Process  Coherency WDL  Content WDL  Delusions None reported or observed  Perception WDL  Hallucination None reported or observed  Judgment Poor  Confusion None  Danger to Self  Current suicidal ideation? Denies (Denies at this time.)  Description of Suicide Plan Denies  Self-Injurious Behavior  (none)  Agreement Not to Harm Self Yes  Description of Agreement Verbal  Danger to Others  Danger to Others None reported or observed

## 2023-08-18 NOTE — [facility] Suicide Risk Assessment (Signed)
 Suicide Risk Assessment  Admission Assessment    [facility] Admission Suicide Risk Assessment  Nursing information obtained from:  Patient Demographic factors:  Unemployed (Medical: recovery from breast cancer. Marital issues.) Current Mental Status:  Suicidal ideation indicated by patient Loss Factors:  Decline in physical health, Financial problems / change in socioeconomic status Historical Factors:  NA Risk Reduction Factors:  Responsible for children under 3 years of age, Positive social support, Sense of responsibility to family, Living with another person, especially a relative  Total Time spent with patient: 1.5 hours Principal Problem: MDD (major depressive disorder), recurrent episode (HCC) Diagnosis:  Principal Problem:   MDD (major depressive disorder), recurrent episode (HCC)  Subjective Data:  [name] is a 38 year-old AA female with prior psychiatric diagnoses significant for MDD recurrent severe, adjustment disorder with anxiety and depression, and suicidal ideation.  Patient presents voluntarily to [facility] from [facility] for worsening depression with suicidal ideation with plans to crash her car by herself or run into other people, or overdose with medications in the context of altercation with her husband.   Continued Clinical Symptoms:  Alcohol Use Disorder Identification Test Final Score (AUDIT): 0 The "Alcohol Use Disorders Identification Test", Guidelines for Use in Primary Care, Second Edition.  World Science writer [facility]). Score between 0-7:  no or low risk or alcohol related problems. Score between 8-15:  moderate risk of alcohol related problems. Score between 16-19:  high risk of alcohol related problems. Score 20 or above:  warrants further diagnostic evaluation for alcohol dependence and treatment.  CLINICAL FACTORS:   Severe Anxiety and/or Agitation Depression:   Aggression Anhedonia Impulsivity Severe Alcohol/Substance  Abuse/Dependencies More than one psychiatric diagnosis Unstable or Poor Therapeutic Relationship Previous Psychiatric Diagnoses and Treatments  Musculoskeletal: Strength & Muscle Tone: within normal limits Gait & Station: normal Patient leans: N/A  Psychiatric Specialty Exam:  Presentation  General Appearance:  Casual; Appropriate for Environment; Fairly Groomed  Eye Contact: Fair  Speech: Clear and Coherent  Speech Volume: Normal  Handedness: Right  Mood and Affect  Mood: Depressed; Anxious  Affect: Congruent; Tearful; Depressed  Thought Process  Thought Processes: Coherent  Descriptions of Associations:Intact  Orientation:Full (Time, Place and Person)  Thought Content:Logical; WDL  History of Schizophrenia/Schizoaffective disorder:No  Duration of Psychotic Symptoms:No data recorded Hallucinations:Hallucinations: None  Ideas of Reference:None  Suicidal Thoughts:Suicidal Thoughts: No SI Active Intent and/or Plan: -- (Denies)  Homicidal Thoughts:Homicidal Thoughts: No  Sensorium  Memory: Immediate Good; Recent Good  Judgment: Fair  Insight: Fair  Art therapist  Concentration: Fair  Attention Span: Fair  Recall: Fair  Fund of Knowledge: Fair  Language: Good  Psychomotor Activity  Psychomotor Activity: Psychomotor Activity: Normal  Assets  Assets: Communication Skills; Desire for Improvement; Housing; Physical Health; Resilience  Sleep  Sleep: Sleep: Good Number of Hours of Sleep: 6  Physical Exam: Physical Exam Vitals and nursing note reviewed.  Constitutional:      Appearance: She is normal weight.  HENT:     Head: Normocephalic.     Nose: Nose normal.     Mouth/Throat:     Mouth: Mucous membranes are moist.     Pharynx: Oropharynx is clear.  Eyes:     Extraocular Movements: Extraocular movements intact.  Cardiovascular:     Rate and Rhythm: Normal rate.     Pulses: Normal pulses.  Pulmonary:      Effort: Pulmonary effort is normal.  Abdominal:     Comments: Deferred  Genitourinary:  Comments: Deferred Musculoskeletal:        General: Normal range of motion.     Cervical back: Normal range of motion.  Skin:    General: Skin is warm.  Neurological:     General: No focal deficit present.     Mental Status: She is alert and oriented to person, place, and time.  Psychiatric:        Mood and Affect: Mood normal.        Behavior: Behavior normal.        Thought Content: Thought content normal.    Review of Systems  Constitutional:  Negative for chills and fever.       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   Eyes:  Negative for blurred vision.  Respiratory:  Negative for cough, sputum production, shortness of breath and wheezing.   Cardiovascular:  Negative for chest pain and palpitations.  Gastrointestinal:  Negative for abdominal pain, constipation, diarrhea, heartburn, nausea and vomiting.  Genitourinary:  Negative for dysuria, frequency and urgency.  Musculoskeletal:  Negative for falls.  Skin:  Negative for itching and rash.  Neurological:  Negative for dizziness and headaches.  Endo/Heme/Allergies:        See allergic listing  Psychiatric/Behavioral:  Positive for depression. Negative for hallucinations, memory loss, substance abuse and suicidal ideas. The patient is nervous/anxious. The patient does not have insomnia.    Blood pressure (!) 132/106, pulse 88, temperature 97.7 F (36.5 C), temperature source Oral, resp. rate 16, height 5\' 5"  (1.651 m), weight 87.9 kg, SpO2 98%. Body mass index is 32.25 kg/m.  COGNITIVE FEATURES THAT CONTRIBUTE TO RISK:  Polarized thinking    SUICIDE RISK:   Severe:  Frequent, intense, and enduring suicidal ideation, specific plan, no subjective intent, but some objective markers of intent (i.e., choice of lethal method), the method is accessible, some limited  preparatory behavior, evidence of impaired self-control, severe dysphoria/symptomatology, multiple risk factors present, and few if any protective factors, particularly a lack of social support.  PLAN OF CARE: Physician Treatment Plan for Primary Diagnosis: Assessment:  Sergent is a 38 year-old AA female with prior psychiatric diagnoses significant for MDD recurrent severe, adjustment disorder with anxiety and depression, and suicidal ideation.  Patient presents voluntarily to [facility] from [facility] for worsening depression with suicidal ideation with plans to crash her car by herself or run into other people, or overdose with medications in the context of altercation with her husband.   MDD (major depressive disorder), recurrent episode (HCC)  Plans: Medications: --Discontinue nortriptyline due to side effect of compulsion and hallucination -- Initiate Abilify tablet 2 mg p.o. to augment antidepressant -- Continue Remeron tablets 30 mg p.o. q. nightly for depression and sleep -- Continue hydroxyzine tablets 25 mg p.o. 3 times daily as needed for anxiety.  Medications for other medical problems: -- Amlodipine tablet 10 mg p.o. daily for high blood pressure -- Lipitor tablet 40 mg p.o. daily for hyperlipidemia -- Avapro tablet 150 mg p.o. daily for high blood pressure -- For Femara tablet 2.5 mg p.o. daily for hormone regulation -- Melatonin tablet 3 mg p.o. daily nightly as needed for insomnia -- Protonix EC tablet 40 mg p.o. daily for GERD -- Inderal LA 24-hour capsule 120 mg p.o. daily for high blood pressure  Agitation protocol: Benadryl capsule 50 mg p.o. or IM 3 times daily as needed agitation   Haldol tablets  5 mg po IM 3 times daily as needed agitation   Lorazepam tablet 2 mg p.o. or IM 3 times daily as needed agitation    Other PRN Medications -Acetaminophen 650 mg every 6 as needed/mild pain -Maalox 30 mL oral every 4 as  needed/digestion -Magnesium hydroxide 30 mL daily as needed/mild constipation  --The risks/benefits/side-effects/alternatives to this medication were discussed in detail with the patient and time was given for questions. The patient consents to medication trial.  -- Metabolic profile and EKG monitoring obtained while on an atypical antipsychotic (BMI: Lipid Panel: HbgA1c: QTc:)  -- Encouraged patient to participate in unit milieu and in scheduled group therapies   Admission reviewed: CMP: No metabolic dysregulation.  Lipid panel: LDL 105 elevated.  CBC with differential: RBC 5.34 elevated, MCV 74.3 low, MCH 23.4 low, RDW 15.9 elevated.  Otherwise normal.  Hemoglobin A1c: 5.7 slight elevation.  Pregnancy test: Negative.  TSH: 1.770 within normal limits.  UDS: Positive for benzos and marijuana.  New labs ordered: Vitamin D 25-hydroxy  EKG reviewed: The EKG obtained   Safety and Monitoring: Voluntary admission to inpatient psychiatric unit for safety, stabilization and treatment Daily contact with patient to assess and evaluate symptoms and progress in treatment Patient's case to be discussed in multi-disciplinary team meeting Observation Level : q15 minute checks Vital signs: q12 hours Precautions: suicide, but pt currently verbally contracts for safety on unit    Discharge Planning: Social work and case management to assist with discharge planning and identification of hospital follow-up needs prior to discharge Estimated LOS: 5-7 days Discharge Concerns: Need to establish a safety plan; Medication compliance and effectiveness Discharge Goals: Return home with outpatient referrals for mental health follow-up including medication management/psychotherapy.  Long Term Goal(s): Improvement in symptoms so as ready for discharge  Short Term Goals: Ability to identify changes in lifestyle to reduce recurrence of condition will improve, Ability to verbalize feelings will improve, Ability to  disclose and discuss suicidal ideas, Ability to demonstrate self-control will improve, Ability to identify and develop effective coping behaviors will improve, Ability to maintain clinical measurements within normal limits will improve, Compliance with prescribed medications will improve, and Ability to identify triggers associated with substance abuse/mental health issues will improve  Physician Treatment Plan for Secondary Diagnosis: Principal Problem:   MDD (major depressive disorder), recurrent episode (HCC)   I certify that inpatient services furnished can reasonably be expected to improve the patient's condition.   Cecilie Lowers, FNP 08/18/2023, 1:31 PM

## 2023-08-18 NOTE — BH IP Treatment Plan (Signed)
 Interdisciplinary Treatment and Diagnostic Plan Update  08/18/2023 Time of Session: 10:30 AM [name] MRN: [id number]  Principal Diagnosis: MDD (major depressive disorder), recurrent episode (HCC)  Secondary Diagnoses: Principal Problem:   MDD (major depressive disorder), recurrent episode (HCC)   Current Medications:  Current Facility-Administered Medications  Medication Dose Route Frequency Provider Last Rate Last Admin   acetaminophen (TYLENOL) tablet 650 mg  650 mg Oral Q6H PRN Marlou Sa, NP       alum & mag hydroxide-simeth (MAALOX/MYLANTA) 200-200-20 MG/5ML suspension 30 mL  30 mL Oral Q4H PRN Rayburn Go, Veronique M, NP       [START ON 08/19/2023] amLODipine (NORVASC) tablet 10 mg  10 mg Oral Daily Attiah, Nadir, MD       atorvastatin (LIPITOR) tablet 40 mg  40 mg Oral Daily Rayburn Go, Veronique M, NP   40 mg at 08/18/23 8657   haloperidol (HALDOL) tablet 5 mg  5 mg Oral TID PRN Marlou Sa, NP       And   diphenhydrAMINE (BENADRYL) capsule 50 mg  50 mg Oral TID PRN Marlou Sa, NP       haloperidol lactate (HALDOL) injection 10 mg  10 mg Intramuscular TID PRN Marlou Sa, NP       And   diphenhydrAMINE (BENADRYL) injection 50 mg  50 mg Intramuscular TID PRN Marlou Sa, NP       And   LORazepam (ATIVAN) injection 2 mg  2 mg Intramuscular TID PRN Rayburn Go, Veronique M, NP       haloperidol lactate (HALDOL) injection 5 mg  5 mg Intramuscular TID PRN Marlou Sa, NP       And   diphenhydrAMINE (BENADRYL) injection 50 mg  50 mg Intramuscular TID PRN Marlou Sa, NP       And   LORazepam (ATIVAN) injection 2 mg  2 mg Intramuscular TID PRN Marlou Sa, NP       hydrOXYzine (ATARAX) tablet 25 mg  25 mg Oral TID PRN Marlou Sa, NP   25 mg at 08/17/23 2350   letrozole [facility]) tablet 2.5 mg  2.5 mg Oral Daily Rayburn Go, Veronique M, NP       magnesium hydroxide (MILK OF MAGNESIA) suspension  30 mL  30 mL Oral Daily PRN Rayburn Go, Veronique M, NP       melatonin tablet 3 mg  3 mg Oral QHS PRN Marlou Sa, NP   3 mg at 08/17/23 2350   mirtazapine (REMERON) tablet 30 mg  30 mg Oral Daily Marlou Sa, NP       nortriptyline (PAMELOR) capsule 100 mg  100 mg Oral Daily Byungura, Veronique M, NP       pantoprazole (PROTONIX) EC tablet 40 mg  40 mg Oral Daily Rayburn Go, Veronique M, NP   40 mg at 08/18/23 8469   propranolol ER (INDERAL LA) 24 hr capsule 120 mg  120 mg Oral Daily Marlou Sa, NP       PTA Medications: Medications Prior to Admission  Medication Sig Dispense Refill Last Dose/Taking   amLODipine (NORVASC) 10 MG tablet Take 10 mg by mouth daily.      atorvastatin (LIPITOR) 40 MG tablet Take 40 mg by mouth daily.      betamethasone dipropionate 0.05 % cream Apply 1 Application topically 2 (two) times daily as needed (apply to affected area).      diazepam (VALIUM) 2 MG tablet Take 2-4 mg by mouth  3 (three) times daily as needed.      Dupilumab (DUPIXENT) 300 MG/2ML SOPN Inject 300 mg into the muscle every 14 (fourteen) days.      gabapentin (NEURONTIN) 300 MG capsule Take 600 mg by mouth at bedtime as needed (for pain).      letrozole (FEMARA) 2.5 MG tablet Take 2.5 mg by mouth daily.      mirtazapine (REMERON) 30 MG tablet Take 30 mg by mouth daily.      nortriptyline (PAMELOR) 50 MG capsule Take 100 mg by mouth at bedtime.      ondansetron (ZOFRAN) 4 MG tablet Take 4 mg by mouth every 8 (eight) hours as needed.      pantoprazole (PROTONIX) 40 MG tablet Take 80 mg by mouth daily.      propranolol ER (INDERAL LA) 120 MG 24 hr capsule Take 120 mg by mouth daily.      valsartan (DIOVAN) 160 MG tablet Take 160 mg by mouth daily.       Patient Stressors: Financial difficulties   Health problems   Marital or family conflict    Patient Strengths: Average or above average intelligence  Capable of independent living  Communication skills  General  fund of knowledge  Supportive family/friends   Treatment Modalities: Medication Management, Group therapy, Case management,  1 to 1 session with clinician, Psychoeducation, Recreational therapy.   Physician Treatment Plan for Primary Diagnosis: MDD (major depressive disorder), recurrent episode (HCC) Long Term Goal(s):     Short Term Goals:    Medication Management: Evaluate patient's response, side effects, and tolerance of medication regimen.  Therapeutic Interventions: 1 to 1 sessions, Unit Group sessions and Medication administration.  Evaluation of Outcomes: Not Progressing  Physician Treatment Plan for Secondary Diagnosis: Principal Problem:   MDD (major depressive disorder), recurrent episode (HCC)  Long Term Goal(s):     Short Term Goals:       Medication Management: Evaluate patient's response, side effects, and tolerance of medication regimen.  Therapeutic Interventions: 1 to 1 sessions, Unit Group sessions and Medication administration.  Evaluation of Outcomes: Not Progressing   RN Treatment Plan for Primary Diagnosis: MDD (major depressive disorder), recurrent episode (HCC) Long Term Goal(s): Knowledge of disease and therapeutic regimen to maintain health will improve  Short Term Goals: Ability to remain free from injury will improve, Ability to verbalize frustration and anger appropriately will improve, Ability to demonstrate self-control, Ability to participate in decision making will improve, Ability to verbalize feelings will improve, Ability to disclose and discuss suicidal ideas, Ability to identify and develop effective coping behaviors will improve, and Compliance with prescribed medications will improve  Medication Management: RN will administer medications as ordered by provider, will assess and evaluate patient's response and provide education to patient for prescribed medication. RN will report any adverse and/or side effects to prescribing  provider.  Therapeutic Interventions: 1 on 1 counseling sessions, Psychoeducation, Medication administration, Evaluate responses to treatment, Monitor vital signs and CBGs as ordered, Perform/monitor CIWA, COWS, AIMS and Fall Risk screenings as ordered, Perform wound care treatments as ordered.  Evaluation of Outcomes: Not Progressing   LCSW Treatment Plan for Primary Diagnosis: MDD (major depressive disorder), recurrent episode (HCC) Long Term Goal(s): Safe transition to appropriate next level of care at discharge, Engage patient in therapeutic group addressing interpersonal concerns.  Short Term Goals: Engage patient in aftercare planning with referrals and resources, Increase social support, Increase ability to appropriately verbalize feelings, Increase emotional regulation, Facilitate acceptance of mental health  diagnosis and concerns, Facilitate patient progression through stages of change regarding substance use diagnoses and concerns, Identify triggers associated with mental health/substance abuse issues, and Increase skills for wellness and recovery  Therapeutic Interventions: Assess for all discharge needs, 1 to 1 time with Social worker, Explore available resources and support systems, Assess for adequacy in community support network, Educate family and significant other(s) on suicide prevention, Complete Psychosocial Assessment, Interpersonal group therapy.  Evaluation of Outcomes: Not Progressing   Progress in Treatment: Attending groups: Patient just arrived at the hospital, and the groups haven't been scheduled yet Participating in groups: N/A Taking medication as prescribed: Yes. Toleration medication: Yes. Family/Significant other contact made: No, consents are pending Patient understands diagnosis: Yes. Discussing patient identified problems/goals with staff: Yes. Medical problems stabilized or resolved: Yes. Denies suicidal/homicidal ideation: Yes. Issues/concerns per  patient self-inventory: No.  New problem(s) identified:  No  New Short Term/Long Term Goal(s):  Patient recently admitted. CSW will continue to follow and assess for appropriate referrals and possible discharge planning.   Patient Goals:  "I've been going through a lot:  cancer, I quit a job a week ago, and my husband texted someone."  Discharge Plan or Barriers:  Patient recently admitted. CSW will continue to follow and assess for appropriate referrals and possible discharge planning.   Reason for Continuation of Hospitalization: Anxiety Depression Medication stabilization Suicidal ideation  Estimated Length of Stay:  5 - 7 days  Last 3 [place] Suicide Severity Risk Score: Flowsheet Row [facility]Current[facility] Most recent reading at 08/17/2023 11:00 PM ED from 08/17/2023 in [facility] Most recent reading at 08/17/2023 10:57 AM Pre-Admission Testing 45 from 04/24/2021 in [facility] PRE [facility]MISSION TEST[facility] Most recent reading at 04/24/2021 11:22 AM  C-SSRS RISK CATEGORY High Risk High Risk No Risk       Last PHQ 2/9 Scores:    08/17/2023   10:57 AM  Depression screen PHQ 2/9  Decreased Interest 1  Down, Depressed, Hopeless 1  PHQ - 2 Score 2  Altered sleeping 1  Tired, decreased energy 1  Change in appetite 1  Feeling bad or failure about yourself  2  Trouble concentrating 2  Moving slowly or fidgety/restless 2  Suicidal thoughts 2  PHQ-9 Score 13  Difficult doing work/chores Very difficult    Scribe for Treatment Team: Nyra Jabs 08/18/2023 12:39 PM

## 2023-08-18 NOTE — [facility] Group Notes (Signed)
Patient did not attend the NA group. 

## 2023-08-18 NOTE — Progress Notes (Signed)
 Admission Note:  38 year old female who presents Voluntarily in no acute distress for the treatment of SI with plan to overdose and Depression. Patient appears anxious and depressed. Patient was anxious and irritable, but cooperative with admission process.   [Per [facility] Assessment: Patient states " I am ready to die, I am not scared to die, its got to be better than here". Patient is appropriately dressed  and groomed. She is alert and oriented x 4. Her thought process is coherent and goal directed. Her eye contact is fair.  Patient is anxious, depressed and hopeless. She reports that she prefers to die  as her husband does not want her anymore ""he treats me like garbage".  Patient reports that yesterday, after finding his text messages with his daughter's mother, patient punched the window glass, then left the house. On her way to [facility], patient felt like losing it and called 911. She did not want to wreck the car  "because its my daughter's car, and I just bought it for her". Patient  reports that she is feeling like giving up on life "because it is hard to live with someone who does not  care about me". Patient reports that she has wanted to end her life a couple of times "but I think about my children, I didn't want to leave them in pain".]  Patient denies SI/HI/AVH at this time and contracts for safety upon admission. Patient has past medical Hx of Breast Cancer, HTN, GERD, and Hyperlipidemia. Denies pain or discomfort, no apparent signs of distress. Psych Hx of MDD, PTSD, Anxiety, and Depression. Reports current stressors are marital problems, recently quitting her job, and dealing with "bad news that recovery may take at least a year." Skin was assessed and found to be clear of any abnormal marks apart from tattoos on back and forearms, and an Abd surgical scar. Patient searched and no contraband found, plan of care and unit policies explained and understanding verbalized. Consents obtained. Food and  fluids offered, and fluids accepted. Patient had no additional questions or concerns.

## 2023-08-18 NOTE — Progress Notes (Signed)
 1:1 Note: Patient placed on constant supervision for safety.  Patient complained of dizziness, fatigue, poor appetite, and has not eating x 3 days.  Patient ambulated to the bathroom with assistance.  Denies suicidal thoughts, auditory and visual hallucinations.  Fluid intake offered and encouraged.  Routine safety checks maintained.  Patient is safe on the unit with supervision.

## 2023-08-19 ENCOUNTER — Ambulatory Visit
Admission: RE | Admit: 2023-08-19 | Discharge: 2023-08-19 | Disposition: A | Discharge status: Home / Self Care | Payer: BC Managed Care – PPO | Source: Ambulatory Visit | Attending: Radiation Oncology | Admitting: Radiation Oncology

## 2023-08-19 ENCOUNTER — Telehealth: Payer: Self-pay

## 2023-08-19 LAB — VITAMIN D 25 HYDROXY (VIT D DEFICIENCY, FRACTURES): Vit D, 25-Hydroxy: 26.38 ng/mL — ABNORMAL LOW (ref 30–100)

## 2023-08-19 MED ORDER — ONDANSETRON 4 MG PO TBDP
4.0000 mg | ORAL_TABLET | Freq: Three times a day (TID) | ORAL | Status: DC | PRN
  Administered 2023-08-19: 4 mg via ORAL

## 2023-08-19 MED ORDER — ARIPIPRAZOLE 5 MG PO TABS
5.0000 mg | ORAL_TABLET | Freq: Every day | ORAL | Status: DC
  Administered 2023-08-20 – 2023-08-21 (×2): 5 mg via ORAL
  Filled 2023-08-19 (×3): qty 1

## 2023-08-19 MED ORDER — ONDANSETRON 4 MG PO TBDP
ORAL_TABLET | ORAL | Status: AC
  Filled 2023-08-19: qty 1

## 2023-08-19 NOTE — Progress Notes (Signed)
 Nursing 1:1 note D: Patient observed awake in bed. RR even and unlabored. No distress noted. Patient used       bathroom at 2145  A: 1:1 observation continues for safety  R: Patient remains safe

## 2023-08-19 NOTE — [facility] Group Notes (Signed)
 [facility] Group Notes:  (Nursing/MHT/Case Management/Adjunct)  Date:  08/19/2023  Time:  9:01 PM  Type of Therapy:   Wrap-up group  Participation Level:  Active  Participation Quality:  Appropriate  Affect:  Appropriate  Cognitive:  Appropriate  Insight:  Appropriate  Engagement in Group:  Engaged  Modes of Intervention:  Education  Summary of Progress/Problems: Pt goal to go home. Not met. Rated her day 7/10.   Noah Delaine 08/19/2023, 9:01 PM

## 2023-08-19 NOTE — Progress Notes (Addendum)
   08/19/23 2105  Psych Admission Type (Psych Patients Only)  Admission Status Voluntary  Psychosocial Assessment  Patient Complaints Sleep disturbance;Sadness (d/t pain in legs from chemotherapy)  Eye Contact Fair  Facial Expression Other (Comment) (appropriate)  Affect Appropriate to circumstance  Speech Logical/coherent  Interaction Assertive  Motor Activity Other (Comment) (wnl)  Appearance/Hygiene Unremarkable  Behavior Characteristics Cooperative;Appropriate to situation  Mood Pleasant  Thought Process  Coherency WDL  Content WDL  Delusions None reported or observed  Perception WDL  Hallucination None reported or observed  Judgment WDL  Confusion None  Danger to Self  Current suicidal ideation? Denies  Agreement Not to Harm Self Yes  Description of Agreement verbal  Danger to Others  Danger to Others None reported or observed   Progress note   D: Pt seen at med window. Pt denies SI, HI, AVH. Pt rates pain  8/10 as a headache. Says it started after dinner. Pt also has pain in her legs that are a side effect of her chemo treatment for breast cancer. Pt denies anxiety and depression. States she had trouble staying asleep last night d/t the pain in her legs. "I know there is nothing they can really do about it. The doctors told me that." Pt is pleasant and cooperative. Discussed plan to deal with sleep disturbance should it occur tonight. Pt states she feels good today because she talked to her family. She misses her children. No other concerns noted at this time.  A: Pt provided support and encouragement. Pt given scheduled medication as prescribed. PRNs as appropriate. Q15 min checks for safety.   R: Pt safe on the unit. Will continue to monitor.

## 2023-08-19 NOTE — Progress Notes (Signed)
   08/19/23 0200  Psych Admission Type (Psych Patients Only)  Admission Status Voluntary  Psychosocial Assessment  Patient Complaints Anxiety;Depression (anxiety 7/10, depression 7/10)  Eye Contact Fair  Facial Expression Pensive;Anxious  Affect Anxious  Speech Logical/coherent  Interaction Assertive  Motor Activity Restless  Appearance/Hygiene Unremarkable  Behavior Characteristics Cooperative;Appropriate to situation  Mood Depressed;Anxious  Thought Process  Coherency WDL  Content WDL  Delusions None reported or observed  Perception WDL  Hallucination None reported or observed  Judgment WDL  Confusion None  Danger to Self  Current suicidal ideation? Denies (Denies at this time.)  Description of Suicide Plan Denies  Self-Injurious Behavior  (none)  Agreement Not to Harm Self Yes  Description of Agreement Verbal  Danger to Others  Danger to Others None reported or observed

## 2023-08-19 NOTE — Progress Notes (Signed)
 Nursing 1:1 note D: Patient observed sleeping in bed. RR even and unlabored. No distress noted.  A: 1:1 observation continues for safety  R: Patient remains safe

## 2023-08-19 NOTE — Plan of Care (Signed)
°  Problem: Education: Goal: Emotional status will improve Outcome: Progressing Goal: Mental status will improve Outcome: Progressing Goal: Verbalization of understanding the information provided will improve Outcome: Progressing   Problem: Activity: Goal: Interest or engagement in activities will improve Outcome: Progressing Goal: Sleeping patterns will improve Outcome: Progressing   Problem: Coping: Goal: Ability to demonstrate self-control will improve Outcome: Progressing   Problem: Safety: Goal: Periods of time without injury will increase Outcome: Progressing

## 2023-08-19 NOTE — Progress Notes (Signed)
 Nursing 1:1 note D: Patient observed awake in bed. RR even and unlabored. No distress noted. Patient up to      bathroom at this time  A: 1:1 observation continues for safety  R: Patient remains safe

## 2023-08-19 NOTE — Group Note (Signed)
 LCSW Group Therapy Note   Group Date: 08/19/2023 Start Time: 1100 End Time: 1200   Participation:  patient was present and actively participated in the discussion.  She was insightful and shared personal experiences.   Type of Therapy:  Group Therapy   Title:  [facility]: A Safe Space for Grief   Objective:  [facility]: A Safe Space for Grief aims to provide a compassionate environment where participants can process their grief, explore its stages, and create personal rituals to honor their loved ones.  Goals: Offer a supportive space for participants to share grief experiences without judgment. Educate on the stages of grief, emphasizing that healing is unique and non-linear. Introduce grief rituals as a way to honor loved ones and foster healing.  Therapeutic Modalities: Dialectical Behavior Therapy (DBT):  - Mindfulness: Teaching participants to stay present and non-judgmentally observe their emotions without becoming overwhelmed by them. - Emotional Regulation: Helping participants understand and manage difficult emotions like anger or sadness through skills like labeling emotions and practicing self-compassion.  Summary: [facility] creates a space to explore grief's personal nature, addressing the 5 stages (denial, anger, bargaining, depression, acceptance) and recognizing that grief is non-linear.  Encouraged participants to engage in activities that promote emotional well-being, even when they don't feel like it, to combat isolation and sadness.   [name] Armany Mano, LCSWA 08/19/2023  2:56 PM

## 2023-08-19 NOTE — Progress Notes (Signed)
 [name] presented at the med window with 1:1 sitter- with a bright affect- pleasant mood- no complaints other than bilateral leg pain, rated 6/10. Pt denied needing anything for pain, stating, "I've learned to live with it. It's from the chemotherapy, and my doctor said it will eventually go away."  Pt currently denies SI, stating, "My kids are everything." Pt denied dizziness at this time. A. Labs and vitals monitored. Pt given and educated on medications. Pt supported emotionally and encouraged to express concerns and ask questions R. Pt remains safe with 1:1 sitter and Q 15 minute checks. Will continue POC.

## 2023-08-19 NOTE — Plan of Care (Signed)
   Problem: Education: Goal: Emotional status will improve Outcome: Progressing Goal: Mental status will improve Outcome: Progressing

## 2023-08-19 NOTE — Progress Notes (Deleted)
   08/19/23 2105  Psych Admission Type (Psych Patients Only)  Admission Status Voluntary  Psychosocial Assessment  Patient Complaints Sleep disturbance (d/t pain in legs from chemotherapy)  Eye Contact Fair  Facial Expression Other (Comment) (appropriate)  Affect Appropriate to circumstance  Speech Logical/coherent  Interaction Assertive  Motor Activity Other (Comment) (wnl)  Appearance/Hygiene Unremarkable  Behavior Characteristics Cooperative;Appropriate to situation  Mood Pleasant  Thought Process  Coherency WDL  Content WDL  Delusions None reported or observed  Perception WDL  Hallucination None reported or observed  Judgment WDL  Confusion None  Danger to Self  Current suicidal ideation? Denies  Agreement Not to Harm Self Yes  Description of Agreement verbal  Danger to Others  Danger to Others None reported or observed
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
## 2023-08-19 NOTE — Progress Notes (Signed)
 [facility] MD Progress Note  08/19/2023 3:53 PM [name]  MRN:  [id number]  Subjective:  [name] states, "I am better today, I was able to talk to my husband and we made up.  We plan to continue with the family therapy after my discharge."  Principal Problem: MDD (major depressive disorder), recurrent episode (HCC) Diagnosis: Principal Problem:   MDD (major depressive disorder), recurrent episode (HCC)  Reason for admission:  Denz is a 38 year-old AA female with prior psychiatric diagnoses significant for MDD recurrent severe, adjustment disorder with anxiety and depression, and suicidal ideation.  Patient presents voluntarily to [facility] from [facility] for worsening depression with suicidal ideation with plans to crash her car by herself or run into other people, or overdose with medications in the context of altercation with her husband.   Yesterday the psychiatry team made the following recommendations: --Discontinue nortriptyline due to side effect of compulsion and hallucination -- Initiate Abilify tablet 2 mg p.o. daily to augment antidepressant -- Continue Remeron tablets 30 mg p.o. q. nightly for depression and sleep -- Continue hydroxyzine tablets 25 mg p.o. 3 times daily as needed for anxiety.   Today's assessment notes: On assessment today, the pt reports that her mood is less irritable and sad.  Rates depression as #3/10, with 10 being high severity.  Reports, " I sum up my courage and spoke with my husband last night.  We made up, and he said he loves me.  We are planning for family therapy after my discharge from the hospital."Patient is alert, oriented to person, time, place, and situation.  Mood is less labile today, with congruent affect.  Reports she is getting better overall.  Compliant with scheduled medication, with hydroxyzine, Tylenol, and melatonin required for as needed.  Denies any acute discomfort.  Further denies SI, HI, or  AVH.  Added, "I miss my children so much."The patient symptom continues to improve, patient may be discharged on Saturday, 08/21/2023. Reports that anxiety is manageable level Sleep is improved, nursing staff report patient sleeping 7.75 hours last night. Appetite is improving Concentration is better Energy level is adequate Denies suicidal thoughts.  Did not denies suicidal intent and plan.  Denies having any HI.  Denies having psychotic symptoms.   Denies having side effects to current psychiatric medications.   We discussed changes to current medication regimen, including increasing Abilify from 2 mg to 5 mg p.o. for augmentation of antidepressant.  Discussed the following psychosocial stressors: Support provided for ongoing stressors due to marital conflict.  Strongly encouraged family therapy after discharge from the hospital.  Education provided on cessation of chronic marijuana use, and use of benzodiazepine.  Patient verbalized understanding of the instructions.  Total Time spent with patient: 45 minutes  Past Psychiatric History: Previous Psych Diagnoses: Major depressive disorder recurrent, adjustment disorder with anxiety and depression Prior inpatient treatment: Denies Current/prior outpatient treatment: Patient is followed by [facility] Prior rehab hx: Denies Psychotherapy hx: Yes History of suicide: Denies History of homicide or aggression: Denies, apart from current incidents Psychiatric medication history: Psychiatric medication compliance history: Yes Neuromodulation history: Denies neuromodulation history Current Psychiatrist: He is currently seeing Dr. Evelene Croon Current therapist: He is currently being seen at [facility] counseling   Past Medical History:  Past Medical History:  Diagnosis Date   Abnormal Pap smear 08/14/2003   colpo   Anemia    History -FeSO4 SUPP IN PAST   Bronchospasm    Carpal  tunnel syndrome of right wrist    Diastasis recti  12/12/2008   GBS carrier    First pregnancy   GERD    zantac   H/O chest pain 06/17/11, 06/2013   Resolved 06/2013   H/O varicella    Headache(784.0)    migraines;D/T BP   Herpes    cold sores   History of CT scan of chest    a. Cardiac CTA 10/16: no CAD   History of echocardiogram    a. Echo 10/16: EF 50%, diff HK, mild TR, mild PI   History of kidney stones    passed stone - no surgery required   History of stress test    a. ETT-Echo 10/16: normal ECGs, + LAD area ischemia >> will arrange Cardiac CTA   HTN (hypertension)    Hyperlipidemia    Hypertension    Low BMI    LV dysfunction 12/2012, 06/2013   Hx: EF 45-50%, Resolved by Jan 2015 echo- EF 50-55%   Palpitations    Hx only - no problems   Panic attack    HAS BEEN RX'D XANAX   Pelvic pain    With pregnancy   Postpartum hypertension    Preeclampsia 2010,2011   POST PARTUM;WAS PUT ON BP MEDS   Preterm contractions 06/15/2005   GIVEN BETAMETHASONE AND PROCARDIA TO STOP CTXS   Right ovarian cyst 06/15/2001   RT OOPHRECTOMY   Spotting in first trimester    With SAB   Stress    work and at home - no meds   SVD (spontaneous vaginal delivery)    x 5    Past Surgical History:  Procedure Laterality Date   breast cyst removal  06/16/2003   right   DILATION AND CURETTAGE OF UTERUS  06/16/2007   DILITATION & CURRETTAGE/HYSTROSCOPY WITH THERMACHOICE ABLATION N/A 10/11/2013   Procedure: DILATATION & CURETTAGE/HYSTEROSCOPY WITH THERMACHOICE ABLATION;  Surgeon: Michael Litter, MD;  Location: [facility];  Service: Gynecology;  Laterality: N/A;   LAPAROSCOPIC OVARIAN CYSTECTOMY Left 04/25/2021   Procedure: LAPAROSCOPIC OVARIAN CYSTECTOMY and salpingectomy-oophorectomy;  Surgeon: Schermerhorn, Ihor Austin, MD;  Location: [facility] ORS;  Service: Gynecology;  Laterality: Left;   LAPAROSCOPY N/A 10/11/2013   Procedure: LAPAROSCOPY OPERATIVE WITH REMOVAL OF HYDROSALPINX ;  Surgeon: Michael Litter, MD;  Location: [facility];  Service: Gynecology;   Laterality: N/A;   ovary removed Right 2003   right - laparotomy   TUBAL LIGATION  07/03/2012   Procedure: POST PARTUM TUBAL LIGATION;  Surgeon: Purcell Nails, MD;  Location: [facility];  Service: Gynecology;  Laterality: Bilateral;  Bilateral post partum tubal ligation   WISDOM TOOTH EXTRACTION     Family History:  Family History  Problem Relation Age of Onset   Heart disease Mother        CHF   Hypertension Mother    Asthma Mother        CHILDHOOD   Diabetes Mother    Kidney disease Mother        dialysis   Depression Mother    Alcohol abuse Mother    Drug abuse Mother    Heart failure Mother    Hypertension Father    Alcohol abuse Father    Drug abuse Father    Asthma Sister    COPD Sister        chronic bronchitis   Depression Sister        ATTEMPTED SUICIDE   Arrhythmia Sister  needs ablation    Hypertension Maternal Grandmother    Diabetes Maternal Grandmother    Family Psychiatric  History: See H&P Social History:  Social History   Substance and Sexual Activity  Alcohol Use Not Currently     Social History   Substance and Sexual Activity  Drug Use Yes   Types: Marijuana   Comment: Episodic use    Social History   Socioeconomic History   Marital status: Married    Spouse name: Taurus   Number of children: 5   Years of education: 15   Highest education level: Not on file  Occupational History   Occupation: STUDENT   Occupation: Lexicographer: [employer]  Tobacco Use   Smoking status: Every Day    Current packs/day: 0.15    Types: Cigarettes   Smokeless tobacco: Never  Vaping Use   Vaping status: Never Used  Substance and Sexual Activity   Alcohol use: Not Currently   Drug use: Yes    Types: Marijuana    Comment: Episodic use   Sexual activity: Yes    Partners: Male    Birth control/protection: Surgical    Comment: tubal  Other Topics Concern   Not on file  Social History Narrative   Pt lives in [place]  with husband and children.  She is followed by Dr. Evelene Croon   Social Drivers of Health   Financial Resource Strain: High Risk (08/02/2023)   Received from [facility]   Overall Financial Resource Strain (CARDIA)    Difficulty of Paying Living Expenses: Hard  Food Insecurity: No Food Insecurity (08/18/2023)   Hunger Vital Sign    Worried About Running Out of Food in the Last Year: Never true    Ran Out of Food in the Last Year: Never true  Transportation Needs: No Transportation Needs (08/18/2023)   PRAPARE - Administrator, Civil Service (Medical): No    Lack of Transportation (Non-Medical): No  Physical Activity: Insufficiently Active (08/02/2023)   Received from [facility]   Exercise Vital Sign    Days of Exercise per Week: 5 days    Minutes of Exercise per Session: 20 min  Stress: Stress Concern Present (08/02/2023)   Received from [facility] Occupational [facility] - Occupational Stress Questionnaire    Feeling of Stress : Very much  Social Connections: Moderately Integrated (08/02/2023)   Received from [facility]   Social Network    How would you rate your social network (family, work, friends)?: Adequate participation with social networks   Additional Social History:    Sleep: Good  Appetite:  Good  Current Medications: Current Facility-Administered Medications  Medication Dose Route Frequency Provider Last Rate Last Admin   acetaminophen (TYLENOL) tablet 650 mg  650 mg Oral Q6H PRN Marlou Sa, NP   650 mg at 08/19/23 1252   alum & mag hydroxide-simeth (MAALOX/MYLANTA) 200-200-20 MG/5ML suspension 30 mL  30 mL Oral Q4H PRN Rayburn Go, Veronique M, NP       amLODipine (NORVASC) tablet 10 mg  10 mg Oral Daily Attiah, Nadir, MD   10 mg at 08/19/23 0644   [START ON 08/20/2023] ARIPiprazole (ABILIFY) tablet 5 mg  5 mg Oral Daily Symantha Steeber C, FNP       atorvastatin (LIPITOR) tablet 40 mg  40 mg Oral Daily Rayburn Go, Veronique M, NP   40  mg at 08/19/23 0834   haloperidol (HALDOL) tablet 5 mg  5 mg  Oral TID PRN Marlou Sa, NP       And   diphenhydrAMINE (BENADRYL) capsule 50 mg  50 mg Oral TID PRN Marlou Sa, NP       haloperidol lactate (HALDOL) injection 10 mg  10 mg Intramuscular TID PRN Marlou Sa, NP       And   diphenhydrAMINE (BENADRYL) injection 50 mg  50 mg Intramuscular TID PRN Marlou Sa, NP       And   LORazepam (ATIVAN) injection 2 mg  2 mg Intramuscular TID PRN Rayburn Go, Veronique M, NP       haloperidol lactate (HALDOL) injection 5 mg  5 mg Intramuscular TID PRN Marlou Sa, NP       And   diphenhydrAMINE (BENADRYL) injection 50 mg  50 mg Intramuscular TID PRN Marlou Sa, NP       And   LORazepam (ATIVAN) injection 2 mg  2 mg Intramuscular TID PRN Marlou Sa, NP       hydrOXYzine (ATARAX) tablet 25 mg  25 mg Oral TID PRN Marlou Sa, NP   25 mg at 08/18/23 2114   irbesartan (AVAPRO) tablet 150 mg  150 mg Oral Daily Cecilie Lowers, FNP   150 mg at 08/19/23 0835   letrozole [facility]) tablet 2.5 mg  2.5 mg Oral Daily Olin Pia M, NP   2.5 mg at 08/19/23 9147   magnesium hydroxide (MILK OF MAGNESIA) suspension 30 mL  30 mL Oral Daily PRN Marlou Sa, NP       melatonin tablet 3 mg  3 mg Oral QHS PRN Marlou Sa, NP   3 mg at 08/18/23 2114   mirtazapine (REMERON) tablet 30 mg  30 mg Oral QHS Lanika Colgate C, FNP       ondansetron (ZOFRAN-ODT) disintegrating tablet 4 mg  4 mg Oral Q8H PRN Abbott Pao, Nadir, MD   4 mg at 08/19/23 1309   pantoprazole (PROTONIX) EC tablet 40 mg  40 mg Oral Daily Rayburn Go, Veronique M, NP   40 mg at 08/19/23 0835   propranolol ER (INDERAL LA) 24 hr capsule 120 mg  120 mg Oral Daily Rayburn Go, Veronique M, NP   120 mg at 08/19/23 8295   Lab Results: No results found for this or any previous visit (from the past 48 hours).  Blood Alcohol level:  No results found for:  "ETH"  Metabolic Disorder Labs: Lab Results  Component Value Date   HGBA1C 5.7 (H) 08/17/2023   MPG 116.89 08/17/2023   No results found for: "PROLACTIN" Lab Results  Component Value Date   CHOL 189 08/17/2023   TRIG 130 08/17/2023   HDL 58 08/17/2023   CHOLHDL 3.3 08/17/2023   VLDL 26 08/17/2023   LDLCALC 105 (H) 08/17/2023    Physical Findings: AIMS:  , ,  ,  ,    CIWA:    COWS:     Musculoskeletal: Strength & Muscle Tone: within normal limits Gait & Station: normal Patient leans: N/A  Psychiatric Specialty Exam:  Presentation  General Appearance:  Appropriate for Environment; Casual  Eye Contact: Good  Speech: Clear and Coherent  Speech Volume: Normal  Handedness: Right  Mood and Affect  Mood: Anxious; Depressed  Affect: Appropriate  Thought Process  Thought Processes: Coherent  Descriptions of Associations:Intact  Orientation:Full (Time, Place and Person)  Thought Content:Logical  History of Schizophrenia/Schizoaffective disorder:No  Duration of Psychotic Symptoms:No data recorded Hallucinations:Hallucinations: None  Ideas of Reference:None  Suicidal Thoughts:Suicidal Thoughts: No SI Active Intent and/or Plan: -- (Denies)  Homicidal Thoughts:Homicidal Thoughts: No  Sensorium  Memory: Immediate Good; Recent Good  Judgment: Fair  Insight: Fair  Art therapist  Concentration: Good  Attention Span: Good  Recall: Good  Fund of Knowledge: Good  Language: Good  Psychomotor Activity  Psychomotor Activity: Psychomotor Activity: Normal  Assets  Assets: Communication Skills; Desire for Improvement; Housing; Physical Health  Sleep  Sleep: Sleep: Good Number of Hours of Sleep: 7.75  Physical Exam: Physical Exam Vitals and nursing note reviewed.  Constitutional:      General: She is not in acute distress.    Appearance: She is not toxic-appearing.  HENT:     Head: Normocephalic.     Nose: Nose  normal.     Mouth/Throat:     Mouth: Mucous membranes are moist.  Eyes:     Extraocular Movements: Extraocular movements intact.  Cardiovascular:     Rate and Rhythm: Normal rate.     Pulses: Normal pulses.  Pulmonary:     Effort: Pulmonary effort is normal.  Abdominal:     Comments: Deferred  Genitourinary:    Comments: Deferred Musculoskeletal:        General: Normal range of motion.     Cervical back: Normal range of motion.  Skin:    General: Skin is warm.  Neurological:     General: No focal deficit present.     Mental Status: She is alert and oriented to person, place, and time.  Psychiatric:        Mood and Affect: Mood normal.        Behavior: Behavior normal.        Thought Content: Thought content normal.    Review of Systems  Constitutional:  Negative for chills and fever.  HENT:  Negative for sore throat.   Eyes:  Negative for blurred vision.  Respiratory:  Negative for cough, sputum production, shortness of breath and wheezing.   Cardiovascular:  Negative for chest pain and palpitations.  Gastrointestinal:  Negative for abdominal pain, constipation, diarrhea, heartburn, nausea and vomiting.  Genitourinary:  Negative for dysuria, frequency and urgency.  Musculoskeletal:  Negative for falls.  Skin:  Negative for itching and rash.  Neurological:  Negative for dizziness and headaches.  Endo/Heme/Allergies:        See allergy listing  Psychiatric/Behavioral:  Positive for depression. Negative for hallucinations, memory loss, substance abuse and suicidal ideas. The patient is nervous/anxious. The patient does not have insomnia.    Blood pressure (!) 139/92, pulse 97, temperature 97.9 F (36.6 C), temperature source Oral, resp. rate 18, height 5\' 5"  (1.651 m), weight 87.9 kg, SpO2 100%. Body mass index is 32.25 kg/m.  Treatment Plan Summary: Daily contact with patient to assess and evaluate symptoms and progress in treatment and Medication management Physician  Treatment Plan for Primary Diagnosis: Assessment:  Elsberry is a 38 year-old AA female with prior psychiatric diagnoses significant for MDD recurrent severe, adjustment disorder with anxiety and depression, and suicidal ideation.  Patient presents voluntarily to [facility] from [facility] for worsening depression with suicidal ideation with plans to crash her car by herself or run into other people, or overdose with medications in the context of altercation with her husband.    MDD (major depressive disorder), recurrent episode (HCC)   Plans: Medications: --Discontinue nortriptyline due to side effect of compulsion and hallucination -- Increase Abilify tablet from 2 mg to 5 mg p.o.  daily to augment antidepressant -- Continue Remeron tablets 30 mg p.o. q. nightly for depression and sleep -- Continue hydroxyzine tablets 25 mg p.o. 3 times daily as needed for anxiety.   Medications for other medical problems: -- Amlodipine tablet 10 mg p.o. daily for high blood pressure -- Lipitor tablet 40 mg p.o. daily for hyperlipidemia -- Avapro tablet 150 mg p.o. daily for high blood pressure -- For Femara tablet 2.5 mg p.o. daily for hormone regulation -- Melatonin tablet 3 mg p.o. daily nightly as needed for insomnia -- Protonix EC tablet 40 mg p.o. daily for GERD -- Inderal LA 24-hour capsule 120 mg p.o. daily for high blood pressure   Agitation protocol: Benadryl capsule 50 mg p.o. or IM 3 times daily as needed agitation   Haldol tablets 5 mg po IM 3 times daily as needed agitation   Lorazepam tablet 2 mg p.o. or IM 3 times daily as needed agitation     Other PRN Medications -Acetaminophen 650 mg every 6 as needed/mild pain -Maalox 30 mL oral every 4 as needed/digestion -Magnesium hydroxide 30 mL daily as needed/mild constipation   --The risks/benefits/side-effects/alternatives to this medication were discussed in detail with the patient and time was given for  questions. The patient consents to medication trial.  -- Metabolic profile and EKG monitoring obtained while on an atypical antipsychotic (BMI: Lipid Panel: HbgA1c: QTc:)  -- Encouraged patient to participate in unit milieu and in scheduled group therapies    Admission reviewed: CMP: No metabolic dysregulation.  Lipid panel: LDL 105 elevated.  CBC with differential: RBC 5.34 elevated, MCV 74.3 low, MCH 23.4 low, RDW 15.9 elevated.  Otherwise normal.  Hemoglobin A1c: 5.7 slight elevation.  Pregnancy test: Negative.  TSH: 1.770 within normal limits.  UDS: Positive for benzos and marijuana.   New labs ordered: Vitamin D 25-hydroxy   EKG reviewed:  No EKG obtained   Safety and Monitoring: Voluntary admission to inpatient psychiatric unit for safety, stabilization and treatment Daily contact with patient to assess and evaluate symptoms and progress in treatment Patient's case to be discussed in multi-disciplinary team meeting Observation Level : q15 minute checks Vital signs: q12 hours Precautions: suicide, but pt currently verbally contracts for safety on unit    Discharge Planning: Social work and case management to assist with discharge planning and identification of hospital follow-up needs prior to discharge Estimated LOS: 5-7 days Discharge Concerns: Need to establish a safety plan; Medication compliance and effectiveness Discharge Goals: Return home with outpatient referrals for mental health follow-up including medication management/psychotherapy.   Long Term Goal(s): Improvement in symptoms so as ready for discharge   Short Term Goals: Ability to identify changes in lifestyle to reduce recurrence of condition will improve, Ability to verbalize feelings will improve, Ability to disclose and discuss suicidal ideas, Ability to demonstrate self-control will improve, Ability to identify and develop effective coping behaviors will improve, Ability to maintain clinical measurements within  normal limits will improve, Compliance with prescribed medications will improve, and Ability to identify triggers associated with substance abuse/mental health issues will improve   Physician Treatment Plan for Secondary Diagnosis: Principal Problem:   MDD (major depressive disorder), recurrent episode (HCC)    Cecilie Lowers, FNP 08/19/2023, 3:53 PM

## 2023-08-20 ENCOUNTER — Encounter (HOSPITAL_COMMUNITY): Payer: Self-pay | Admitting: Psychiatry

## 2023-08-20 DIAGNOSIS — F332 Major depressive disorder, recurrent severe without psychotic features: Secondary | ICD-10-CM | POA: Diagnosis not present

## 2023-08-20 MED ORDER — CLONIDINE HCL 0.1 MG PO TABS
0.1000 mg | ORAL_TABLET | Freq: Four times a day (QID) | ORAL | Status: DC | PRN
  Administered 2023-08-20 (×2): 0.1 mg via ORAL
  Filled 2023-08-20 (×2): qty 1

## 2023-08-20 MED ORDER — HYDROXYZINE HCL 50 MG PO TABS: 50.0000 mg | ORAL_TABLET | Freq: Three times a day (TID) | ORAL | Status: DC | PRN

## 2023-08-20 MED ORDER — LORAZEPAM 1 MG PO TABS
1.0000 mg | ORAL_TABLET | Freq: Once | ORAL | Status: AC
  Administered 2023-08-20: 1 mg via ORAL
  Filled 2023-08-20: qty 1

## 2023-08-20 NOTE — Plan of Care (Signed)

## 2023-08-20 NOTE — Progress Notes (Signed)
 D: Patient is alert, oriented, pleasant, and cooperative. Denies SI, HI, AVH, and verbally contracts for safety. Patient reports she slept poor last night without sleeping medication. She reports leg pain that she says "nothing can be done about" because "it's from receiving radiation". Patient reports her appetite as good, energy level as normal, and concentration as good. Patient rates her depression 0/10, hopelessness 0/10, and anxiety 1/10. Patient blood pressure is elevated. Patient reports headache and worrying.    A: Scheduled medications administered per MD order. PRN tylenol, clonidine, and hydroxyzine administered. Support provided. Patient educated on safety on the unit and medications. Routine safety checks every 15 minutes. Patient stated understanding to tell nurse about any new physical symptoms. Patient understands to tell staff of any needs.     R: No adverse drug reactions noted. Patient remains safe at this time and will continue to monitor.    08/20/23 1100  Psych Admission Type (Psych Patients Only)  Admission Status Voluntary  Psychosocial Assessment  Patient Complaints Sleep disturbance;Worrying  Eye Contact Fair  Facial Expression Animated  Affect Appropriate to circumstance  Speech Logical/coherent  Interaction Assertive  Motor Activity Restless  Appearance/Hygiene Unremarkable  Behavior Characteristics Appropriate to situation;Cooperative  Mood Pleasant  Thought Process  Coherency WDL  Content WDL  Delusions None reported or observed  Perception WDL  Hallucination None reported or observed  Judgment WDL  Confusion None  Danger to Self  Current suicidal ideation? Denies  Self-Injurious Behavior No self-injurious ideation or behavior indicators observed or expressed   Agreement Not to Harm Self Yes  Description of Agreement verbal  Danger to Others  Danger to Others None reported or observed

## 2023-08-20 NOTE — Plan of Care (Signed)
   Problem: Education: Goal: Emotional status will improve Outcome: Progressing Goal: Mental status will improve Outcome: Progressing   Problem: Activity: Goal: Interest or engagement in activities will improve Outcome: Progressing Goal: Sleeping patterns will improve Outcome: Progressing   Problem: Safety: Goal: Periods of time without injury will increase Outcome: Progressing

## 2023-08-20 NOTE — Group Note (Signed)
 Recreation Therapy Group Note   Group Topic:Leisure Education  Group Date: 08/20/2023 Start Time: 0930 End Time: 1059 Facilitators: Callan Norden-McCall, LRT,CTRS Location: [address] Dayroom   Group Topic: Leisure Education  Goal Area(s) Addresses:  Patient will identify positive leisure activities for use post discharge. Patient will identify at least one positive benefit of participation in leisure activities.  Patient will work effectively work with peers to complete activity.  Intervention: [employer], Music   Activity: Patients had to get in a circle and hit a beach ball to each like volleyball. LRT would time the patients to see how long they could keep the ball in play. It the ball were to come to a complete stop at any point, the time would be restarted. Patients could let the ball bounce or roll on the floor or chair as long as it didn't stop moving.  Education:  Leisure Scientist, physiological, Special educational needs teacher, Teamwork, Discharge Planning  Education Outcome: Acknowledges education/In group clarification offered/Needs additional education.    Affect/Mood: N/A   Participation Level: Did not attend    Clinical Observations/Individualized Feedback:      Plan: Continue to engage patient in RT group sessions 2-3x/week.   Herndon Grill-McCall, LRT,CTRS 08/20/2023 12:34 PM

## 2023-08-20 NOTE — Progress Notes (Addendum)
 [facility] MD Progress Note  08/20/2023 9:58 AM [name]  MRN:  [id number]  Subjective:  [name] states, "I am better today, I was able to talk to my husband and we made up.  We plan to continue with the family therapy after my discharge."  Principal Problem: MDD (major depressive disorder), recurrent episode (HCC) Diagnosis: Principal Problem:   MDD (major depressive disorder), recurrent episode (HCC)  Reason for admission:  [name] is a 38 year-old AA female with prior psychiatric diagnoses significant for MDD recurrent severe, adjustment disorder with anxiety and depression, and suicidal ideation.  Patient presents voluntarily to [facility] from [facility] for worsening depression with suicidal ideation with plans to crash her car by herself or run into other people, or overdose with medications in the context of altercation with her husband.    Chart reviewed today. Patient discussed at multidisciplinary team meeting. Nursing staff reported persistent elevated blood pressures. She slept for 8.25 hours. She is compliant with routine psychotropic medication regimen. She required as needed Tylenol for headache, according to nursing record.     Information obtained during today's assessment: Patient evaluated face-to-face by this provider.  On approach, the patient was observed sitting in the day room among peers and agreed to a private evaluation.  She reports difficulty sleeping the previous night due to chronic leg pain. She reports improvement in her appetite.  She reports significant improvement in depression since admission. She denies suicidal ideation, self-harm urges, homicidal thoughts, and psychotic symptoms.  The patient states she has an external therapist and psychiatrist. She states her husband visited yesterday, providing support, and they plan to undergo marital counseling.  She reports attending and participating in group sessions on the unit,  finding them beneficial.  She states she has acquired coping skills, including acknowledging and addressing emotions directly and practicing deep breathing exercises.  The patient is future oriented and states she was looking forward to attending her son's championship game tonight.  She shares that she has 1 son in college studying to be a Midwife, another son graduating high school this year, and 72 year old daughter who is almost as tall as she is. She identifies her children and her husband as protective factors, emphasizing her desire to remain stable to make her children proud. 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  She denies side effects from psychotropic medications but reports intermittent headaches, attributing them to her cancer medication.  She states her oncologist informed her that headaches are a symptom.  She states she took Tylenol yesterday and the previous night which provided relief.  The patient reports she experienced heightened anxiety this morning due to missing her children, and a missed doctor's appointment, coinciding with elevated blood pressure. She is asymptomatic at the time of this evaluation. She states both her parents had hypertension, and she missed doses of her hypertension medication during hospital admission.  We discussed the importance of adhering to her psychotropic medication regimen post-discharge and attending follow-up appointments to mitigate the risk of readmission. Support provided for ongoing stressors due to marital conflict.  Strongly encouraged family therapy after discharge from the hospital.  Education provided on cessation of chronic marijuana use, and use of benzodiazepine. Patient verbalized understanding of the instructions.     Case discussed with the attending psychiatrist, N. Abbott Pao. Due to heightened anxiety and elevated blood pressure, a one-time dose of Ativan 1 mg orally will be ordered. Clonidine 0.1 mg as needed every  6 hours  with blood pressure parameters will be added. Hydroxyzine will be increased to 50 mg 3 times daily.    Discharge planning: The projected discharge date is set for tomorrow, March 8, contingent upon no safety concerns from family and safety planning coordinated by social work.  We discussed changes to the current medication regimen: -- Increase hydroxyzine from 25 mg to 50 mg p.o. 3 times daily as needed for anxiety. -- Ativan tablet 1 mg, oral, one-time immediate dose, anxiety, may also be beneficial for elevated blood pressure  --Start clonidine tablet 0.1 mg, oral, every 6 hours as needed, SBP >160 and/or DBP >100.   The following medication adjustments were made yesterday: - Increase Abilify from 2 mg to 5 mg p.o. for augmentation of antidepressant.     Total Time spent with patient: 45 minutes  Past Psychiatric History: Previous Psych Diagnoses: Major depressive disorder recurrent, adjustment disorder with anxiety and depression Prior inpatient treatment: Denies Current/prior outpatient treatment: Patient is followed by [facility] Prior rehab hx: Denies Psychotherapy hx: Yes History of suicide: Denies History of homicide or aggression: Denies, apart from current incidents Psychiatric medication history: Psychiatric medication compliance history: Yes Neuromodulation history: Denies neuromodulation history Current Psychiatrist: He is currently seeing Dr. Evelene Croon Current therapist: He is currently being seen at [facility] counseling   Past Medical History:  Past Medical History:  Diagnosis Date   Abnormal Pap smear 08/14/2003   colpo   Anemia    History -FeSO4 SUPP IN PAST   Bronchospasm    Carpal tunnel syndrome of right wrist    Diastasis recti 12/12/2008   GBS carrier    First pregnancy   GERD    zantac   H/O chest pain 06/17/11, 06/2013   Resolved 06/2013   H/O varicella    Headache(784.0)    migraines;D/T BP   Herpes    cold sores   History of CT scan of  chest    a. Cardiac CTA 10/16: no CAD   History of echocardiogram    a. Echo 10/16: EF 50%, diff HK, mild TR, mild PI   History of kidney stones    passed stone - no surgery required   History of stress test    a. ETT-Echo 10/16: normal ECGs, + LAD area ischemia >> will arrange Cardiac CTA   HTN (hypertension)    Hyperlipidemia    Hypertension    Low BMI    LV dysfunction 12/2012, 06/2013   Hx: EF 45-50%, Resolved by Jan 2015 echo- EF 50-55%   Palpitations    Hx only - no problems   Panic attack    HAS BEEN RX'D XANAX   Pelvic pain    With pregnancy   Postpartum hypertension    Preeclampsia 2010,2011   POST PARTUM;WAS PUT ON BP MEDS   Preterm contractions 06/15/2005   GIVEN BETAMETHASONE AND PROCARDIA TO STOP CTXS   Right ovarian cyst 06/15/2001   RT OOPHRECTOMY   Spotting in first trimester    With SAB   Stress    work and at home - no meds   SVD (spontaneous vaginal delivery)    x 5    Past Surgical History:  Procedure Laterality Date   breast cyst removal  06/16/2003   right   DILATION AND CURETTAGE OF UTERUS  06/16/2007   DILITATION & CURRETTAGE/HYSTROSCOPY WITH THERMACHOICE ABLATION N/A 10/11/2013   Procedure: DILATATION & CURETTAGE/HYSTEROSCOPY WITH THERMACHOICE ABLATION;  Surgeon: Michael Litter, MD;  Location: [facility];  Service: Gynecology;  Laterality: N/A;   LAPAROSCOPIC OVARIAN CYSTECTOMY Left 04/25/2021   Procedure: LAPAROSCOPIC OVARIAN CYSTECTOMY and salpingectomy-oophorectomy;  Surgeon: Schermerhorn, Ihor Austin, MD;  Location: [facility] ORS;  Service: Gynecology;  Laterality: Left;   LAPAROSCOPY N/A 10/11/2013   Procedure: LAPAROSCOPY OPERATIVE WITH REMOVAL OF HYDROSALPINX ;  Surgeon: Michael Litter, MD;  Location: [facility];  Service: Gynecology;  Laterality: N/A;   ovary removed Right 2003   right - laparotomy   TUBAL LIGATION  07/03/2012   Procedure: POST PARTUM TUBAL LIGATION;  Surgeon: Purcell Nails, MD;  Location: [facility];  Service: Gynecology;  Laterality:  Bilateral;  Bilateral post partum tubal ligation   WISDOM TOOTH EXTRACTION     Family History:  Family History  Problem Relation Age of Onset   Heart disease Mother        CHF   Hypertension Mother    Asthma Mother        CHILDHOOD   Diabetes Mother    Kidney disease Mother        dialysis   Depression Mother    Alcohol abuse Mother    Drug abuse Mother    Heart failure Mother    Hypertension Father    Alcohol abuse Father    Drug abuse Father    Asthma Sister    COPD Sister        chronic bronchitis   Depression Sister        ATTEMPTED SUICIDE   Arrhythmia Sister        needs ablation    Hypertension Maternal Grandmother    Diabetes Maternal Grandmother    Family Psychiatric  History: See H&P Social History:  Social History   Substance and Sexual Activity  Alcohol Use Not Currently     Social History   Substance and Sexual Activity  Drug Use Yes   Types: Marijuana   Comment: Episodic use    Social History   Socioeconomic History   Marital status: Married    Spouse name: Taurus   Number of children: 5   Years of education: 15   Highest education level: Not on file  Occupational History   Occupation: STUDENT   Occupation: Lexicographer: [employer]  Tobacco Use   Smoking status: Every Day    Current packs/day: 0.15    Types: Cigarettes   Smokeless tobacco: Never  Vaping Use   Vaping status: Never Used  Substance and Sexual Activity   Alcohol use: Not Currently   Drug use: Yes    Types: Marijuana    Comment: Episodic use   Sexual activity: Yes    Partners: Male    Birth control/protection: Surgical    Comment: tubal  Other Topics Concern   Not on file  Social History Narrative   Pt lives in [place] with husband and children.  She is followed by Dr. Evelene Croon   Social Drivers of Health   Financial Resource Strain: High Risk (08/02/2023)   Received from [facility]   Overall Financial Resource Strain (CARDIA)     Difficulty of Paying Living Expenses: Hard  Food Insecurity: No Food Insecurity (08/18/2023)   Hunger Vital Sign    Worried About Running Out of Food in the Last Year: Never true    Ran Out of Food in the Last Year: Never true  Transportation Needs: No Transportation Needs (08/18/2023)   PRAPARE - Administrator, Civil Service (Medical): No    Lack of Transportation (  Non-Medical): No  Physical Activity: Insufficiently Active (08/02/2023)   Received from [facility]   Exercise Vital Sign    Days of Exercise per Week: 5 days    Minutes of Exercise per Session: 20 min  Stress: Stress Concern Present (08/02/2023)   Received from [facility] - Occupational Stress Questionnaire    Feeling of Stress : Very much  Social Connections: Moderately Integrated (08/02/2023)   Received from [facility]   Social Network    How would you rate your social network (family, work, friends)?: Adequate participation with social networks   Additional Social History:    Sleep: Good  Appetite:  Good  Current Medications: Current Facility-Administered Medications  Medication Dose Route Frequency Provider Last Rate Last Admin   acetaminophen (TYLENOL) tablet 650 mg  650 mg Oral Q6H PRN Marlou Sa, NP   650 mg at 08/19/23 2105   alum & mag hydroxide-simeth (MAALOX/MYLANTA) 200-200-20 MG/5ML suspension 30 mL  30 mL Oral Q4H PRN Marlou Sa, NP       amLODipine (NORVASC) tablet 10 mg  10 mg Oral Daily Abbott Pao, Nadir, MD   10 mg at 08/20/23 0811   ARIPiprazole (ABILIFY) tablet 5 mg  5 mg Oral Daily Ntuen, Jesusita Oka, FNP   5 mg at 08/20/23 0811   atorvastatin (LIPITOR) tablet 40 mg  40 mg Oral Daily Marlou Sa, NP   40 mg at 08/20/23 1610   haloperidol (HALDOL) tablet 5 mg  5 mg Oral TID PRN Marlou Sa, NP       And   diphenhydrAMINE (BENADRYL) capsule 50 mg  50 mg Oral TID PRN Marlou Sa, NP        haloperidol lactate (HALDOL) injection 10 mg  10 mg Intramuscular TID PRN Marlou Sa, NP       And   diphenhydrAMINE (BENADRYL) injection 50 mg  50 mg Intramuscular TID PRN Marlou Sa, NP       And   LORazepam (ATIVAN) injection 2 mg  2 mg Intramuscular TID PRN Marlou Sa, NP       haloperidol lactate (HALDOL) injection 5 mg  5 mg Intramuscular TID PRN Marlou Sa, NP       And   diphenhydrAMINE (BENADRYL) injection 50 mg  50 mg Intramuscular TID PRN Marlou Sa, NP       And   LORazepam (ATIVAN) injection 2 mg  2 mg Intramuscular TID PRN Rayburn Go, Veronique M, NP       hydrOXYzine (ATARAX) tablet 25 mg  25 mg Oral TID PRN Marlou Sa, NP   25 mg at 08/18/23 2114   irbesartan (AVAPRO) tablet 150 mg  150 mg Oral Daily Ntuen, Jesusita Oka, FNP   150 mg at 08/20/23 0811   letrozole [facility]) tablet 2.5 mg  2.5 mg Oral Daily Rayburn Go, Veronique M, NP   2.5 mg at 08/20/23 9604   magnesium hydroxide (MILK OF MAGNESIA) suspension 30 mL  30 mL Oral Daily PRN Marlou Sa, NP       melatonin tablet 3 mg  3 mg Oral QHS PRN Marlou Sa, NP   3 mg at 08/18/23 2114   mirtazapine (REMERON) tablet 30 mg  30 mg Oral QHS Ntuen, Tina C, FNP   30 mg at 08/19/23 2105   ondansetron (ZOFRAN-ODT) disintegrating tablet 4 mg  4 mg Oral Q8H PRN Sarita Bottom, MD   4 mg  at 08/19/23 1309   pantoprazole (PROTONIX) EC tablet 40 mg  40 mg Oral Daily Marlou Sa, NP   40 mg at 08/20/23 1610   propranolol ER (INDERAL LA) 24 hr capsule 120 mg  120 mg Oral Daily Byungura, Veronique M, NP   120 mg at 08/20/23 9604   Lab Results:  Results for orders placed or performed during the hospital encounter of 08/17/23 (from the past 48 hours)  VITAMIN D 25 Hydroxy (Vit-D Deficiency, Fractures)     Status: Abnormal   Collection Time: 08/19/23  6:52 PM  Result Value Ref Range   Vit D, 25-Hydroxy 26.38 (L) 30 - 100 ng/mL    Comment: (NOTE) Vitamin D  deficiency has been defined by the Institute of Medicine  and an Endocrine Society practice guideline as a level of serum 25-OH  vitamin D less than 20 ng/mL (1,2). The Endocrine Society went on to  further define vitamin D insufficiency as a level between 21 and 29  ng/mL (2).  1. IOM (Institute of Medicine). 2010. Dietary reference intakes for  calcium and D. [place]: The [employer]. 2. Holick MF, Binkley [place], Bischoff-Ferrari HA, et al. Evaluation,  treatment, and prevention of vitamin D deficiency: an Endocrine  Society clinical practice guideline, JCEM. 2011 Jul; 96(7): 1911-30.  Performed at [facility], [address], [place], [place] [address]     Blood Alcohol level:  No results found for: "ETH"  Metabolic Disorder Labs: Lab Results  Component Value Date   HGBA1C 5.7 (H) 08/17/2023   MPG 116.89 08/17/2023   No results found for: "PROLACTIN" Lab Results  Component Value Date   CHOL 189 08/17/2023   TRIG 130 08/17/2023   HDL 58 08/17/2023   CHOLHDL 3.3 08/17/2023   VLDL 26 08/17/2023   LDLCALC 105 (H) 08/17/2023    Physical Findings: AIMS:  , ,  ,  ,    CIWA:    COWS:     Musculoskeletal: Strength & Muscle Tone: within normal limits Gait & Station: normal Patient leans: N/A  Psychiatric Specialty Exam:  Presentation  General Appearance:  Appropriate for Environment; Casual  Eye Contact: Good  Speech: Clear and Coherent  Speech Volume: Normal  Handedness: Right  Mood and Affect  Mood: Anxious; Depressed  Affect: Appropriate  Thought Process  Thought Processes: Coherent  Descriptions of Associations:Intact  Orientation:Full (Time, Place and Person)  Thought Content:Logical  History of Schizophrenia/Schizoaffective disorder:No  Duration of Psychotic Symptoms:No data recorded Hallucinations:Hallucinations: None  Ideas of Reference:None  Suicidal Thoughts:Suicidal Thoughts: No SI Active Intent  and/or Plan: -- (Denies)  Homicidal Thoughts:Homicidal Thoughts: No  Sensorium  Memory: Immediate Good; Recent Good  Judgment: Fair  Insight: Fair  Art therapist  Concentration: Good  Attention Span: Good  Recall: Good  Fund of Knowledge: Good  Language: Good  Psychomotor Activity  Psychomotor Activity: Psychomotor Activity: Normal  Assets  Assets: Communication Skills; Desire for Improvement; Housing; Physical Health  Sleep  Sleep: Sleep: Good Number of Hours of Sleep: 7.75  Physical Exam: Physical Exam Vitals and nursing note reviewed.  Constitutional:      General: She is not in acute distress.    Appearance: She is not toxic-appearing.  HENT:     Head: Normocephalic.     Nose: Nose normal.     Mouth/Throat:     Mouth: Mucous membranes are moist.  Eyes:     Extraocular Movements: Extraocular movements intact.  Cardiovascular:  Rate and Rhythm: Normal rate.     Pulses: Normal pulses.  Pulmonary:     Effort: Pulmonary effort is normal.  Abdominal:     Comments: Deferred  Genitourinary:    Comments: Deferred Musculoskeletal:        General: Normal range of motion.     Cervical back: Normal range of motion.  Skin:    General: Skin is warm.  Neurological:     General: No focal deficit present.     Mental Status: She is alert and oriented to person, place, and time.    Review of Systems  Constitutional:  Negative for chills and fever.  HENT:  Negative for sore throat.   Eyes:  Negative for blurred vision.  Respiratory:  Negative for cough, sputum production, shortness of breath and wheezing.   Cardiovascular:  Negative for chest pain and palpitations.  Gastrointestinal:  Negative for abdominal pain, constipation, diarrhea, heartburn, nausea and vomiting.  Genitourinary:  Negative for dysuria, frequency and urgency.  Musculoskeletal:  Negative for falls.  Skin:  Negative for itching and rash.  Neurological:  Negative for  dizziness and headaches.  Endo/Heme/Allergies:        See allergy listing  Psychiatric/Behavioral:  Positive for depression. Negative for hallucinations, memory loss, substance abuse and suicidal ideas. The patient is nervous/anxious. The patient does not have insomnia.    Blood pressure (!) 141/113, pulse (!) 58, temperature 97.9 F (36.6 C), temperature source Oral, resp. rate 18, height 5\' 5"  (1.651 m), weight 87.9 kg, SpO2 97%. Body mass index is 32.25 kg/m.  Treatment Plan Summary: Daily contact with patient to assess and evaluate symptoms and progress in treatment and Medication management Physician Treatment Plan for Primary Diagnosis: Assessment:  [name] is a 38 year-old AA female with prior psychiatric diagnoses significant for MDD recurrent severe, adjustment disorder with anxiety and depression, and suicidal ideation.  Patient presents voluntarily to [facility] from [facility] for worsening depression with suicidal ideation with plans to crash her car by herself or run into other people, or overdose with medications in the context of altercation with her husband.    MDD (major depressive disorder), recurrent episode (HCC)   Plans: Medications: --Discontinue nortriptyline due to side effect of compulsion and hallucination -- Continue Abilify tablet 5 mg p.o. daily to augment antidepressant -- Continue Remeron tablets 30 mg p.o. q. nightly for depression and sleep -- Increase hydroxyzine tablet 25 mg to 50 mg p.o. 3 times daily as needed for anxiety.  -- Ativan tablet 1 mg, oral, one-time immediate dose, anxiety, may also be beneficial for elevated blood pressure   Medications for other medical problems: -- Amlodipine tablet 10 mg p.o. daily for high blood pressure -- Lipitor tablet 40 mg p.o. daily for hyperlipidemia -- Avapro tablet 150 mg p.o. daily for high blood pressure -- Femara tablet 2.5 mg p.o. daily for hormone regulation -- Melatonin  tablet 3 mg p.o. daily nightly as needed for insomnia -- Protonix EC tablet 40 mg p.o. daily for GERD -- Inderal LA 24-hour capsule 120 mg p.o. daily for high blood pressure  --Start clonidine tablet 0.1 mg, oral, every 6 hours as needed, SBP >160 and/or DBP >100.   Continue BH Agitation Protocol --Haldol 5 mg, oral, 3 times daily as needed, mild agitation --Benadryl 50 mg, oral, 3 times daily as needed, mild agitation  OR  --Haldol injection 5 mg, IM, 3 times daily as needed, moderate agitation --Benadryl injection 50 mg, IM, 3 times daily as needed, moderate agitation --Ativan injection 2 mg, IM, 3 times daily as needed, moderate agitation                                     OR --Haldol injection 10 mg, IM, 3 times daily as needed, severe agitation --Benadryl injection 50 mg, IM, 3 times daily as needed, severe agitation --Ativan injection 2 mg, IM, 3 times daily as needed, severe agitation    Other PRN Medications -Acetaminophen 650 mg every 6 as needed/mild pain -Maalox 30 mL oral every 4 as needed/digestion -Magnesium hydroxide 30 mL daily as needed/mild constipation   --The risks/benefits/side-effects/alternatives to this medication were discussed in detail with the patient and time was given for questions. The patient consents to medication trial.  -- Metabolic profile and EKG monitoring obtained while on an atypical antipsychotic (BMI: Lipid Panel: HbgA1c: QTc:)  -- Encouraged patient to participate in unit milieu and in scheduled group therapies    Admission reviewed: CMP: No metabolic dysregulation.  Lipid panel: LDL 105 elevated.  CBC with differential: RBC 5.34 elevated, MCV 74.3 low, MCH 23.4 low, RDW 15.9 elevated.  Otherwise normal.  Hemoglobin A1c: 5.7 slight elevation.  Pregnancy test: Negative.  TSH: 1.770 within normal limits.  UDS: Positive for benzos and marijuana.   New labs ordered: Vitamin D 25-hydroxy   EKG reviewed:  No EKG  obtained   Safety and Monitoring: Voluntary admission to inpatient psychiatric unit for safety, stabilization and treatment Daily contact with patient to assess and evaluate symptoms and progress in treatment Patient's case to be discussed in multi-disciplinary team meeting Observation Level : q15 minute checks Vital signs: q12 hours Precautions: suicide, but pt currently verbally contracts for safety on unit    Discharge Planning: Social work and case management to assist with discharge planning and identification of hospital follow-up needs prior to discharge Estimated LOS: 5-7 days Discharge Concerns: Need to establish a safety plan; Medication compliance and effectiveness Discharge Goals: Return home with outpatient referrals for mental health follow-up including medication management/psychotherapy.   Long Term Goal(s): Improvement in symptoms so as ready for discharge   Short Term Goals: Ability to identify changes in lifestyle to reduce recurrence of condition will improve, Ability to verbalize feelings will improve, Ability to disclose and discuss suicidal ideas, Ability to demonstrate self-control will improve, Ability to identify and develop effective coping behaviors will improve, Ability to maintain clinical measurements within normal limits will improve, Compliance with prescribed medications will improve, and Ability to identify triggers associated with substance abuse/mental health issues will improve       Norma Fredrickson, NP 08/20/2023, 9:58 AM Patient ID: [name], female   DOB: 02-10-1986, 38 y.o.   MRN: [id number]

## 2023-08-20 NOTE — Progress Notes (Signed)
 Pt visible in the milieu. Interacting appropriately with staff and peers.  Attending groups.  Pt stated she was a little disappointed that she was unable to go home today because her daughter came home but states otherwise everything is going well.  Needs assessed. Pt denied.  Pt denied SI, HI and AVH.  Safety checks continue for patient safety.

## 2023-08-21 DIAGNOSIS — F339 Major depressive disorder, recurrent, unspecified: Secondary | ICD-10-CM

## 2023-08-21 MED ORDER — PANTOPRAZOLE SODIUM 40 MG PO TBEC: 40.0000 mg | DELAYED_RELEASE_TABLET | Freq: Every day | ORAL | 0 refills | Status: AC

## 2023-08-21 MED ORDER — PROPRANOLOL HCL ER 120 MG PO CP24: 120.0000 mg | ORAL_CAPSULE | Freq: Every day | ORAL | 0 refills | Status: AC

## 2023-08-21 MED ORDER — CLONIDINE HCL 0.1 MG PO TABS: 0.1000 mg | ORAL_TABLET | Freq: Four times a day (QID) | ORAL | 11 refills | Status: DC | PRN

## 2023-08-21 MED ORDER — HYDROXYZINE HCL 50 MG PO TABS: 50.0000 mg | ORAL_TABLET | Freq: Three times a day (TID) | ORAL | 0 refills | Status: DC | PRN

## 2023-08-21 MED ORDER — MELATONIN 3 MG PO TABS: 3.0000 mg | ORAL_TABLET | Freq: Every evening | ORAL | 0 refills | Status: DC | PRN

## 2023-08-21 MED ORDER — ARIPIPRAZOLE 5 MG PO TABS: 5.0000 mg | ORAL_TABLET | Freq: Every day | ORAL | 0 refills | Status: DC

## 2023-08-21 MED ORDER — MIRTAZAPINE 30 MG PO TABS: 30.0000 mg | ORAL_TABLET | Freq: Every day | ORAL | 0 refills | Status: AC

## 2023-08-21 NOTE — [facility] Suicide Risk Assessment (Signed)
 [facility] INPATIENT:  Family/Significant Other Suicide Prevention Education  Suicide Prevention Education:  Education Completed;Melida Northington (Husband) [phone] has been identified by the patient as the family member/significant other with whom the patient will be residing, and identified as the person(s) who will aid the patient in the event of a mental health crisis (suicidal ideations/suicide attempt).  With written consent from the patient, the family member/significant other has been provided the following suicide prevention education, prior to the and/or following the discharge of the patient.  The suicide prevention education provided includes the following: Suicide risk factors Suicide prevention and interventions National Suicide Hotline telephone number [facility] assessment telephone number [facility] Emergency Assistance [address] [facility] and/or Residential Mobile Crisis Unit telephone number  Request made of family/significant other to: Remove weapons (e.g., guns, rifles, knives), all items previously/currently identified as safety concern.   Remove drugs/medications (over-the-counter, prescriptions, illicit drugs), all items previously/currently identified as a safety concern.  The family member/significant other verbalizes understanding of the suicide prevention education information provided.  The family member/significant other agrees to remove the items of safety concern listed above.  Firearms are present in the home. CSW advised for them to be locked up and bullets be stored separately.   Mikhail Hallenbeck L Creta Dorame LCSWA 08/21/2023, 10:47 AM

## 2023-08-21 NOTE — Plan of Care (Signed)
  Problem: Education: Goal: Mental status will improve Outcome: Progressing   Problem: Education: Goal: Emotional status will improve Outcome: Progressing

## 2023-08-21 NOTE — Progress Notes (Addendum)
 [name] presented smiling at the med window this morning, reported that she was very exciting about discharge- was looking forward to seeing her daughter who is home from college. Pt has been visible in the milieu, observed interacting well with peers and staff and attending groups.  Pt currently denies SI/HI and AVH A. Labs and vitals monitored. Pt given and educated on medications. Pt supported emotionally and encouraged to express concerns and ask questions.   R. Pt remains safe with 15 minute checks. Will continue POC.    08/21/23 0800  Psych Admission Type (Psych Patients Only)  Admission Status Voluntary  Psychosocial Assessment  Patient Complaints None  Eye Contact Fair  Facial Expression Animated  Affect Appropriate to circumstance  Speech Logical/coherent  Interaction Assertive  Motor Activity Other (Comment) (steady gait)  Appearance/Hygiene Unremarkable  Behavior Characteristics Cooperative;Appropriate to situation  Mood Pleasant  Thought Process  Coherency WDL  Content WDL  Delusions None reported or observed  Perception WDL  Hallucination None reported or observed  Judgment WDL  Confusion None  Danger to Self  Current suicidal ideation? Denies  Self-Injurious Behavior No self-injurious ideation or behavior indicators observed or expressed   Danger to Others  Danger to Others None reported or observed

## 2023-08-21 NOTE — Progress Notes (Signed)
   08/21/23 0530  15 Minute Checks  Location Bedroom  Visual Appearance Calm  Behavior Composed  Sleep (Behavioral Health Patients Only)  Calculate sleep? (Click Yes once per 24 hr at 0600 safety check) Yes  Documented sleep last 24 hours 8.25

## 2023-08-21 NOTE — Progress Notes (Signed)
 [facility] Child/Adolescent Case Management Discharge Plan :  Will you be returning to the same living situation after discharge: Yes,  with her Husband and children. At discharge, do you have transportation home?:Yes,  Husband and eldest daugther Do you have the ability to pay for your medications:Yes,  insurance  Release of information consent forms completed and in the chart;  Patient's signature needed at discharge.  Patient to Follow up at:  Follow-up Information     [facility]. Call on 08/24/2023.   Why: Please call your provider to schedule an appointment for therapy services as we were unable to contact prior to your discharge. Contact information: [address] [place], [place] [address]  P:[phone]        Milagros Evener, MD. Nyra Capes on 09/09/2023.   Specialty: Psychiatry Why: You have an appointment for medication management services on 09/09/23 at 2:00 pm.  The appointment will be held in person, but you may call to switch to Virtual. Contact information: [address] [place] [address] [phone]                 Family Contact:  Telephone:  Spoke with:  Husband  Patient denies SI/HI:   Yes,  per provider note.     Safety Planning and Suicide Prevention discussed:  Yes,  with the patients husband.   Kayn Haymore L Brihana Quickel LCSWA  08/21/2023, 10:48 AM

## 2023-08-21 NOTE — Progress Notes (Signed)
 Pt discharged to lobby. Pt was stable and appreciative at that time. All papers and electronic prescriptions were given and valuables returned. Suicide safety plan completed and copy provided for pt. Verbal understanding expressed. Denies SI/HI and A/VH. Pt given opportunity to express concerns and ask questions.

## 2023-08-21 NOTE — [facility] Group Notes (Signed)
 Pt participated in group activity

## 2023-08-21 NOTE — [facility] Suicide Risk Assessment (Signed)
 Suicide Risk Assessment  Discharge Assessment    [facility] Discharge Suicide Risk Assessment   Principal Problem: MDD (major depressive disorder), recurrent episode (HCC) Discharge Diagnoses: Principal Problem:   MDD (major depressive disorder), recurrent episode (HCC)  Reason for admission:   [name] is a 38 year-old AA female with prior psychiatric diagnoses significant for MDD recurrent severe, adjustment disorder with anxiety and depression, and suicidal ideation.  Patient presents voluntarily to [facility] from [facility] for worsening depression with suicidal ideation with plans to crash her car by herself or run into other people, or overdose with medications in the context of altercation with her husband.   Total Time spent with patient: 45 minutes  Musculoskeletal: Strength & Muscle Tone: within normal limits Gait & Station: normal Patient leans: N/A  Psychiatric Specialty Exam  Presentation  General Appearance:  Appropriate for Environment; Casual; Fairly Groomed  Eye Contact: Good  Speech: Clear and Coherent; Normal Rate  Speech Volume: Normal  Handedness: Right  Mood and Affect  Mood: Euthymic  Duration of Depression Symptoms: Greater than two weeks  Affect: Appropriate  Thought Process  Thought Processes: Coherent  Descriptions of Associations:Intact  Orientation:Full (Time, Place and Person)  Thought Content:Logical  History of Schizophrenia/Schizoaffective disorder:No  Duration of Psychotic Symptoms:No data recorded Hallucinations:Hallucinations: None  Ideas of Reference:None  Suicidal Thoughts:Suicidal Thoughts: No SI Active Intent and/or Plan: -- (Denies)  Homicidal Thoughts:Homicidal Thoughts: No  Sensorium  Memory: Immediate Good; Recent Good  Judgment: Good  Insight: Good  Executive Functions  Concentration: Good  Attention Span: Good  Recall: Good  Fund of  Knowledge: Good  Language: Good  Psychomotor Activity  Psychomotor Activity: Psychomotor Activity: Normal  Assets  Assets: Communication Skills; Desire for Improvement; Housing; Physical Health; Resilience; Social Support  Sleep  Sleep: Sleep: Good Number of Hours of Sleep: 8.25  Physical Exam: Physical Exam Vitals and nursing note reviewed.  Constitutional:      General: She is not in acute distress.    Appearance: She is not ill-appearing or toxic-appearing.  HENT:     Head: Normocephalic and atraumatic.     Nose: Nose normal.     Mouth/Throat:     Mouth: Mucous membranes are moist.     Pharynx: Oropharynx is clear.  Eyes:     Extraocular Movements: Extraocular movements intact.  Cardiovascular:     Rate and Rhythm: Normal rate.     Pulses: Normal pulses.  Pulmonary:     Effort: Pulmonary effort is normal.  Abdominal:     Comments: Deferred  Genitourinary:    Comments: Deferred Musculoskeletal:        General: Normal range of motion.     Cervical back: Normal range of motion.  Skin:    General: Skin is warm.  Neurological:     General: No focal deficit present.     Mental Status: She is alert and oriented to person, place, and time.     Gait: Gait normal.  Psychiatric:        Mood and Affect: Mood normal.        Behavior: Behavior normal.        Thought Content: Thought content normal.    Review of Systems  Constitutional:  Negative for chills and fever.  HENT:  Negative for sore throat.   Eyes:  Negative for blurred vision.  Respiratory:  Negative for cough, sputum production, shortness of breath and wheezing.   Cardiovascular:  Negative for chest pain and  palpitations.  Gastrointestinal:  Negative for abdominal pain, constipation, diarrhea, heartburn, nausea and vomiting.  Genitourinary:  Negative for dysuria, frequency and urgency.  Musculoskeletal:  Negative for myalgias.  Skin:  Negative for itching and rash.  Neurological:  Negative for  dizziness and headaches.  Endo/Heme/Allergies:        See allergy listing  Psychiatric/Behavioral:  Positive for depression. Negative for hallucinations, memory loss, substance abuse and suicidal ideas. The patient is nervous/anxious. The patient does not have insomnia.    Blood pressure (!) 138/97, pulse 94, temperature 98.3 F (36.8 C), temperature source Oral, resp. rate 18, height 5\' 5"  (1.651 m), weight 87.9 kg, SpO2 100%. Body mass index is 32.25 kg/m.  Mental Status Per Nursing Assessment::   On Admission:  Suicidal ideation indicated by patient  Demographic Factors:  NA  Loss Factors: Financial problems/change in socioeconomic status  Historical Factors: Family history of mental illness or substance abuse and Impulsivity  Risk Reduction Factors:   Positive social support, Positive therapeutic relationship, and Positive coping skills or problem solving skills  Continued Clinical Symptoms:  Depression:   Recent sense of peace/wellbeing More than one psychiatric diagnosis Previous Psychiatric Diagnoses and Treatments Medical Diagnoses and Treatments/Surgeries  Cognitive Features That Contribute To Risk:  Polarized thinking    Suicide Risk:  Mild: There are no identifiable plans, no associated intent, mild dysphoria and related symptoms, good self-control (both objective and subjective assessment), few other risk factors, and identifiable protective factors, including available and accessible social support.   Follow-up Information     [facility]. Call on 08/24/2023.   Why: Please call your provider to schedule an appointment for therapy services as we were unable to contact prior to your discharge. Contact information: [address] [place], [place] [address]  P:[phone]        Milagros Evener, MD. Nyra Capes on 09/09/2023.   Specialty: Psychiatry Why: You have an appointment for medication management services on 09/09/23 at 2:00 pm.  The appointment  will be held in person, but you may call to switch to Virtual. Contact information: [address] [place] [address] [phone]                Plan Of Care/Follow-up recommendations:  Discharge Recommendations:  The patient is being discharged with her family. Patient is to take her discharge medications as ordered.  See follow up above. We recommend that she participates in individual therapy to target uncontrollable agitation and substance abuse.  We recommend that she participates in therapy to target the conflict with her family, to improve communication skills and conflict resolution skills.  patient is to initiate/implement a contingency based behavioral model to address patient's behavior. We recommend that she gets AIMS scale, height, weight, blood pressure, fasting lipid panel, fasting blood sugar in three months from discharge if she's on atypical antipsychotics.  Patient will benefit from monitoring of recurrent suicidal ideation since patient is on antidepressant medication. The patient should abstain from all illicit substances and alcohol. If the patient's symptoms worsen or do not continue to improve or if the patient becomes actively suicidal or homicidal then it is recommended that the patient return to the closest hospital emergency room or call 911 for further evaluation and treatment. National Suicide Prevention Lifeline 1800-SUICIDE or 646-668-1907. Please follow up with your primary medical doctor for all other medical needs.  The patient has been educated on the possible side effects to medications and she/her guardian is to contact a medical  professional and inform outpatient provider of any new side effects of medication. She is to take regular diet and activity as tolerated.  Will benefit from moderate daily exercise. Patient was educated about removing/locking any firearms, medications or dangerous products from the home.  Activity:  As  tolerated Diet:  Regular Diet   Cecilie Lowers, FNP 08/21/2023, 9:57 AM

## 2023-08-21 NOTE — Discharge Summary (Signed)
 Physician Discharge Summary Note  Patient:  [name] is an 38 y.o., female MRN:  [id number] DOB:  [birth year] Patient phone:  [phone] (home)  Patient address:   [address] [place] [facility] [address],   Total Time spent with patient: 45 minutes  Date of Admission:  08/17/2023 Date of Discharge:   08/21/2023  Reason for Admission:  [name] is a 38 year-old AA female with prior psychiatric diagnoses significant for MDD recurrent severe, adjustment disorder with anxiety and depression, and suicidal ideation.  Patient presents voluntarily to [facility] from [facility] for worsening depression with suicidal ideation with plans to crash her car by herself or run into other people, or overdose with medications in the context of altercation with her husband.   Principal Problem: MDD (major depressive disorder), recurrent episode (HCC) Discharge Diagnoses: Principal Problem:   MDD (major depressive disorder), recurrent episode (HCC)  Past Psychiatric History: Previous Psych Diagnoses: Major depressive disorder recurrent, adjustment disorder with anxiety and depression Prior inpatient treatment: Denies Current/prior outpatient treatment: Patient is followed by [facility] Prior rehab hx: Denies Psychotherapy hx: Yes History of suicide: Denies History of homicide or aggression: Denies, apart from current incidents Psychiatric medication history: Psychiatric medication compliance history: Yes Neuromodulation history: Denies neuromodulation history Current Psychiatrist: He is currently seeing Dr. Evelene Croon Current therapist: He is currently being seen at [facility] counseling    Past Medical History:  Past Medical History:  Diagnosis Date   Abnormal Pap smear 08/14/2003   colpo   Anemia    History -FeSO4 SUPP IN PAST   Bronchospasm    Carpal tunnel syndrome of right wrist    Diastasis recti 12/12/2008   GBS carrier    First pregnancy    GERD    zantac   H/O chest pain 06/17/11, 06/2013   Resolved 06/2013   H/O varicella    Headache(784.0)    migraines;D/T BP   Herpes    cold sores   History of CT scan of chest    a. Cardiac CTA 10/16: no CAD   History of echocardiogram    a. Echo 10/16: EF 50%, diff HK, mild TR, mild PI   History of kidney stones    passed stone - no surgery required   History of stress test    a. ETT-Echo 10/16: normal ECGs, + LAD area ischemia >> will arrange Cardiac CTA   HTN (hypertension)    Hyperlipidemia    Hypertension    Low BMI    LV dysfunction 12/2012, 06/2013   Hx: EF 45-50%, Resolved by Jan 2015 echo- EF 50-55%   Palpitations    Hx only - no problems   Panic attack    HAS BEEN RX'D XANAX   Pelvic pain    With pregnancy   Postpartum hypertension    Preeclampsia 2010,2011   POST PARTUM;WAS PUT ON BP MEDS   Preterm contractions 06/15/2005   GIVEN BETAMETHASONE AND PROCARDIA TO STOP CTXS   Right ovarian cyst 06/15/2001   RT OOPHRECTOMY   Spotting in first trimester    With SAB   Stress    work and at home - no meds   SVD (spontaneous vaginal delivery)    x 5    Past Surgical History:  Procedure Laterality Date   breast cyst removal  06/16/2003   right   DILATION AND CURETTAGE OF UTERUS  06/16/2007   DILITATION & CURRETTAGE/HYSTROSCOPY WITH THERMACHOICE ABLATION N/A 10/11/2013   Procedure: DILATATION &  CURETTAGE/HYSTEROSCOPY WITH THERMACHOICE ABLATION;  Surgeon: Michael Litter, MD;  Location: [facility];  Service: Gynecology;  Laterality: N/A;   LAPAROSCOPIC OVARIAN CYSTECTOMY Left 04/25/2021   Procedure: LAPAROSCOPIC OVARIAN CYSTECTOMY and salpingectomy-oophorectomy;  Surgeon: Schermerhorn, Ihor Austin, MD;  Location: [facility] ORS;  Service: Gynecology;  Laterality: Left;   LAPAROSCOPY N/A 10/11/2013   Procedure: LAPAROSCOPY OPERATIVE WITH REMOVAL OF HYDROSALPINX ;  Surgeon: Michael Litter, MD;  Location: [facility];  Service: Gynecology;  Laterality: N/A;   ovary removed Right 2003    right - laparotomy   TUBAL LIGATION  07/03/2012   Procedure: POST PARTUM TUBAL LIGATION;  Surgeon: Purcell Nails, MD;  Location: [facility];  Service: Gynecology;  Laterality: Bilateral;  Bilateral post partum tubal ligation   WISDOM TOOTH EXTRACTION     Family History:  Family History  Problem Relation Age of Onset   Heart disease Mother        CHF   Hypertension Mother    Asthma Mother        CHILDHOOD   Diabetes Mother    Kidney disease Mother        dialysis   Depression Mother    Alcohol abuse Mother    Drug abuse Mother    Heart failure Mother    Hypertension Father    Alcohol abuse Father    Drug abuse Father    Asthma Sister    COPD Sister        chronic bronchitis   Depression Sister        ATTEMPTED SUICIDE   Arrhythmia Sister        needs ablation    Hypertension Maternal Grandmother    Diabetes Maternal Grandmother    Family Psychiatric  History: Medical: Patient unsure Psych: History of depression deceased mom Psych Rx: Patient unsure SA/HA: Patient unsure Substance use family hx: Deceased biological mom and biological dad were alcoholics   Social History:  Social History   Substance and Sexual Activity  Alcohol Use Not Currently     Social History   Substance and Sexual Activity  Drug Use Yes   Types: Marijuana   Comment: Episodic use    Social History   Socioeconomic History   Marital status: Married    Spouse name: Taurus   Number of children: 5   Years of education: 15   Highest education level: Not on file  Occupational History   Occupation: STUDENT   Occupation: Lexicographer: [employer]  Tobacco Use   Smoking status: Every Day    Current packs/day: 0.15    Types: Cigarettes   Smokeless tobacco: Never  Vaping Use   Vaping status: Never Used  Substance and Sexual Activity   Alcohol use: Not Currently   Drug use: Yes    Types: Marijuana    Comment: Episodic use   Sexual activity: Yes    Partners: Male     Birth control/protection: Surgical    Comment: tubal  Other Topics Concern   Not on file  Social History Narrative   Pt lives in [place] with husband and children.  She is followed by Dr. Evelene Croon   Social Drivers of Health   Financial Resource Strain: High Risk (08/02/2023)   Received from [facility]   Overall Financial Resource Strain (CARDIA)    Difficulty of Paying Living Expenses: Hard  Food Insecurity: No Food Insecurity (08/18/2023)   Hunger Vital Sign    Worried About Running Out of Food in the  Last Year: Never true    Ran Out of Food in the Last Year: Never true  Transportation Needs: No Transportation Needs (08/18/2023)   PRAPARE - Administrator, Civil Service (Medical): No    Lack of Transportation (Non-Medical): No  Physical Activity: Insufficiently Active (08/02/2023)   Received from [facility]   Exercise Vital Sign    Days of Exercise per Week: 5 days    Minutes of Exercise per Session: 20 min  Stress: Stress Concern Present (08/02/2023)   Received from [facility] Occupational [facility] - Occupational Stress Questionnaire    Feeling of Stress : Very much  Social Connections: Moderately Integrated (08/02/2023)   Received from [facility]   Social Network    How would you rate your social network (family, work, friends)?: Adequate participation with social networks   Hospital Course:  During the patient's hospitalization, patient had extensive initial psychiatric evaluation, and follow-up psychiatric evaluations every day.  Psychiatric diagnoses provided upon initial assessment: Diagnosis:  Principal Problem:   MDD (major depressive disorder), recurrent episode (HCC)  Patient's psychiatric medications were adjusted on admission:  --Discontinue nortriptyline due to side effect of compulsion and hallucination -- Initiate Abilify tablet 2 mg p.o. to augment antidepressant -- Continue Remeron tablets 30 mg p.o. q. nightly for  depression and sleep -- Continue hydroxyzine tablets 25 mg p.o. 3 times daily as needed for anxiety.   During the hospitalization, other adjustments were made to the patient's psychiatric medication regimen:  Increase Abilify to 5 mg p.o. daily to augment antidepressant  Patient's care was discussed during the interdisciplinary team meeting every day during the hospitalization.  The patient denies having side effects to prescribed psychiatric medication.  Gradually, patient started adjusting to milieu. The patient was evaluated each day by a clinical provider to ascertain response to treatment. Improvement was noted by the patient's report of decreasing symptoms, improved sleep and appetite, affect, medication tolerance, behavior, and participation in unit programming.  Patient was asked each day to complete a self inventory noting mood, mental status, pain, new symptoms, anxiety and concerns.    Symptoms were reported as significantly decreased or resolved completely by discharge.   On day of discharge, the patient reports that their mood is stable. The patient denied having suicidal thoughts for more than 48 hours prior to discharge.  Patient denies having homicidal thoughts.  Patient denies having auditory hallucinations.  Patient denies any visual hallucinations or other symptoms of psychosis. The patient was motivated to continue taking medication with a goal of continued improvement in mental health.   The patient reports their target psychiatric symptoms of depression responded well to the psychiatric medications, and the patient reports overall benefit other psychiatric hospitalization. Supportive psychotherapy was provided to the patient. The patient also participated in regular group therapy while hospitalized. Coping skills, problem solving as well as relaxation therapies were also part of the unit programming.  Labs were reviewed with the patient, and abnormal results were discussed  with the patient.  The patient is able to verbalize their individual safety plan to this provider.  # It is recommended to the patient to continue psychiatric medications as prescribed, after discharge from the hospital.    # It is recommended to the patient to follow up with your outpatient psychiatric provider and PCP.  # It was discussed with the patient, the impact of alcohol, drugs, tobacco have been there overall psychiatric and medical wellbeing, and total abstinence from  substance use was recommended the patient.ed.  # Prescriptions provided or sent directly to preferred pharmacy at discharge. Patient agreeable to plan. Given opportunity to ask questions. Appears to feel comfortable with discharge.    # In the event of worsening symptoms, the patient is instructed to call the crisis hotline, 911 and or go to the nearest ED for appropriate evaluation and treatment of symptoms. To follow-up with primary care provider for other medical issues, concerns and or health care needs  # Patient was discharged to home with a plan to follow up as noted below.   Addendum: Patient denies allergic reaction to propranolol.  Physical Findings: AIMS:  , ,  ,  ,    CIWA:    COWS:     Musculoskeletal: Strength & Muscle Tone: within normal limits Gait & Station: normal Patient leans: N/A  Psychiatric Specialty Exam:  Presentation  General Appearance:  Appropriate for Environment; Casual; Fairly Groomed  Eye Contact: Good  Speech: Clear and Coherent; Normal Rate  Speech Volume: Normal  Handedness: Right  Mood and Affect  Mood: Euthymic  Affect: Appropriate  Thought Process  Thought Processes: Coherent  Descriptions of Associations:Intact  Orientation:Full (Time, Place and Person)  Thought Content:Logical  History of Schizophrenia/Schizoaffective disorder:No  Duration of Psychotic Symptoms:No data recorded Hallucinations:Hallucinations: None  Ideas of  Reference:None  Suicidal Thoughts:Suicidal Thoughts: No SI Active Intent and/or Plan: -- (Denies)  Homicidal Thoughts:Homicidal Thoughts: No  Sensorium  Memory: Immediate Good; Recent Good  Judgment: Good  Insight: Good  Executive Functions  Concentration: Good  Attention Span: Good  Recall: Good  Fund of Knowledge: Good  Language: Good   Psychomotor Activity  Psychomotor Activity: Psychomotor Activity: Normal  Assets  Assets: Communication Skills; Desire for Improvement; Housing; Physical Health; Resilience; Social Support  Sleep  Sleep: Sleep: Good Number of Hours of Sleep: 8.25  Physical Exam: Physical Exam Vitals and nursing note reviewed.  Constitutional:      General: She is not in acute distress.    Appearance: She is not ill-appearing or toxic-appearing.  HENT:     Head: Normocephalic.     Nose: Nose normal.     Mouth/Throat:     Pharynx: Oropharynx is clear.  Eyes:     Extraocular Movements: Extraocular movements intact.  Cardiovascular:     Rate and Rhythm: Normal rate.     Pulses: Normal pulses.  Pulmonary:     Effort: Pulmonary effort is normal.  Abdominal:     Comments: Deferred  Genitourinary:    Comments: Deferred Musculoskeletal:        General: Normal range of motion.     Cervical back: Normal range of motion.  Skin:    General: Skin is warm.  Neurological:     General: No focal deficit present.     Mental Status: She is alert and oriented to person, place, and time.  Psychiatric:        Mood and Affect: Mood normal.        Behavior: Behavior normal.        Thought Content: Thought content normal.        Judgment: Judgment normal.    Review of Systems  Constitutional:  Negative for chills and fever.  HENT:  Negative for sore throat.   Eyes:  Negative for blurred vision.  Respiratory:  Negative for cough, sputum production, shortness of breath and wheezing.   Cardiovascular:  Negative for chest pain and  palpitations.  Gastrointestinal:  Negative for abdominal pain,  constipation, diarrhea, heartburn, nausea and vomiting.  Genitourinary:  Negative for dysuria, frequency and urgency.  Musculoskeletal:  Negative for myalgias.  Skin:  Negative for itching and rash.  Neurological:  Negative for dizziness, tingling and headaches.  Endo/Heme/Allergies:        See allergy listing  Psychiatric/Behavioral:  Positive for depression (Stable with medication). Negative for hallucinations and suicidal ideas. The patient is nervous/anxious (Improved with medication). The patient does not have insomnia.    Blood pressure (!) 138/97, pulse 94, temperature 98.3 F (36.8 C), temperature source Oral, resp. rate 18, height 5\' 5"  (1.651 m), weight 87.9 kg, SpO2 100%. Body mass index is 32.25 kg/m.  Social History   Tobacco Use  Smoking Status Every Day   Current packs/day: 0.15   Types: Cigarettes  Smokeless Tobacco Never   Tobacco Cessation:  A prescription for an FDA-approved tobacco cessation medication was offered at discharge and the patient refused  Blood Alcohol level:  No results found for: "ETH"  Metabolic Disorder Labs:  Lab Results  Component Value Date   HGBA1C 5.7 (H) 08/17/2023   MPG 116.89 08/17/2023   No results found for: "PROLACTIN" Lab Results  Component Value Date   CHOL 189 08/17/2023   TRIG 130 08/17/2023   HDL 58 08/17/2023   CHOLHDL 3.3 08/17/2023   VLDL 26 08/17/2023   LDLCALC 105 (H) 08/17/2023    See Psychiatric Specialty Exam and Suicide Risk Assessment completed by Attending Physician prior to discharge.  Discharge destination:  Home  Is patient on multiple antipsychotic therapies at discharge:  No   Has Patient had three or more failed trials of antipsychotic monotherapy by history:  No  Recommended Plan for Multiple Antipsychotic Therapies: NA  Discharge Instructions     Increase activity slowly   Complete by: As directed       Allergies as of  08/21/2023       Reactions   Hydrochlorothiazide Other (See Comments)   Patient states that it was years ago when she had a reaction to HCTZ and she can not remember what it was   Labetalol Other (See Comments)   Alopecia   Lisinopril Cough        Medication List     STOP taking these medications    betamethasone dipropionate 0.05 % cream   diazepam 2 MG tablet Commonly known as: VALIUM   Dupixent 300 MG/2ML Sopn   gabapentin 300 MG capsule Commonly known as: NEURONTIN   nortriptyline 50 MG capsule Commonly known as: PAMELOR   ondansetron 4 MG tablet Commonly known as: ZOFRAN       TAKE these medications      Indication  amLODipine 10 MG tablet Commonly known as: NORVASC Take 10 mg by mouth daily.  Indication: High Blood Pressure   ARIPiprazole 5 MG tablet Commonly known as: ABILIFY Take 1 tablet (5 mg total) by mouth daily. Start taking on: August 22, 2023  Indication: Major Depressive Disorder   atorvastatin 40 MG tablet Commonly known as: LIPITOR Take 40 mg by mouth daily.  Indication: High Amount of Fats in the Blood   cloNIDine 0.1 MG tablet Commonly known as: CATAPRES Take 1 tablet (0.1 mg total) by mouth every 6 (six) hours as needed (syst bl pr>160 and/or diast bl pr>100).  Indication: High Blood Pressure   hydrOXYzine 50 MG tablet Commonly known as: ATARAX Take 1 tablet (50 mg total) by mouth 3 (three) times daily as needed for anxiety.  Indication: Feeling Anxious  letrozole 2.5 MG tablet Commonly known as: FEMARA Take 2.5 mg by mouth daily.  Indication: Cancer of the Breast   melatonin 3 MG Tabs tablet Take 1 tablet (3 mg total) by mouth at bedtime as needed.  Indication: Trouble Sleeping   mirtazapine 30 MG tablet Commonly known as: REMERON Take 1 tablet (30 mg total) by mouth at bedtime. What changed: when to take this  Indication: Major Depressive Disorder   pantoprazole 40 MG tablet Commonly known as: PROTONIX Take 1 tablet  (40 mg total) by mouth daily. Start taking on: August 22, 2023 What changed: how much to take  Indication: Gastroesophageal Reflux Disease   propranolol ER 120 MG 24 hr capsule Commonly known as: INDERAL LA Take 1 capsule (120 mg total) by mouth daily. Start taking on: August 22, 2023  Indication: High Blood Pressure   valsartan 160 MG tablet Commonly known as: DIOVAN Take 160 mg by mouth daily.  Indication: High Blood Pressure        Follow-up Information     [facility]. Call on 08/24/2023.   Why: Please call your provider to schedule an appointment for therapy services as we were unable to contact prior to your discharge. Contact information: [address] [place], [place] [address]  P:[phone]        Milagros Evener, MD. Nyra Capes on 09/09/2023.   Specialty: Psychiatry Why: You have an appointment for medication management services on 09/09/23 at 2:00 pm.  The appointment will be held in person, but you may call to switch to Virtual. Contact information: [address] [place] [address] [phone]                Follow-up recommendations:    Discharge Recommendations:  The patient is being discharged with her family. Patient is to take her discharge medications as ordered.  See follow up above. We recommend that she participates in individual therapy to target uncontrollable agitation and substance abuse.  We recommend that she participates in therapy to target the conflict with her family, to improve communication skills and conflict resolution skills.  patient is to initiate/implement a contingency based behavioral model to address patient's behavior. We recommend that she gets AIMS scale, height, weight, blood pressure, fasting lipid panel, fasting blood sugar in three months from discharge if she's on atypical antipsychotics.  Patient will benefit from monitoring of recurrent suicidal ideation since patient is on antidepressant  medication. The patient should abstain from all illicit substances and alcohol. If the patient's symptoms worsen or do not continue to improve or if the patient becomes actively suicidal or homicidal then it is recommended that the patient return to the closest hospital emergency room or call 911 for further evaluation and treatment. National Suicide Prevention Lifeline 1800-SUICIDE or (920)405-5200. Please follow up with your primary medical doctor for all other medical needs.  The patient has been educated on the possible side effects to medications and she/her guardian is to contact a medical professional and inform outpatient provider of any new side effects of medication. She is to take regular diet and activity as tolerated.  Will benefit from moderate daily exercise. Patient was educated about removing/locking any firearms, medications or dangerous products from the home.  Activity:  As tolerated Diet:  Regular Diet  Signed: Cecilie Lowers, FNP 08/21/2023, 10:27 AM

## 2023-08-21 NOTE — Group Note (Signed)
 Date:  08/21/2023 Time:  9:01 AM  Group Topic/Focus:  Goals Group:   The focus of this group is to help patients establish daily goals to achieve during treatment and discuss how the patient can incorporate goal setting into their daily lives to aide in recovery.    Participation Level:  Active  Participation Quality:  Appropriate  Affect:  Appropriate  Cognitive:  Appropriate  Insight: Appropriate  Engagement in Group:  Engaged  Modes of Intervention:  Discussion and Orientation  Additional Comments:  Discharge today  Azalee Course 08/21/2023, 9:01 AM

## 2023-08-23 ENCOUNTER — Telehealth: Payer: Self-pay | Admitting: Radiation Oncology

## 2023-08-23 NOTE — Telephone Encounter (Signed)
 LVM to r/s missed appt 3/6

## 2023-09-02 ENCOUNTER — Ambulatory Visit: Admitting: Radiation Oncology

## 2023-09-09 ENCOUNTER — Ambulatory Visit
Admission: RE | Admit: 2023-09-09 | Discharge: 2023-09-09 | Disposition: A | Discharge status: Home / Self Care | Source: Ambulatory Visit | Attending: Radiation Oncology | Admitting: Radiation Oncology

## 2023-09-09 DIAGNOSIS — Z170 Estrogen receptor positive status [ER+]: Secondary | ICD-10-CM

## 2023-09-09 NOTE — Progress Notes (Signed)
 [name] presents today for a one month telephone follow-up after completing radiation to her left breast on 07/19/2023.  Patient is very pleasant and says she is healing well and her skin in the treatment field is returning to normal.  She says she feels like her normal self again and is exercising everyday.   Pain: Patient denies. Skin: Skin is healing well and not having any peeling or itching. Patient is using vitamin E oil. ROM: None. Lymphedema: None. MedOnc F/U: April 1st. Other issues of note: None.  Pt reports Yes No Comments  Tamoxifen []  [x]    Letrozole [x]  []    Anastrazole []  [x]    Mammogram []  Date: N/A []       Wt Readings from Last 3 Encounters:  04/25/21 173 lb 1 oz (78.5 kg)  04/24/21 173 lb (78.5 kg)  02/23/19 165 lb (74.8 kg)

## 2023-11-29 ENCOUNTER — Ambulatory Visit: Admitting: Oncology

## 2023-12-06 ENCOUNTER — Telehealth: Payer: Self-pay | Admitting: *Deleted

## 2023-12-06 NOTE — Telephone Encounter (Signed)
 [name] called that she will not be able to make the new patient appointment tomorrow due to being at her daughter's college orientation. Appointment cancelled and nurse navigator notified.

## 2023-12-07 ENCOUNTER — Inpatient Hospital Stay

## 2023-12-07 ENCOUNTER — Inpatient Hospital Stay: Admitting: Oncology

## 2023-12-13 ENCOUNTER — Telehealth: Payer: Self-pay | Admitting: Pharmacy Technician

## 2023-12-13 ENCOUNTER — Other Ambulatory Visit (HOSPITAL_COMMUNITY): Payer: Self-pay

## 2023-12-13 ENCOUNTER — Inpatient Hospital Stay

## 2023-12-13 ENCOUNTER — Encounter: Payer: Self-pay | Admitting: Oncology

## 2023-12-13 ENCOUNTER — Inpatient Hospital Stay: Attending: Oncology | Admitting: Oncology

## 2023-12-13 ENCOUNTER — Telehealth: Payer: Self-pay

## 2023-12-13 VITALS — BP 125/95 | HR 82 | Temp 97.7°F | Resp 18 | Ht 65.0 in | Wt 177.3 lb

## 2023-12-13 DIAGNOSIS — T451X5A Adverse effect of antineoplastic and immunosuppressive drugs, initial encounter: Secondary | ICD-10-CM | POA: Diagnosis not present

## 2023-12-13 DIAGNOSIS — F339 Major depressive disorder, recurrent, unspecified: Secondary | ICD-10-CM | POA: Insufficient documentation

## 2023-12-13 DIAGNOSIS — Z9013 Acquired absence of bilateral breasts and nipples: Secondary | ICD-10-CM | POA: Diagnosis not present

## 2023-12-13 DIAGNOSIS — Z1721 Progesterone receptor positive status: Secondary | ICD-10-CM | POA: Diagnosis not present

## 2023-12-13 DIAGNOSIS — Z1732 Human epidermal growth factor receptor 2 negative status: Secondary | ICD-10-CM | POA: Insufficient documentation

## 2023-12-13 DIAGNOSIS — R232 Flushing: Secondary | ICD-10-CM | POA: Insufficient documentation

## 2023-12-13 DIAGNOSIS — N951 Menopausal and female climacteric states: Secondary | ICD-10-CM | POA: Insufficient documentation

## 2023-12-13 DIAGNOSIS — C50412 Malignant neoplasm of upper-outer quadrant of left female breast: Secondary | ICD-10-CM | POA: Diagnosis present

## 2023-12-13 DIAGNOSIS — Z170 Estrogen receptor positive status [ER+]: Secondary | ICD-10-CM | POA: Insufficient documentation

## 2023-12-13 DIAGNOSIS — G620 Drug-induced polyneuropathy: Secondary | ICD-10-CM | POA: Insufficient documentation

## 2023-12-13 DIAGNOSIS — Z79899 Other long term (current) drug therapy: Secondary | ICD-10-CM | POA: Diagnosis not present

## 2023-12-13 DIAGNOSIS — G939 Disorder of brain, unspecified: Secondary | ICD-10-CM

## 2023-12-13 DIAGNOSIS — Z79811 Long term (current) use of aromatase inhibitors: Secondary | ICD-10-CM | POA: Diagnosis not present

## 2023-12-13 DIAGNOSIS — F332 Major depressive disorder, recurrent severe without psychotic features: Secondary | ICD-10-CM

## 2023-12-13 DIAGNOSIS — F17210 Nicotine dependence, cigarettes, uncomplicated: Secondary | ICD-10-CM | POA: Diagnosis not present

## 2023-12-13 MED ORDER — ABEMACICLIB 150 MG PO TABS
150.0000 mg | ORAL_TABLET | Freq: Two times a day (BID) | ORAL | 3 refills | Status: DC
  Filled 2023-12-14: qty 56, 28d supply, fill #0

## 2023-12-13 MED ORDER — LETROZOLE 2.5 MG PO TABS
2.5000 mg | ORAL_TABLET | Freq: Every day | ORAL | 3 refills | Status: AC
Start: 2023-12-13 — End: 2024-12-12

## 2023-12-13 NOTE — Assessment & Plan Note (Signed)
 Persistent neuropathy with numbness in fingers and toes, pain and swelling in legs. Gabapentin  ineffective. Considering Lyrica; Cymbalta contraindicated due to current medications. Prefers to research Lyrica before starting. - Consider Lyrica for neuropathy if she decides to proceed.

## 2023-12-13 NOTE — Progress Notes (Signed)
 [facility]  ONCOLOGY CONSULT NOTE   PATIENT NAME: [name]   MR#: [id number] DOB: 1986-06-15  DATE OF SERVICE: 12/13/2023   REFERRING PROVIDER  Emilio Joesph VEAR, PA-C   Patient Care Team: Emilio Joesph VEAR DEVONNA as PCP - General (Physician Assistant)    CHIEF COMPLAINT/ PURPOSE OF CONSULTATION:   Continuation of care for stage II left breast cancer, ER positive, PR positive, HER2/neu negative, diagnosed in April 2024  ASSESSMENT & PLAN:   [name] is a 38 y.o. lady with a past medical history of anxiety/depression, panic attacks, hypertension, dyslipidemia, was diagnosed with stage II left breast cancer, ER positive, PR positive, HER2/neu negative, diagnosed in April 2024.  Previously she was seen at [facility] by Dr. Kelby.  Patient wanted to transfer her care to our facility and hence she was referred to our clinic.  Carcinoma of upper-outer quadrant of left breast in female, estrogen receptor positive (HCC) Please review oncology history for additional details and timeline of events.  She was originally diagnosed in April 2024.  ER positive, PR positive, HER2/neu negative by FISH.  10/28/2022- 03/16/2023 neoadjuvant chemotherapy with dose dense AC/weekly Taxol   04/14/2023 bilateral mastectomy with Dr. Kirt =ypT1bN1a: 2 separate primary 8 mm and 9 mm 1 out of 8 lymph node involved RCB II  06/01/2019 24-2 07/19/2023 adjuvant radiation therapy at [facility] with Dr Lauraine Golden: 50.4 Gy in 28 frx 08/03/2023 started adjuvant endocrine therapy with letrozole .   Previously Dr. Kelby proposed adding adjuvant Abemaciclib but patient was undecided at that time.  Reviewed clinical trial data of MonarchE study.  Given overall benefit in disease-free survival and recurrence risk, patient was agreeable to proceeding with abemaciclib.  Plan is to start at 150 mg p.o. twice daily dosing for planned duration of 2 years.  Discussed side effect profile including  leukopenia/neutropenia, diarrhea, fatigue, nausea Septra.  Patient verbalized understanding.  Prescription sent to specialty pharmacy.  CT of the head from March 2025 which showed 3 mm nonspecific lesion in the posterior pons.  MRI could not be obtained as patient is undergoing breast reconstruction with expanders.  Request submitted for repeat CT of the head with and without contrast for further evaluation.  This needs to be done at [place] [facility] per patient's insurance.  Plan to see her in 4 weeks for follow-up with repeat labs to assess compliance and tolerance with abemaciclib.  She was advised to continue letrozole  2.5 mg orally daily.  Chemotherapy-induced peripheral neuropathy (HCC) Persistent neuropathy with numbness in fingers and toes, pain and swelling in legs. Gabapentin  ineffective. Considering Lyrica; Cymbalta contraindicated due to current medications. Prefers to research Lyrica before starting. - Consider Lyrica for neuropathy if she decides to proceed.  Major depressive disorder, recurrent episode (HCC) Ongoing depression managed with Remeron  and nortriptyline . Diazepam  less effective. Significant emotional distress and anxiety due to personal and family stressors. Current medications limit additional pharmacological options. - Continue current regimen of Remeron  and nortriptyline . - Follow-up with psychiatrist Dr. Emilio Aurora on the 18th for further management of depression and anxiety.  Hot flashes Severe hot flashes possibly related to anxiety and hormone therapy. Current medications limit pharmacological options. Vitamin E suggested as a non-pharmacological option. - Recommend vitamin E 400 units daily for hot flashes.   I reviewed lab results and outside records for this visit and discussed relevant results with the patient. Diagnosis, plan of care and treatment options were also discussed in detail with the patient. Opportunity  provided to ask questions and answers  provided to her apparent satisfaction. Provided instructions to call our clinic with any problems, questions or concerns prior to return visit. I recommended to continue follow-up with PCP and sub-specialists. She verbalized understanding and agreed with the plan. No barriers to learning was detected.  NCCN guidelines have been consulted in the planning of this patient's care.  Chinita Patten, MD  12/13/2023 5:44 PM  [facility] - A DEPT OF [facility] [address] [place] [address] Dept: [phone] Dept Fax: [phone]   HISTORY OF PRESENTING ILLNESS:   I have reviewed her chart and materials related to her cancer extensively and collaborated history with the patient. 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  09/30/2022 diagnostic mammogram = 3.9 cm lesion upper outer quadrant lesion, a 1.5 cm upper outer quadrant lesion in the left breast + indeterminate axilla  10/02/2022 left breast biopsy and left axillary biopsy #1 mass/ 11 cm from nipple (3.9 cm) = invasive ductal carcinoma, grade 3, ER 99%, PR 97, HER2 2+ FISH negative #2 mass/ 8 cm from nipple (1.5cm)= invasive ductal carcinoma ER 99%, PR negative, HER2 2+ FISH negative Left axillary lymph node = invasive ductal carcinoma, grade 3 ER 99% PR 99% HER2 2+ FISH negative  10/28/2022- 03/16/2023 neoadjuvant chemotherapy with dose dense AC/weekly Taxol   04/14/2023 bilateral mastectomy with Dr. Kirt =ypT1bN1a: 2 separate primary 8 mm and 9 mm 1 out of 8 lymph node involved RCB II  06/01/2019 24-2 07/19/2023 adjuvant radiation therapy at [facility] with Dr Lauraine Golden: 50.4 Gy in 28 frx 08/03/2023 started adjuvant endocrine therapy with  letrozole   Dr. Kelby was planning to add adjuvant abemaciclib.   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 In addition, she was diagnosed before the age of 54. She meets criteria for genetic testing. She had telemedicine visit with the genetic counselor on 10/15/2022. She had a 71 Ambry gene panel. 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  A referral has been placed for her to see Dr. Arlee with gynecology oncology. The appointment was scheduled for February 04, 2023. The patient canceled and never rescheduled.  It should be noted that she is status post bilateral oophorectomy for teratomas. One ovary was removed at the age of 19 and another one was removed in 2022. It is not clear if the fallopian tubes were also removed during both surgeries.  Patient wanted to transfer care to our facility and hence she presented to establish with us  on 12/13/2023.  Plan made to continue letrozole .  Patient was agreeable to adding abemaciclib at 150 mg p.o. twice daily, with planned duration of 2 years.  Prescription sent.   Oncology History  Carcinoma of upper-outer quadrant of left breast in female, estrogen receptor positive (HCC)  05/04/2023 Cancer Staging   Staging form: Breast, AJCC 8th Edition - Pathologic stage from 05/04/2023: ypT1b, ypN1a(sn), cM0, G3, ER+, PR+, HER2- - Signed by Patten Chinita, MD on 12/13/2023 Stage prefix: Post-therapy Response to neoadjuvant therapy: Partial response Method of lymph node assessment: Sentinel lymph node biopsy Nuclear grade: G3 Multigene prognostic tests performed: Other Histologic grading system: 3 grade system Residual tumor (R): R0   05/11/2023  Initial Diagnosis   Malignant neoplasm of upper outer quadrant of female breast (HCC)     INTERVAL HISTORY:  Discussed the use of AI scribe software for clinical note transcription with the patient, who gave verbal consent to proceed.  History of Present Illness LAVONIA EAGER is a 38 year old female with breast cancer who presents for follow-up regarding chemotherapy-induced neuropathy and other treatment-related side effects. She was referred by Dr. Kelby for follow-up on her cancer treatment and related side effects.  She completed 28 fractions of radiation therapy in February and underwent chemotherapy from May to October of the previous year. She experiences significant neuropathy as a side effect of chemotherapy, with persistent numbness in her two smaller toes and the tips of two fingers. Severe leg pain and swelling are present, which gabapentin  has not alleviated. She has not tried other medications like Cymbalta or Lyrica.  She experiences severe hot flashes, which she associates with anxiety and panic attacks. These episodes can occur suddenly, impacting her ability to engage in activities with her children. She also experiences depression and anxiety, for which she is seeing Dr. Emilio Aurora. She is currently on Remeron , nortriptyline , and diazepam , though she notes that diazepam  is becoming less effective.  She has a history of bilateral mastectomies with reconstructive surgery performed on May 8th. She is under the care of Dr. Prentice Andrew for further reconstructive procedures. She mentions a lesion on her brain identified in a CT scan, with plans for follow-up imaging pending.  She is experiencing vaginal dryness, which she attributes to letrozole , a medication she is taking for her hormone receptor-positive breast cancer.  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 She is currently seeking new employment.     MEDICAL HISTORY:  Past Medical History:  Diagnosis Date   Abnormal Pap smear 08/14/2003   colpo   Anemia    History -FeSO4 SUPP IN PAST   Bronchospasm    Carpal tunnel syndrome of right wrist    Chest pain at rest  01/06/2013   Chest pain     Diastasis recti 12/12/2008   GBS carrier    First pregnancy   GERD    zantac   H/O chest pain 06/17/11, 06/2013   Resolved 06/2013   H/O varicella    Headache(784.0)    migraines;D/T BP   Herpes    cold sores   History of CT scan of chest    a. Cardiac CTA 10/16: no CAD   History of echocardiogram    a. Echo 10/16: EF 50%, diff HK, mild TR, mild PI   History of kidney stones    passed stone - no surgery required   History of stress test    a. ETT-Echo 10/16: normal ECGs, + LAD area ischemia >> will arrange Cardiac CTA   Hyperlipidemia    Hypertension    LV dysfunction 12/2012, 06/2013   Hx: EF 45-50%, Resolved by Jan 2015 echo- EF 50-55%   Panic attack    HAS BEEN RX'D XANAX   Pelvic pain    With pregnancy   Postpartum hypertension    Preeclampsia 2010,2011   POST PARTUM;WAS PUT ON BP MEDS   Preterm contractions 06/15/2005   GIVEN BETAMETHASONE AND PROCARDIA TO STOP CTXS   Right ovarian cyst 06/15/2001   RT OOPHRECTOMY   Spotting in first trimester    With  SAB   Stress    work and at home - no meds   SVD (spontaneous vaginal delivery)    x 5    SURGICAL HISTORY: Past Surgical History:  Procedure Laterality Date   breast cyst removal  06/16/2003   right   DILATION AND CURETTAGE OF UTERUS  06/16/2007   DILITATION & CURRETTAGE/HYSTROSCOPY WITH THERMACHOICE ABLATION N/A 10/11/2013   Procedure: DILATATION & CURETTAGE/HYSTEROSCOPY WITH THERMACHOICE ABLATION;  Surgeon: Ovid DELENA All, MD;  Location: [facility];  Service: Gynecology;  Laterality: N/A;   LAPAROSCOPIC OVARIAN CYSTECTOMY Left 04/25/2021   Procedure: LAPAROSCOPIC OVARIAN CYSTECTOMY and salpingectomy-oophorectomy;  Surgeon: Schermerhorn, Debby PARAS, MD;  Location: [facility];  Service: Gynecology;  Laterality: Left;   LAPAROSCOPY N/A 10/11/2013   Procedure: LAPAROSCOPY OPERATIVE WITH REMOVAL OF HYDROSALPINX ;  Surgeon: Ovid DELENA All, MD;  Location: [facility];  Service: Gynecology;  Laterality:  N/A;   ovary removed Right 2003   right - laparotomy   TUBAL LIGATION  07/03/2012   Procedure: POST PARTUM TUBAL LIGATION;  Surgeon: Jon CINDERELLA Rummer, MD;  Location: [facility];  Service: Gynecology;  Laterality: Bilateral;  Bilateral post partum tubal ligation   WISDOM TOOTH EXTRACTION      SOCIAL HISTORY: Social History   Socioeconomic History   Marital status: Married    Spouse name: Taurus   Number of children: 5   Years of education: 15   Highest education level: Not on file  Occupational History   Occupation: STUDENT   Occupation: Lexicographer: [employer]  Tobacco Use   Smoking status: Every Day    Current packs/day: 0.15    Types: Cigarettes   Smokeless tobacco: Never  Vaping Use   Vaping status: Never Used  Substance and Sexual Activity   Alcohol use: Not Currently   Drug use: Yes    Types: Marijuana    Comment: Episodic use   Sexual activity: Yes    Partners: Male    Birth control/protection: Surgical    Comment: tubal  Other Topics Concern   Not on file  Social History Narrative   Pt lives in [place] with husband and children.  She is followed by Dr. Vincente   Social Drivers of Health   Financial Resource Strain: Medium Risk (12/07/2023)   Received from [facility]   Overall Financial Resource Strain (CARDIA)    Difficulty of Paying Living Expenses: Somewhat hard  Food Insecurity: Food Insecurity Present (12/13/2023)   Hunger Vital Sign    Worried About Running Out of Food in the Last Year: Sometimes true    Ran Out of Food in the Last Year: Never true  Transportation Needs: Unmet Transportation Needs (12/13/2023)   PRAPARE - Administrator, Civil Service (Medical): Yes    Lack of Transportation (Non-Medical): No  Physical Activity: Sufficiently Active (12/07/2023)   Received from [facility]   Exercise Vital Sign    On average, how many days per week do you engage in moderate to strenuous exercise (like a brisk walk)?:  5 days    On average, how many minutes do you engage in exercise at this level?: 40 min  Stress: Stress Concern Present (12/07/2023)   Received from [facility] - Occupational Stress Questionnaire    Feeling of Stress : Very much  Social Connections: Moderately Integrated (12/07/2023)   Received from [facility]   Social Network    How would you rate your social  network (family, work, friends)?: Adequate participation with social networks  Intimate Partner Violence: Not At Risk (12/13/2023)   Humiliation, Afraid, Rape, and Kick questionnaire    Fear of Current or Ex-Partner: No    Emotionally Abused: No    Physically Abused: No    Sexually Abused: No    FAMILY HISTORY: Family History  Problem Relation Age of Onset   Heart disease Mother        CHF   Hypertension Mother    Asthma Mother        CHILDHOOD   Diabetes Mother    Kidney disease Mother        dialysis   Depression Mother    Alcohol abuse Mother    Drug abuse Mother    Heart failure Mother    Hypertension Father    Alcohol abuse Father    Drug abuse Father    Asthma Sister    COPD Sister        chronic bronchitis   Depression Sister        ATTEMPTED SUICIDE   Arrhythmia Sister        needs ablation    Hypertension Maternal Grandmother    Diabetes Maternal Grandmother     ALLERGIES:  She is allergic to hydrochlorothiazide, labetalol, and lisinopril .  MEDICATIONS:  Current Outpatient Medications  Medication Sig Dispense Refill   abemaciclib (VERZENIO) 150 MG tablet Take 1 tablet (150 mg total) by mouth 2 (two) times daily. 60 tablet 3   amLODipine  (NORVASC ) 10 MG tablet Take 10 mg by mouth daily.     atorvastatin  (LIPITOR) 40 MG tablet Take 40 mg by mouth daily.     diazepam  (VALIUM ) 5 MG tablet Take 5 mg by mouth every 8 (eight) hours as needed for anxiety.     DUPIXENT 300 MG/2ML SOAJ Inject 300 mg into the skin every 14 (fourteen) days. Per pt report, can  have every 2-4 weeks.     gabapentin  (NEURONTIN ) 300 MG capsule Take 600 mg by mouth at bedtime.     mirtazapine  (REMERON ) 30 MG tablet Take 1 tablet (30 mg total) by mouth at bedtime. (Patient taking differently: Take 30 mg by mouth at bedtime. Patient is taking 15 mg daily per Dr. Vincente.) 30 tablet 0   nortriptyline  (PAMELOR ) 50 MG capsule Take 150 mg by mouth at bedtime.     pantoprazole  (PROTONIX ) 40 MG tablet Take 1 tablet (40 mg total) by mouth daily. 30 tablet 0   propranolol  ER (INDERAL  LA) 120 MG 24 hr capsule Take 1 capsule (120 mg total) by mouth daily. 30 capsule 0   valsartan (DIOVAN) 160 MG tablet Take 160 mg by mouth daily.     Cholecalciferol 1.25 MG (50000 UT) capsule Take 1 capsule by mouth daily. Pt to pick up over-the-counter (Patient not taking: Reported on 12/13/2023)     letrozole  (FEMARA ) 2.5 MG tablet Take 1 tablet (2.5 mg total) by mouth daily. 90 tablet 3   No current facility-administered medications for this visit.    REVIEW OF SYSTEMS:    Review of Systems - Oncology  All other pertinent systems were reviewed with the patient and are negative.  PHYSICAL EXAMINATION:    Onc Performance Status - 12/13/23 1332       ECOG Perf Status   ECOG Perf Status Restricted in physically strenuous activity but ambulatory and able to carry out work of a light or sedentary nature, e.g., light house work, office work  KPS SCALE   KPS % SCORE Cares for self, unable to carry on normal activity or to do active work          Vitals:   12/13/23 1319 12/13/23 1320  BP: (!) 132/106 (!) 125/95  Pulse: 82   Resp: 18   Temp: 97.7 F (36.5 C)   SpO2: 100%    Filed Weights   12/13/23 1319  Weight: 177 lb 4.8 oz (80.4 kg)    Physical Exam Constitutional:      General: She is not in acute distress.    Appearance: Normal appearance.  HENT:     Head: Normocephalic and atraumatic.   Cardiovascular:     Rate and Rhythm: Normal rate.  Pulmonary:     Effort:  Pulmonary effort is normal. No respiratory distress.  Abdominal:     General: There is no distension.   Neurological:     General: No focal deficit present.     Mental Status: She is alert and oriented to person, place, and time.   Psychiatric:        Mood and Affect: Mood normal.        Behavior: Behavior normal.      LABORATORY DATA:   I have reviewed the data as listed.  I reviewed labs from her PCPs office on 12/08/2023.  CMP unremarkable.  CBC was unremarkable from April 2025  No results found for any visits on 12/13/23.  Lab Results  Component Value Date   WBC 6.4 08/17/2023   HGB 12.5 08/17/2023   HCT 39.7 08/17/2023   MCV 74.3 (L) 08/17/2023   PLT 246 08/17/2023   Recent Labs    08/17/23 0439  NA 140  K 3.9  CL 107  CO2 25  GLUCOSE 91  BUN 13  CREATININE 0.88  CALCIUM  9.7  GFRNONAA >60  PROT 6.6  ALBUMIN 4.0  AST 24  ALT 16  ALKPHOS 115  BILITOT 0.4     RADIOGRAPHIC STUDIES:  I reviewed radiology reports from outside facility including CT of the head from March 2025 which showed 3 mm nonspecific lesion in the posterior pons.  Orders Placed This Encounter  Procedures   CT HEAD W & WO CONTRAST ( )    Standing Status:   Future    Expiration Date:   12/12/2024    Scheduling Instructions:     Patient prefers to do this at [facility] for insurance reasons.    If indicated for the ordered procedure, I authorize the administration of contrast media per Radiology protocol:   Yes    Does the patient have a contrast media/X-ray dye allergy?:   No    Is patient pregnant?:   No    Preferred imaging location?:   External    CODE STATUS:  Code Status History     Date Active Date Inactive Code Status Order ID Comments User Context   08/17/2023 2340 08/21/2023 1726 Full Code [id number]  Randall Starlyn HERO, NP Inpatient   08/17/2023 2340 08/17/2023 2340 Full Code [id number]  Randall Starlyn HERO, NP Inpatient   08/17/2023 0418 08/17/2023 2249 Full Code  [id number]  Alan Thurman GAILS, NP ED   04/25/2021 1052 04/25/2021 2204 Full Code [id number]  Schermerhorn, Debby PARAS, MD Inpatient   10/18/2013 2030 10/19/2013 1915 Full Code [id number]  Verta Blossom, CNM Inpatient   07/02/2012 2101 07/04/2012 1534 Full Code [id number]  Shoptaw, Benedict Helling, RN Inpatient    Questions for Most Recent Historical Code Status (Order [id number])  Question Answer   By: Consent: discussion documented in EHR            I spent a total of 70 minutes during this encounter with the patient including review of chart and various tests results, discussions about plan of care and coordination of care plan.  This document was completed utilizing speech recognition software. Grammatical errors, random word insertions, pronoun errors, and incomplete sentences are an occasional consequence of this system due to software limitations, ambient noise, and hardware issues. Any formal questions or concerns about the content, text or information contained within the body of this dictation should be directly addressed to the provider for clarification.

## 2023-12-13 NOTE — Progress Notes (Signed)
 Currently, patient has her utilities on and running.

## 2023-12-13 NOTE — Assessment & Plan Note (Signed)
 Severe hot flashes possibly related to anxiety and hormone therapy. Current medications limit pharmacological options. Vitamin E suggested as a non-pharmacological option. - Recommend vitamin E 400 units daily for hot flashes.

## 2023-12-13 NOTE — Progress Notes (Signed)
 Bag of food offered but declined by patient

## 2023-12-13 NOTE — Assessment & Plan Note (Signed)
 Ongoing depression managed with Remeron  and nortriptyline . Diazepam  less effective. Significant emotional distress and anxiety due to personal and family stressors. Current medications limit additional pharmacological options. - Continue current regimen of Remeron  and nortriptyline . - Follow-up with psychiatrist Dr. Emilio Aurora on the 18th for further management of depression and anxiety.

## 2023-12-13 NOTE — Telephone Encounter (Signed)
 Oral Oncology Patient Advocate Encounter   Received notification that prior authorization for Verzenio is required.   PA submitted on 12/14/2023 Key [id number] Status is pending     Jaritza Duignan (Patty) Chet Burnet, CPhT  [facility], [place], [place], [place] Oral Chemotherapy Patient Advocate Phone: [phone]  Fax: [phone]

## 2023-12-13 NOTE — Assessment & Plan Note (Signed)
 Please review oncology history for additional details and timeline of events.  She was originally diagnosed in April 2024.  ER positive, PR positive, HER2/neu negative by FISH.  10/28/2022- 03/16/2023 neoadjuvant chemotherapy with dose dense AC/weekly Taxol   04/14/2023 bilateral mastectomy with Dr. Kirt =ypT1bN1a: 2 separate primary 8 mm and 9 mm 1 out of 8 lymph node involved RCB II  06/01/2019 24-2 07/19/2023 adjuvant radiation therapy at [facility] with Dr Lauraine Golden: 50.4 Gy in 28 frx 08/03/2023 started adjuvant endocrine therapy with letrozole .   Previously Dr. Kelby proposed adding adjuvant Abemaciclib but patient was undecided at that time.  Reviewed clinical trial data of MonarchE study.  Given overall benefit in disease-free survival and recurrence risk, patient was agreeable to proceeding with abemaciclib.  Plan is to start at 150 mg p.o. twice daily dosing for planned duration of 2 years.  Discussed side effect profile including leukopenia/neutropenia, diarrhea, fatigue, nausea Septra.  Patient verbalized understanding.  Prescription sent to specialty pharmacy.  CT of the head from March 2025 which showed 3 mm nonspecific lesion in the posterior pons.  MRI could not be obtained as patient is undergoing breast reconstruction with expanders.  Request submitted for repeat CT of the head with and without contrast for further evaluation.  This needs to be done at [facility] per patient's insurance.  Plan to see her in 4 weeks for follow-up with repeat labs to assess compliance and tolerance with abemaciclib.  She was advised to continue letrozole  2.5 mg orally daily.

## 2023-12-13 NOTE — Telephone Encounter (Signed)
 Oral Oncology Pharmacist Encounter  Received new prescription for Verzenio (abemaciclib) for the treatment of *** in conjunction with letrozole , planned duration ***.  Labs from *** assessed, ***.  Current medication list in Epic reviewed, no significant/ relevant DDIs with Verzenio identified.   Evaluated chart and no patient barriers to medication adherence noted.   Patient agreement for treatment documented in MD note on ***.  Prescription has been e-scribed to the [facility] for benefits analysis and approval.  Oral Oncology Clinic will continue to follow for insurance authorization, copayment issues, initial counseling and start date.  Jeremaine Maraj, PharmD Hematology/Oncology Clinical Pharmacist  Oral Chemotherapy Navigation Clinic [phone] 12/13/2023 2:56 PM

## 2023-12-14 ENCOUNTER — Other Ambulatory Visit: Payer: Self-pay | Admitting: Pharmacy Technician

## 2023-12-14 ENCOUNTER — Other Ambulatory Visit: Payer: Self-pay

## 2023-12-14 ENCOUNTER — Other Ambulatory Visit: Payer: Self-pay | Admitting: Oncology

## 2023-12-14 ENCOUNTER — Other Ambulatory Visit (HOSPITAL_COMMUNITY): Payer: Self-pay

## 2023-12-14 ENCOUNTER — Telehealth: Payer: Self-pay | Admitting: Pharmacy Technician

## 2023-12-14 MED ORDER — PROCHLORPERAZINE MALEATE 10 MG PO TABS
10.0000 mg | ORAL_TABLET | Freq: Four times a day (QID) | ORAL | 3 refills | Status: DC | PRN
  Filled 2023-12-14 (×2): qty 30, 8d supply, fill #0

## 2023-12-14 MED ORDER — ONDANSETRON HCL 8 MG PO TABS
8.0000 mg | ORAL_TABLET | Freq: Three times a day (TID) | ORAL | 3 refills | Status: DC | PRN
  Filled 2023-12-14 (×2): qty 30, 10d supply, fill #0

## 2023-12-14 NOTE — Progress Notes (Signed)
 Specialty Pharmacy Initial Fill Coordination Note  JUDITHANN VILLAMAR is a 38 y.o. female contacted today regarding refills of specialty medication(s) Abemaciclib (VERZENIO) .  Patient requested Delivery  on 12/16/23  to verified address [address] [place] [address]   Medication will be filled on 07/02.   Patient is aware of $4 copayment. Patient request - bill a/r. When filling, bill commercial insurance first and then medicaid secondary.   Patient also requested to have Compazine and Zofran  shipped out along with this med.  Freedom Lopezperez (Patty) Chet Burnet, CPhT  [facility], [place], [place], [place] Oral Chemotherapy Patient Advocate Phone: [phone]  Fax: [phone]

## 2023-12-14 NOTE — Telephone Encounter (Signed)
 Patient successfully OnBoarded and drug education provided by pharmacist. Medication scheduled to be shipped on 07/02 for delivery on 07/03 from [facility] to patient's address. Patient also knows to call me at [phone] with any questions or concerns regarding receiving medication or if there is any unexpected change in co-pay.   Inigo Lantigua (Patty) Chet Burnet, CPhT  [facility], [place], [place], [place] Oral Chemotherapy Patient Advocate Phone: [phone]  Fax: [phone]

## 2023-12-14 NOTE — Telephone Encounter (Signed)
 Oral Oncology Patient Advocate Encounter  Prior Authorization for Verzenio has been approved.    PA# [id number] Effective dates: 12/14/2023 through 12/13/2024  Patients co-pay is $4. Pharmacy to [employer] as primary and medicaid as secondary.    Shaquala Broeker (Patty) Chet Burnet, CPhT  [facility], [place], [place], [place] Oral Chemotherapy Patient Advocate Phone: [phone]  Fax: [phone]

## 2023-12-14 NOTE — Telephone Encounter (Signed)
 Oral Oncology Patient Advocate Encounter  After completing a benefits investigation, prior authorization for Verzenio is not required at this time through [facility].  Patient's copay is $4.  Pharmacy to [employer] as primary and medicaid as secondary.   Markeith Jue (Patty) Chet Burnet, CPhT  [facility], [place], [place], [place] Oral Chemotherapy Patient Advocate Phone: [phone]  Fax: [phone]

## 2023-12-14 NOTE — Telephone Encounter (Signed)
 Oral Chemotherapy Pharmacist Encounter  I spoke with patient for overview of: Verzenio for the adjuvant treatment of stage 2, hormone-receptor positive breast cancer, in combination with letrozole , planned duration for 2 years or until disease progression or unacceptable toxicity.   Counseled patient on administration, dosing, side effects, monitoring, drug-food interactions, safe handling, storage, and disposal.  Patient will take Verzenio 150mg  tablets, 1 tablet by mouth twice daily without regard to food.  Patient knows to avoid grapefruit and grapefruit juice. Patient is taking letrozole  once daily.   Verzenio start date: 12/20/2023  Adverse effects include but are not limited to: diarrhea, fatigue, nausea, abdominal pain, decreased blood counts, and increased liver function tests, and joint pains. Severe, life-threatening, and/or fatal interstitial lung disease (ILD) and/or pneumonitis may occur with CDK 4/6 inhibitors.  Patient is receiving anti-emetic medications with the shipment of verzenio and knows to take it if nausea develops.   Patient will obtain anti diarrheal and alert the office of 4 or more loose stools above baseline.  Reviewed with patient importance of keeping a medication schedule and plan for any missed doses. No barriers to medication adherence identified.  Medication reconciliation performed and medication/allergy list updated.  All questions answered. Patient voiced understanding and appreciation. Medication education handout placed in mail for patient. Patient knows to call the office with questions or concerns. Oral Chemotherapy Clinic phone number provided to patient.   Daundre Biel, PharmD Hematology/Oncology Clinical Pharmacist [facility] Oral Chemotherapy Navigation Clinic [phone] 12/14/2023   1:59 PM

## 2023-12-14 NOTE — Progress Notes (Signed)
 Patient counseled on Verzenio in telephone encounter opened on 12/13/23.  [name], PharmD Hematology/Oncology Clinical Pharmacist [facility] Oral Chemotherapy Navigation Clinic [phone]

## 2023-12-15 ENCOUNTER — Other Ambulatory Visit: Payer: Self-pay

## 2023-12-20 ENCOUNTER — Encounter: Payer: Self-pay | Admitting: Oncology

## 2023-12-22 ENCOUNTER — Other Ambulatory Visit: Payer: Self-pay | Admitting: Nurse Practitioner

## 2023-12-22 ENCOUNTER — Telehealth: Payer: Self-pay

## 2023-12-22 DIAGNOSIS — C50412 Malignant neoplasm of upper-outer quadrant of left female breast: Secondary | ICD-10-CM

## 2023-12-22 MED ORDER — DICYCLOMINE HCL 10 MG PO CAPS: 10.0000 mg | ORAL_CAPSULE | Freq: Two times a day (BID) | ORAL | 0 refills | Status: DC | PRN

## 2023-12-22 NOTE — Telephone Encounter (Addendum)
 Patient called very upset, States is a new patient of Dr. Autumn, just started Verzenio  150mg  on 12/20/23, due to being upset gave the phone to her daughter which mentioned At [phone] this morning patient started having new symptoms which include: excessive sweating, severe abdominal cramping that led to being on the floor unbearable to move around due to the severity of the pain, unable to have a bowel movement, just had taken Compazine  PO 10mg s 5-7 minutes ago.  Made aware would speak with Lacie NP and will give a call back on what the plan is moving forward. Understood.   1117- Called patient back, made aware provider reviewed symptoms/ patient message that was left on 12/20/23 about starting a fast/ mushroom coffee, wants patient to eliminate the mushroom coffee due to having an additive effect and could be enhancing side effects to Verzenio , also symptoms could be from fasting, not eating much food.  Provider would like to see patient in clinic today or tomorrow, otherwise can hold Verzenio , try to eat, then resume Verzenio  with food once feeling better, a  lower dose medication is also offered, Bentyl  will be sent to pharmacy for cramping. Patient agreed to come in tomorrow 1000 at [facility].

## 2023-12-22 NOTE — Progress Notes (Unsigned)
 [name]   [phone] [phone]    Patient Care Team: Emilio Joesph VEAR DEVONNA as PCP - General (Physician Assistant)   CHIEF COMPLAINT: Follow up symptom management, sweats, abdominal cramping after starting Verzenio   CURRENT THERAPY: Verzenio  150 mg BID, starting 12/20/23 plus AI  INTERVAL HISTORY [name] presents for symptom management visit. She had been doing liquid fast with mushroom coffee and fruit, then started verzenio  150 mg BID on 7/7. The morning 7/9 she developed painful cramps, sweats, and sensation to have a BM. She took compazine . She was instructed to hold mushroom coffee, try to eat, and hold verzenio  until seen.   ROS   Past Medical History:  Diagnosis Date   Abnormal Pap smear 08/14/2003   colpo   Anemia    History -FeSO4 SUPP IN PAST   Bronchospasm    Carpal tunnel syndrome of right wrist    Chest pain at rest 01/06/2013   Chest pain     Diastasis recti 12/12/2008   GBS carrier    First pregnancy   GERD    zantac   H/O chest pain 06/17/11, 06/2013   Resolved 06/2013   H/O varicella    Headache(784.0)    migraines;D/T BP   Herpes    cold sores   History of CT scan of chest    a. Cardiac CTA 10/16: no CAD   History of echocardiogram    a. Echo 10/16: EF 50%, diff HK, mild TR, mild PI   History of kidney stones    passed stone - no surgery required   History of stress test    a. ETT-Echo 10/16: normal ECGs, + LAD area ischemia >> will arrange Cardiac CTA   Hyperlipidemia    Hypertension    LV dysfunction 12/2012, 06/2013   Hx: EF 45-50%, Resolved by Jan 2015 echo- EF 50-55%   Panic attack    HAS BEEN RX'D XANAX   Pelvic pain    With pregnancy   Postpartum hypertension    Preeclampsia 2010,2011   POST PARTUM;WAS PUT ON BP MEDS   Preterm contractions 06/15/2005   GIVEN BETAMETHASONE AND PROCARDIA TO STOP CTXS   Right ovarian cyst 06/15/2001   RT OOPHRECTOMY   Spotting in first trimester    With SAB    Stress    work and at home - no meds   SVD (spontaneous vaginal delivery)    x 5     Past Surgical History:  Procedure Laterality Date   breast cyst removal  06/16/2003   right   DILATION AND CURETTAGE OF UTERUS  06/16/2007   DILITATION & CURRETTAGE/HYSTROSCOPY WITH THERMACHOICE ABLATION N/A 10/11/2013   Procedure: DILATATION & CURETTAGE/HYSTEROSCOPY WITH THERMACHOICE ABLATION;  Surgeon: Ovid DELENA All, MD;  Location: [facility];  Service: Gynecology;  Laterality: N/A;   LAPAROSCOPIC OVARIAN CYSTECTOMY Left 04/25/2021   Procedure: LAPAROSCOPIC OVARIAN CYSTECTOMY and salpingectomy-oophorectomy;  Surgeon: Schermerhorn, Debby PARAS, MD;  Location: [facility] OR[facility];  Service: Gynecology;  Laterality: Left;   LAPAROSCOPY N/A 10/11/2013   Procedure: LAPAROSCOPY OPERATIVE WITH REMOVAL OF HYDROSALPINX ;  Surgeon: Ovid DELENA All, MD;  Location: [facility];  Service: Gynecology;  Laterality: N/A;   ovary removed Right 2003   right - laparotomy   TUBAL LIGATION  07/03/2012   Procedure: POST PARTUM TUBAL LIGATION;  Surgeon: Jon CINDERELLA Rummer, MD;  Location: [facility];  Service: Gynecology;  Laterality: Bilateral;  Bilateral post partum tubal ligation   WISDOM TOOTH  EXTRACTION       Outpatient Encounter Medications as of 12/23/2023  Medication Sig   ondansetron  (ZOFRAN ) 8 MG tablet Take 1 tablet (8 mg total) by mouth every 8 (eight) hours as needed for nausea or vomiting.   prochlorperazine  (COMPAZINE ) 10 MG tablet Take 1 tablet (10 mg total) by mouth every 6 (six) hours as needed for nausea or vomiting.   abemaciclib  (VERZENIO ) 150 MG tablet Take 1 tablet (150 mg total) by mouth 2 (two) times daily.   amLODipine  (NORVASC ) 10 MG tablet Take 10 mg by mouth daily.   atorvastatin  (LIPITOR) 40 MG tablet Take 40 mg by mouth daily.   Cholecalciferol 1.25 MG (50000 UT) capsule Take 1 capsule by mouth daily. Pt to pick up over-the-counter (Patient not taking: Reported on 12/13/2023)   diazepam  (VALIUM ) 5 MG tablet Take 5 mg by  mouth every 8 (eight) hours as needed for anxiety.   dicyclomine  (BENTYL ) 10 MG capsule Take 1 capsule (10 mg total) by mouth 2 (two) times daily as needed for spasms.   DUPIXENT 300 MG/2ML SOAJ Inject 300 mg into the skin every 14 (fourteen) days. Per pt report, can have every 2-4 weeks.   gabapentin  (NEURONTIN ) 300 MG capsule Take 600 mg by mouth at bedtime.   letrozole  (FEMARA ) 2.5 MG tablet Take 1 tablet (2.5 mg total) by mouth daily.   mirtazapine  (REMERON ) 30 MG tablet Take 1 tablet (30 mg total) by mouth at bedtime. (Patient taking differently: Take 30 mg by mouth at bedtime. Patient is taking 15 mg daily per Dr. Vincente.)   nortriptyline  (PAMELOR ) 50 MG capsule Take 150 mg by mouth at bedtime.   pantoprazole  (PROTONIX ) 40 MG tablet Take 1 tablet (40 mg total) by mouth daily.   propranolol  ER (INDERAL  LA) 120 MG 24 hr capsule Take 1 capsule (120 mg total) by mouth daily.   valsartan (DIOVAN) 160 MG tablet Take 160 mg by mouth daily.   No facility-administered encounter medications on file as of 12/23/2023.     There were no vitals filed for this visit. There is no height or weight on file to calculate BMI.   ECOG PERFORMANCE STATUS: {CHL ONC ECOG PS:[id number]}  PHYSICAL EXAM GENERAL:alert, no distress and comfortable SKIN: no rash  EYES: sclera clear NECK: without mass LYMPH:  no palpable cervical or supraclavicular lymphadenopathy  LUNGS: clear with normal breathing effort HEART: regular rate & rhythm, no lower extremity edema ABDOMEN: abdomen soft, non-tender and normal bowel sounds NEURO: alert & oriented x 3 with fluent speech, no focal motor/sensory deficits Breast exam:  PAC without erythema    CBC    Latest Ref Rng & Units 08/17/2023    4:39 AM 04/20/2021    7:05 PM 02/23/2019    6:41 PM  CBC  WBC 4.0 - 10.5 K/uL 6.4  8.7  9.5   Hemoglobin 12.0 - 15.0 g/dL 87.4  87.9  85.2   Hematocrit 36.0 - 46.0 % 39.7  37.3  46.2   Platelets 150 - 400 K/uL 246  298  269        CMP     Latest Ref Rng & Units 08/17/2023    4:39 AM 04/20/2021    7:05 PM 02/23/2019    6:41 PM  CMP  Glucose 70 - 99 mg/dL 91  90  99   BUN 6 - 20 mg/dL 13  13  16    Creatinine 0.44 - 1.00 mg/dL 9.11  9.25  9.03   Sodium 135 -  145 mmol/L 140  138  139   Potassium 3.5 - 5.1 mmol/L 3.9  3.5  3.0   Chloride 98 - 111 mmol/L 107  105  102   CO2 22 - 32 mmol/L 25  24  25    Calcium  8.9 - 10.3 mg/dL 9.7  9.0  89.6   Total Protein 6.5 - 8.1 g/dL 6.6  7.1    Total Bilirubin 0.0 - 1.2 mg/dL 0.4  0.5    Alkaline Phos 38 - 126 U/L 115  65    AST 15 - 41 U/L 24  14    ALT 0 - 44 U/L 16  9        ASSESSMENT & PLAN:  PLAN:  No orders of the defined types were placed in this encounter.     All questions were answered. The patient knows to call the clinic with any problems, questions or concerns. No barriers to learning were detected. I spent *** counseling the patient face to face. The total time spent in the appointment was *** and more than 50% was on counseling, review of test results, and coordination of care.   Amber Yankee K Alvita Fana, NP 12/22/2023 1:35 PM

## 2023-12-22 NOTE — Telephone Encounter (Signed)
 Received prior authorization request for Dicyclomine  10 mg Capsules. Received response that medication was covered except for the [facility] that was requested. Called Pharmacy and requested a change in the [facility] number of medication. Pharmacy received a paid claim with a co-payment of $4.00 with new NDC number. Patient notified by Pharmacy. No other needs or concerns noted at this time.

## 2023-12-23 ENCOUNTER — Inpatient Hospital Stay: Attending: Oncology | Admitting: Nurse Practitioner

## 2023-12-23 ENCOUNTER — Encounter: Payer: Self-pay | Admitting: Nurse Practitioner

## 2023-12-23 ENCOUNTER — Other Ambulatory Visit: Payer: Self-pay

## 2023-12-23 ENCOUNTER — Other Ambulatory Visit (HOSPITAL_COMMUNITY): Payer: Self-pay

## 2023-12-23 DIAGNOSIS — Z1721 Progesterone receptor positive status: Secondary | ICD-10-CM | POA: Insufficient documentation

## 2023-12-23 DIAGNOSIS — C50412 Malignant neoplasm of upper-outer quadrant of left female breast: Secondary | ICD-10-CM | POA: Diagnosis not present

## 2023-12-23 DIAGNOSIS — C773 Secondary and unspecified malignant neoplasm of axilla and upper limb lymph nodes: Secondary | ICD-10-CM | POA: Diagnosis present

## 2023-12-23 DIAGNOSIS — R079 Chest pain, unspecified: Secondary | ICD-10-CM | POA: Diagnosis not present

## 2023-12-23 DIAGNOSIS — Z9013 Acquired absence of bilateral breasts and nipples: Secondary | ICD-10-CM | POA: Diagnosis not present

## 2023-12-23 DIAGNOSIS — Z170 Estrogen receptor positive status [ER+]: Secondary | ICD-10-CM | POA: Diagnosis not present

## 2023-12-23 MED ORDER — ABEMACICLIB 100 MG PO TABS
100.0000 mg | ORAL_TABLET | Freq: Two times a day (BID) | ORAL | 0 refills | Status: DC
  Filled 2023-12-23: qty 56, 28d supply, fill #0

## 2023-12-23 NOTE — Progress Notes (Signed)
 Specialty Pharmacy Refill Coordination Note  Spoke with JANAIAH VETRANO  BRISA AUTH is a 38 y.o. female contacted today regarding refills of specialty medication(s) Abemaciclib  (VERZENIO )  Patient requested: Delivery   Delivery date: 12/24/23   Verified address: [address] [place] [address]  Medication will be filled on 12/23/23.

## 2023-12-24 ENCOUNTER — Other Ambulatory Visit: Payer: Self-pay

## 2023-12-27 ENCOUNTER — Encounter: Payer: Self-pay | Admitting: Nurse Practitioner

## 2023-12-29 ENCOUNTER — Other Ambulatory Visit: Payer: Self-pay | Admitting: Nurse Practitioner

## 2023-12-29 ENCOUNTER — Other Ambulatory Visit: Payer: Self-pay

## 2024-01-02 ENCOUNTER — Emergency Department (HOSPITAL_BASED_OUTPATIENT_CLINIC_OR_DEPARTMENT_OTHER)
Admission: EM | Admit: 2024-01-02 | Discharge: 2024-01-02 | Disposition: A | Discharge status: Home / Self Care | Attending: Emergency Medicine | Admitting: Emergency Medicine

## 2024-01-02 ENCOUNTER — Other Ambulatory Visit: Payer: Self-pay

## 2024-01-02 ENCOUNTER — Encounter (HOSPITAL_BASED_OUTPATIENT_CLINIC_OR_DEPARTMENT_OTHER): Payer: Self-pay

## 2024-01-02 DIAGNOSIS — R0789 Other chest pain: Secondary | ICD-10-CM | POA: Diagnosis not present

## 2024-01-02 DIAGNOSIS — C773 Secondary and unspecified malignant neoplasm of axilla and upper limb lymph nodes: Secondary | ICD-10-CM | POA: Diagnosis not present

## 2024-01-02 DIAGNOSIS — Z5321 Procedure and treatment not carried out due to patient leaving prior to being seen by health care provider: Secondary | ICD-10-CM | POA: Insufficient documentation

## 2024-01-02 DIAGNOSIS — R109 Unspecified abdominal pain: Secondary | ICD-10-CM | POA: Insufficient documentation

## 2024-01-02 NOTE — ED Triage Notes (Signed)
 Pt c/o abd pain, central/ substernal chest pain through to back . Advises she first noticed it a couple hours ago, gradually getting worse. Denies NV, advises constipation really bad, taking a bunch of stuff & had bowel movement this morning but I don't think it was sufficient for how long I was constipated. Oh chemo pill, sometimes it messes up my belly.

## 2024-01-02 NOTE — ED Notes (Signed)
 Pt called out and stated chest pain is gone and feels better. Pt stated she wants and is ready to go home. Provider was made aware.

## 2024-01-02 NOTE — ED Provider Notes (Signed)
 Patient eloped from the exam room prior to any evaluation by myself.  Reportedly walked out without any distress.   Bari Roxie HERO, DO 01/02/24 2154

## 2024-01-10 ENCOUNTER — Telehealth: Payer: Self-pay | Admitting: Oncology

## 2024-01-10 ENCOUNTER — Encounter: Payer: Self-pay | Admitting: Oncology

## 2024-01-10 ENCOUNTER — Other Ambulatory Visit: Payer: Self-pay

## 2024-01-10 NOTE — Telephone Encounter (Signed)
 Called to reschedule FU appt.

## 2024-01-11 NOTE — Telephone Encounter (Signed)
 Placed note on NP desk for review on 7/30

## 2024-01-12 ENCOUNTER — Telehealth: Payer: Self-pay

## 2024-01-12 NOTE — Telephone Encounter (Signed)
 Made patient aware that CT Head w & wo contrast order faxed over to [facility], result-success, suggested to contact them on 01/14/24 to schedule appointment, understood, no further questions. Will also follow-up to make sure imaging is scheduled.

## 2024-01-13 ENCOUNTER — Encounter: Payer: Self-pay | Admitting: Oncology

## 2024-01-13 ENCOUNTER — Telehealth: Payer: Self-pay

## 2024-01-13 NOTE — Telephone Encounter (Addendum)
 Patient called the critical phone stating that she started having severe pain/swelling, burning in both legs this morning, pain is rated at an 8 at the moment. Pain typically comes/ goes in legs, has tried elevating, soaking legs, tried taking Tylenol , nothing has been effective, Gabapentin  helping the numbness in toes but not the pain per patient. Denies having redness/ open wounds, fever, hot/ cold chills. Contacted primary care doctor, unavailable until to see PCP until Monday. Patient is wanting something stronger to help with the severe pain, would make provider covering for Dr. Autumn aware then will follow-up, understood, no further questions. Per Dr. Clovis progress note from 12/13/23 patient has neuropathy, severe leg pain/ swelling as a side effect of Chemotherapy which underwent May-October last year. Forwarded to Lacie Burton NP to review.  02:58 pm: Followed up with patient about going to [facility] tomorrow to be evaluated. Patient denied stating She will just deal with the pain/ swelling just like she has been doing, if it gets worse will go to the Emergency Room.  Notified CT Head w & wo contrast scheduled on 01/14/24 at 12:45 pm with [facility], understood/confirmed by patient.

## 2024-01-26 ENCOUNTER — Ambulatory Visit: Admitting: Nurse Practitioner

## 2024-01-26 ENCOUNTER — Other Ambulatory Visit

## 2024-01-27 ENCOUNTER — Other Ambulatory Visit

## 2024-01-27 ENCOUNTER — Ambulatory Visit: Admitting: Nurse Practitioner

## 2024-02-01 ENCOUNTER — Inpatient Hospital Stay

## 2024-02-01 ENCOUNTER — Other Ambulatory Visit: Payer: Self-pay

## 2024-02-01 ENCOUNTER — Inpatient Hospital Stay: Attending: Oncology | Admitting: Oncology

## 2024-02-01 ENCOUNTER — Encounter: Payer: Self-pay | Admitting: Oncology

## 2024-02-01 VITALS — BP 129/93 | Temp 98.4°F | Ht 65.0 in | Wt 178.2 lb

## 2024-02-01 DIAGNOSIS — C773 Secondary and unspecified malignant neoplasm of axilla and upper limb lymph nodes: Secondary | ICD-10-CM | POA: Diagnosis present

## 2024-02-01 DIAGNOSIS — Z9221 Personal history of antineoplastic chemotherapy: Secondary | ICD-10-CM | POA: Insufficient documentation

## 2024-02-01 DIAGNOSIS — Z79811 Long term (current) use of aromatase inhibitors: Secondary | ICD-10-CM | POA: Diagnosis not present

## 2024-02-01 DIAGNOSIS — I789 Disease of capillaries, unspecified: Secondary | ICD-10-CM | POA: Insufficient documentation

## 2024-02-01 DIAGNOSIS — Z170 Estrogen receptor positive status [ER+]: Secondary | ICD-10-CM | POA: Insufficient documentation

## 2024-02-01 DIAGNOSIS — Z1721 Progesterone receptor positive status: Secondary | ICD-10-CM | POA: Insufficient documentation

## 2024-02-01 DIAGNOSIS — Z79899 Other long term (current) drug therapy: Secondary | ICD-10-CM | POA: Diagnosis not present

## 2024-02-01 DIAGNOSIS — C50412 Malignant neoplasm of upper-outer quadrant of left female breast: Secondary | ICD-10-CM

## 2024-02-01 DIAGNOSIS — R609 Edema, unspecified: Secondary | ICD-10-CM | POA: Diagnosis not present

## 2024-02-01 DIAGNOSIS — Z1732 Human epidermal growth factor receptor 2 negative status: Secondary | ICD-10-CM | POA: Insufficient documentation

## 2024-02-01 DIAGNOSIS — M79606 Pain in leg, unspecified: Secondary | ICD-10-CM | POA: Insufficient documentation

## 2024-02-01 LAB — CBC WITH DIFFERENTIAL (CANCER CENTER ONLY)
Abs Immature Granulocytes: 0.02 K/uL (ref 0.00–0.07)
Basophils Absolute: 0.1 K/uL (ref 0.0–0.1)
Basophils Relative: 1 %
Eosinophils Absolute: 0.1 K/uL (ref 0.0–0.5)
Eosinophils Relative: 2 %
HCT: 37.6 % (ref 36.0–46.0)
Hemoglobin: 12.1 g/dL (ref 12.0–15.0)
Immature Granulocytes: 0 %
Lymphocytes Relative: 20 %
Lymphs Abs: 1.3 K/uL (ref 0.7–4.0)
MCH: 25.7 pg — ABNORMAL LOW (ref 26.0–34.0)
MCHC: 32.2 g/dL (ref 30.0–36.0)
MCV: 79.8 fL — ABNORMAL LOW (ref 80.0–100.0)
Monocytes Absolute: 0.4 K/uL (ref 0.1–1.0)
Monocytes Relative: 7 %
Neutro Abs: 4.6 K/uL (ref 1.7–7.7)
Neutrophils Relative %: 70 %
Platelet Count: 256 K/uL (ref 150–400)
RBC: 4.71 MIL/uL (ref 3.87–5.11)
RDW: 14.9 % (ref 11.5–15.5)
WBC Count: 6.6 K/uL (ref 4.0–10.5)
nRBC: 0 % (ref 0.0–0.2)

## 2024-02-01 LAB — CMP (CANCER CENTER ONLY)
ALT: 15 U/L (ref 0–44)
AST: 24 U/L (ref 15–41)
Albumin: 4.4 g/dL (ref 3.5–5.0)
Alkaline Phosphatase: 151 U/L — ABNORMAL HIGH (ref 38–126)
Anion gap: 13 (ref 5–15)
BUN: 16 mg/dL (ref 6–20)
CO2: 24 mmol/L (ref 22–32)
Calcium: 10 mg/dL (ref 8.9–10.3)
Chloride: 105 mmol/L (ref 98–111)
Creatinine: 0.99 mg/dL (ref 0.44–1.00)
GFR, Estimated: >60 mL/min (ref >60)
Glucose, Bld: 101 mg/dL — ABNORMAL HIGH (ref 70–99)
Potassium: 3.9 mmol/L (ref 3.5–5.1)
Sodium: 142 mmol/L (ref 135–145)
Total Bilirubin: 0.3 mg/dL (ref 0.0–1.2)
Total Protein: 7.1 g/dL (ref 6.5–8.1)

## 2024-02-01 MED ORDER — ABEMACICLIB 50 MG PO TABS
50.0000 mg | ORAL_TABLET | Freq: Two times a day (BID) | ORAL | 2 refills | Status: AC
  Filled 2024-02-01: qty 70, 35d supply, fill #0
  Filled 2024-02-03: qty 56, 28d supply, fill #0
  Filled 2024-02-28: qty 56, 28d supply, fill #1

## 2024-02-01 NOTE — Progress Notes (Signed)
 [facility]  ONCOLOGY CLINIC PROGRESS NOTE   Patient Care Team: Emilio Joesph VEAR DEVONNA as PCP - General (Physician Assistant)  PATIENT NAME: [name]   MR#: [id number] DOB: 27-Feb-1986  Date of visit: 02/01/2024   ASSESSMENT & PLAN:   [name] is a 38 y.o.  lady with a past medical history of anxiety/depression, panic attacks, hypertension, dyslipidemia, was diagnosed with stage II left breast cancer, ER positive, PR positive, HER2/neu negative, diagnosed in April 2024.  Previously she was seen at [facility] by Dr. Kelby.  Patient wanted to transfer her care to our facility and hence she was referred to our clinic.  She established with our clinic in June 2025.  Carcinoma of upper-outer quadrant of left breast in female, estrogen receptor positive (HCC) Please review oncology history for additional details and timeline of events.  She was originally diagnosed in April 2024.  ER positive, PR positive, HER2/neu negative by FISH.  10/28/2022- 03/16/2023 neoadjuvant chemotherapy with dose dense AC/weekly Taxol   04/14/2023 bilateral mastectomy with Dr. Kirt =ypT1bN1a: 2 separate primary 8 mm and 9 mm 1 out of 8 lymph node involved RCB II  06/01/2019 24-2 07/19/2023 adjuvant radiation therapy at [facility] with Dr Lauraine Golden: 50.4 Gy in 28 frx 08/03/2023 started adjuvant endocrine therapy with letrozole .   Previously Dr. Kelby proposed adding adjuvant Abemaciclib  but patient was undecided at that time.  Reviewed clinical trial data of MonarchE study.  Given overall benefit in disease-free survival and recurrence risk, patient was agreeable to proceeding with abemaciclib .  Plan is to start at 150 mg p.o. twice daily dosing for planned duration of 2 years.  Discussed side effect profile including leukopenia/neutropenia, diarrhea, fatigue, nausea Septra.  Patient verbalized understanding.  Prescription sent to specialty pharmacy.  CT of the head from March 2025  which showed 3 mm nonspecific lesion in the posterior pons.  MRI could not be obtained as patient is undergoing breast reconstruction with expanders.  She started taking abemaciclib  from 12/20/2023.  Patient had abdominal cramping, nausea, vomiting that coincided with her diet which involved mushroom coffees.  Verzenio  was held briefly and dose reduced to 100 mg p.o. twice daily from 12/23/2023.  Patient continued to have some lower extremity pains, numbness, tingling, swelling and dose was reduced further from 02/01/2024 to 50 mg p.o. twice daily.  If persistent symptoms Verzenio  will be to be discontinued and we will continue letrozole  alone.  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  Will obtain MRI of the brain for further evaluation, now that she does not have breast tissue expanders.   Adverse gastrointestinal effects of abemaciclib  (Verzenio ) therapy She experiences significant gastrointestinal side effects from abemaciclib , including severe stomach pain and cramping, impacting her quality of life. She is willing to try a further dose reduction to manage side effects while maintaining potential benefits of therapy. - Reduce abemaciclib  dose to 50 mg twice daily - Monitor for gastrointestinal side effects - Use dicyclomine  and Zofran  30 minutes before abemaciclib  to manage side effects  Lower extremity edema and pain Intermittent lower extremity edema and pain, likely due to dependent edema. Symptoms worsen throughout the day. Compression stockings have been used. No signs of deep vein thrombosis or other serious conditions. - Continue use of compression stockings  Incidental capillary telangiectasia of pons Incidental finding of a 3 mm capillary telangiectasia in the pons, stable since  March 2025. It is too small to cause symptoms and may be a congenital finding. - Order MRI with contrast at [facility] for  better evaluation of capillary telangiectasia  RTC in 4 weeks for follow-up with repeat labs.  I reviewed lab results and outside records for this visit and discussed relevant results with the patient. Diagnosis, plan of care and treatment options were also discussed in detail with the patient. Opportunity provided to ask questions and answers provided to her apparent satisfaction. Provided instructions to call our clinic with any problems, questions or concerns prior to return visit. I recommended to continue follow-up with PCP and sub-specialists. She verbalized understanding and agreed with the plan.   NCCN guidelines have been consulted in the planning of this patient's care.  I spent a total of 30 minutes during this encounter with the patient including review of chart and various tests results, discussions about plan of care and coordination of care plan.   Chinita Patten, MD  02/01/2024 4:38 PM  [facility] - A DEPT OF [facility] [address] [place] [address] Dept: [phone] Dept Fax: [phone]    CHIEF COMPLAINT/ REASON FOR VISIT:   Stage II left breast cancer, ER positive, PR positive, HER2/neu negative, diagnosed in April 2024   Current Treatment: Adjuvant hormonal therapy with letrozole , started from February 2025.  Adjuvant Verzenio  started from 12/20/2023 with poor tolerance.  Dose reduced to 50 mg p.o. twice daily from 02/01/2024.  INTERVAL HISTORY:    Discussed the use of AI scribe software for clinical note transcription with the patient, who gave verbal consent to proceed.  History of Present Illness [name] is a 38 year old female with breast cancer in remission who presents with leg pain and swelling. She was referred by her primary care physician to consult her oncologist regarding these symptoms.  She experiences intermittent leg pain and swelling, described as a tight  feeling in her legs accompanied by pain. The symptoms began after chemotherapy treatment and worsen by the afternoon. She uses compression socks to manage the swelling, which she describes as 'shiny' and 'throbbing'.  She started taking Verzenio  on July 7th but experienced severe stomach pain within a few days, which prevented her from getting off the floor. After consulting with a nurse practitioner, her dose was reduced, but she continued to experience stomach pain and cramps. She takes dicyclomine  and Zofran  30 minutes before Verzenio  to manage these side effects and ensures she has food in her stomach before taking the medication. She has stopped drinking mushroom coffee. She has been on letrozole  since February and reports feeling tired but no other major issues.  A CT scan of her head performed on August 1st revealed a 3 mm lesion in the pons. This lesion has been present since at least March 2025 and has not changed in size. She reports a tingling sensation in her head but is unsure if it is related to the lesion.  She has been using Dupixent injections for eczema, which she administers once a month instead of the prescribed bi-weekly schedule. Her eczema was previously severe on her hands but has since cleared. Recently, she developed a rash on her legs, which she attributes to stress from moving her daughters to college.  She recently started a new job at [employer] and has been there for about a month. She enjoys her new role.    I have  reviewed the past medical history, past surgical history, social history and family history with the patient and they are unchanged from previous note.  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  09/30/2022 diagnostic mammogram = 3.9 cm  lesion upper outer quadrant lesion, a 1.5 cm upper outer quadrant lesion in the left breast + indeterminate axilla  10/02/2022 left breast biopsy and left axillary biopsy #1 mass/ 11 cm from nipple (3.9 cm) = invasive ductal carcinoma, grade 3, ER 99%, PR 97, HER2 2+ FISH negative #2 mass/ 8 cm from nipple (1.5cm)= invasive ductal carcinoma ER 99%, PR negative, HER2 2+ FISH negative Left axillary lymph node = invasive ductal carcinoma, grade 3 ER 99% PR 99% HER2 2+ FISH negative   10/28/2022- 03/16/2023 neoadjuvant chemotherapy with dose dense AC/weekly Taxol    04/14/2023 bilateral mastectomy with Dr. Kirt =ypT1bN1a: 2 separate primary 8 mm and 9 mm 1 out of 8 lymph node involved RCB II   06/01/23- 07/19/2023 adjuvant radiation therapy at [facility] with Dr Lauraine Golden: 50.4 Gy in 28 frx  08/03/2023 started adjuvant endocrine therapy with letrozole    Dr. Kelby was planning to add adjuvant abemaciclib .    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 In addition, she was diagnosed before the age of 67. She meets criteria for genetic testing. She had telemedicine visit with the genetic counselor on 10/15/2022. She had a 71 Ambry gene panel. 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   A referral has been placed for her to see Dr. Arlee with gynecology oncology. The appointment was scheduled for February 04, 2023. The patient canceled and never rescheduled.   It should be noted that she is status post bilateral oophorectomy for teratomas. One ovary was removed at the age of 20 and another one was removed in 2022. It is not clear if the fallopian tubes were also removed during both surgeries.   Patient wanted to transfer care to our facility and hence she presented to establish with us  on 12/13/2023.  Plan made to continue letrozole .  Patient was agreeable to adding abemaciclib  at 150 mg p.o. twice daily, with planned duration of 2 years.    She started taking  abemaciclib  from 12/20/2023.  Patient had abdominal cramping, nausea, vomiting that coincided with her diet which involved mushroom coffees.  Verzenio  was held briefly and dose reduced to 100 mg p.o. twice daily from 12/23/2023.  Patient continued to have some lower extremity pains, numbness, tingling, swelling and dose was reduced further from 02/01/2024 to 50 mg p.o. twice daily.  If persistent symptoms Verzenio  will be to be discontinued and we will continue letrozole  alone.  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  Will obtain MRI of the brain for further evaluation, now that she does not have breast tissue expanders.  Oncology History  Carcinoma of upper-outer quadrant of left breast in female, estrogen receptor positive (HCC)  05/04/2023 Cancer Staging   Staging form: Breast, AJCC 8th Edition - Pathologic stage from 05/04/2023: ypT1b, ypN1a(sn), cM0, G3, ER+, PR+, HER2- - Signed by Autumn Millman, MD on 12/13/2023 Stage prefix: Post-therapy Response to neoadjuvant therapy: Partial response Method of lymph node assessment: Sentinel  lymph node biopsy Nuclear grade: G3 Multigene prognostic tests performed: Other Histologic grading system: 3 grade system Residual tumor (R): R0   05/11/2023 Initial Diagnosis   Malignant neoplasm of upper outer quadrant of female breast (HCC)       REVIEW OF SYSTEMS:   Review of Systems - Oncology  All other pertinent systems were reviewed with the patient and are negative.  ALLERGIES: She is allergic to hydrochlorothiazide, labetalol, and lisinopril .  MEDICATIONS:  Current Outpatient Medications  Medication Sig Dispense Refill   amLODipine  (NORVASC ) 10 MG tablet Take 10 mg by mouth daily.     atorvastatin  (LIPITOR) 40 MG tablet Take 40 mg by mouth daily.     Cholecalciferol 1.25 MG (50000 UT) capsule Take 1 capsule by mouth daily. Pt to pick up  over-the-counter     diazepam  (VALIUM ) 5 MG tablet Take 5 mg by mouth every 8 (eight) hours as needed for anxiety.     dicyclomine  (BENTYL ) 10 MG capsule TAKE 1 CAPSULE (10 MG TOTAL) BY MOUTH 2 (TWO) TIMES DAILY AS NEEDED FOR SPASMS. 180 capsule 1   DUPIXENT 300 MG/2ML SOAJ Inject 300 mg into the skin every 14 (fourteen) days. Per pt report, can have every 2-4 weeks.     gabapentin  (NEURONTIN ) 300 MG capsule Take 600 mg by mouth at bedtime.     letrozole  (FEMARA ) 2.5 MG tablet Take 1 tablet (2.5 mg total) by mouth daily. 90 tablet 3   mirtazapine  (REMERON ) 30 MG tablet Take 1 tablet (30 mg total) by mouth at bedtime. 30 tablet 0   nortriptyline  (PAMELOR ) 50 MG capsule Take 150 mg by mouth at bedtime.     ondansetron  (ZOFRAN ) 8 MG tablet Take 1 tablet (8 mg total) by mouth every 8 (eight) hours as needed for nausea or vomiting. 30 tablet 3   pantoprazole  (PROTONIX ) 40 MG tablet Take 1 tablet (40 mg total) by mouth daily. 30 tablet 0   prochlorperazine  (COMPAZINE ) 10 MG tablet Take 1 tablet (10 mg total) by mouth every 6 (six) hours as needed for nausea or vomiting. 30 tablet 3   propranolol  ER (INDERAL  LA) 120 MG 24 hr capsule Take 1 capsule (120 mg total) by mouth daily. 30 capsule 0   valsartan (DIOVAN) 160 MG tablet Take 160 mg by mouth daily.     abemaciclib  (VERZENIO ) 50 MG tablet Take 1 tablet (50 mg total) by mouth 2 (two) times daily. 60 tablet 2   No current facility-administered medications for this visit.     VITALS:   Blood pressure (!) 129/93, temperature 98.4 F (36.9 C), temperature source Oral, height 5' 5 (1.651 m), weight 178 lb 3.2 oz (80.8 kg).  Wt Readings from Last 3 Encounters:  02/01/24 178 lb 3.2 oz (80.8 kg)  12/23/23 171 lb 3.2 oz (77.7 kg)  12/13/23 177 lb 4.8 oz (80.4 kg)    Body mass index is 29.65 kg/m.    Onc Performance Status - 02/01/24 0902       ECOG Perf Status   ECOG Perf Status Restricted in physically strenuous activity but ambulatory and  able to carry out work of a light or sedentary nature, e.g., light house work, office work      KPS SCALE   KPS % SCORE Normal activity with effort, some s/s of disease          PHYSICAL EXAM:   Physical Exam Constitutional:      General: She is not in acute distress.  Appearance: Normal appearance.  HENT:     Head: Normocephalic and atraumatic.  Cardiovascular:     Rate and Rhythm: Normal rate.  Pulmonary:     Effort: Pulmonary effort is normal. No respiratory distress.  Abdominal:     General: There is no distension.  Neurological:     General: No focal deficit present.     Mental Status: She is alert and oriented to person, place, and time.  Psychiatric:        Mood and Affect: Mood normal.        Behavior: Behavior normal.       LABORATORY DATA:   I have reviewed the data as listed.  Results for orders placed or performed in visit on 02/01/24  CMP (Cancer Center only)  Result Value Ref Range   Sodium 142 135 - 145 mmol/L   Potassium 3.9 3.5 - 5.1 mmol/L   Chloride 105 98 - 111 mmol/L   CO2 24 22 - 32 mmol/L   Glucose, Bld 101 (H) 70 - 99 mg/dL   BUN 16 6 - 20 mg/dL   Creatinine 9.00 9.55 - 1.00 mg/dL   Calcium  10.0 8.9 - 10.3 mg/dL   Total Protein 7.1 6.5 - 8.1 g/dL   Albumin 4.4 3.5 - 5.0 g/dL   AST 24 15 - 41 U/L   ALT 15 0 - 44 U/L   Alkaline Phosphatase 151 (H) 38 - 126 U/L   Total Bilirubin 0.3 0.0 - 1.2 mg/dL   GFR, Estimated >39 >39 mL/min   Anion gap 13 5 - 15  CBC with Differential (Cancer Center Only)  Result Value Ref Range   WBC Count 6.6 4.0 - 10.5 K/uL   RBC 4.71 3.87 - 5.11 MIL/uL   Hemoglobin 12.1 12.0 - 15.0 g/dL   HCT 62.3 63.9 - 53.9 %   MCV 79.8 (L) 80.0 - 100.0 fL   MCH 25.7 (L) 26.0 - 34.0 pg   MCHC 32.2 30.0 - 36.0 g/dL   RDW 85.0 88.4 - 84.4 %   Platelet Count 256 150 - 400 K/uL   nRBC 0.0 0.0 - 0.2 %   Neutrophils Relative % 70 %   Neutro Abs 4.6 1.7 - 7.7 K/uL   Lymphocytes Relative 20 %   Lymphs Abs 1.3 0.7 - 4.0  K/uL   Monocytes Relative 7 %   Monocytes Absolute 0.4 0.1 - 1.0 K/uL   Eosinophils Relative 2 %   Eosinophils Absolute 0.1 0.0 - 0.5 K/uL   Basophils Relative 1 %   Basophils Absolute 0.1 0.0 - 0.1 K/uL   Immature Granulocytes 0 %   Abs Immature Granulocytes 0.02 0.00 - 0.07 K/uL      RADIOGRAPHIC STUDIES:  I reviewed CT head report from [facility] on 01/14/2024 which showed stable 3 mm telangiectasia in the posterior pons.  CODE STATUS:  Code Status History     Date Active Date Inactive Code Status Order ID Comments User Context   08/17/2023 2340 08/21/2023 1726 Full Code [id number]  Randall Starlyn HERO, NP Inpatient   08/17/2023 2340 08/17/2023 2340 Full Code [id number]  Randall Starlyn HERO, NP Inpatient   08/17/2023 0418 08/17/2023 2249 Full Code [id number]  Alan Thurman GAILS, NP ED   04/25/2021 1052 04/25/2021 2204 Full Code [id number]  Schermerhorn, Debby PARAS, MD Inpatient   10/18/2013 2030 10/19/2013 1915 Full Code [id number]  Verta Blossom, CNM Inpatient   07/02/2012 2101 07/04/2012 1534 Full Code [id number]  Shoptaw, Benedict Helling, RN Inpatient  Questions for Most Recent Historical Code Status (Order [id number])     Question Answer   By: Consent: discussion documented in EHR            No orders of the defined types were placed in this encounter.    Future Appointments  Date Time Provider Department Center  02/29/2024  9:30 AM [facility] PHLEBOTOMIST [facility] None  02/29/2024  9:45 AM Tou Hayner, Chinita, MD [facility] None      This document was completed utilizing speech recognition software. Grammatical errors, random word insertions, pronoun errors, and incomplete sentences are an occasional consequence of this system due to software limitations, ambient noise, and hardware issues. Any formal questions or concerns about the content, text or information contained within the body of this dictation should be directly addressed to the provider for clarification.

## 2024-02-01 NOTE — Assessment & Plan Note (Signed)
 Please review oncology history for additional details and timeline of events.  She was originally diagnosed in April 2024.  ER positive, PR positive, HER2/neu negative by FISH.  10/28/2022- 03/16/2023 neoadjuvant chemotherapy with dose dense AC/weekly Taxol   04/14/2023 bilateral mastectomy with Dr. Kirt =ypT1bN1a: 2 separate primary 8 mm and 9 mm 1 out of 8 lymph node involved RCB II  06/01/2019 24-2 07/19/2023 adjuvant radiation therapy at [facility] with Dr Lauraine Golden: 50.4 Gy in 28 frx 08/03/2023 started adjuvant endocrine therapy with letrozole .   Previously Dr. Kelby proposed adding adjuvant Abemaciclib  but patient was undecided at that time.  Reviewed clinical trial data of MonarchE study.  Given overall benefit in disease-free survival and recurrence risk, patient was agreeable to proceeding with abemaciclib .  Plan is to start at 150 mg p.o. twice daily dosing for planned duration of 2 years.  Discussed side effect profile including leukopenia/neutropenia, diarrhea, fatigue, nausea Septra.  Patient verbalized understanding.  Prescription sent to specialty pharmacy.  CT of the head from March 2025 which showed 3 mm nonspecific lesion in the posterior pons.  MRI could not be obtained as patient is undergoing breast reconstruction with expanders.  She started taking abemaciclib  from 12/20/2023.  Patient had abdominal cramping, nausea, vomiting that coincided with her diet which involved mushroom coffees.  Verzenio  was held briefly and dose reduced to 100 mg p.o. twice daily from 12/23/2023.  Patient continued to have some lower extremity pains, numbness, tingling, swelling and dose was reduced further from 02/01/2024 to 50 mg p.o. twice daily.  If persistent symptoms Verzenio  will be to be discontinued and we will continue letrozole  alone.  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  Will obtain MRI of the brain  for further evaluation, now that she does not have breast tissue expanders.

## 2024-02-02 ENCOUNTER — Other Ambulatory Visit: Payer: Self-pay

## 2024-02-02 DIAGNOSIS — G939 Disorder of brain, unspecified: Secondary | ICD-10-CM

## 2024-02-03 ENCOUNTER — Encounter: Payer: Self-pay | Admitting: Oncology

## 2024-02-03 ENCOUNTER — Other Ambulatory Visit: Payer: Self-pay

## 2024-02-03 MED ORDER — FUROSEMIDE 20 MG PO TABS: 20.0000 mg | ORAL_TABLET | Freq: Every day | ORAL | 1 refills | Status: AC | PRN

## 2024-02-03 NOTE — Progress Notes (Signed)
 Specialty Pharmacy Refill Coordination Note  [name] is a 38 y.o. female contacted today regarding refills of specialty medication(s) Abemaciclib  (VERZENIO )   Patient requested Delivery   Delivery date: 02/04/24   Verified address: [address] [place] [address]   Medication will be filled on 08.21.25 if possible.

## 2024-02-03 NOTE — Progress Notes (Signed)
 Lasix  20 mg po daily, prn, for leg swelling. Patient encouraged to continue wearing compression socks.

## 2024-02-03 NOTE — Progress Notes (Signed)
 Specialty Pharmacy Ongoing Clinical Assessment Note  [name] is a 38 y.o. female who is being followed by the specialty pharmacy service for RxSp Oncology   Patient's specialty medication(s) reviewed today: Abemaciclib  (VERZENIO )   Missed doses in the last 4 weeks: 0   Patient/Caregiver did not have any additional questions or concerns.   Therapeutic benefit summary: Unable to assess   Adverse events/side effects summary: Experienced adverse events/side effects (dose reduced 8/19 after holding medication at provider direction due to adverse effects)   Patient's therapy is appropriate to: Continue    Goals Addressed             This Visit's Progress    Maintain optimal adherence to therapy       Patient is on track. Patient will maintain adherence          Follow up: 3 months  Dorathea Faerber M Arienne Gartin Specialty Pharmacist

## 2024-02-24 ENCOUNTER — Other Ambulatory Visit: Payer: Self-pay

## 2024-02-28 ENCOUNTER — Other Ambulatory Visit: Payer: Self-pay

## 2024-02-29 ENCOUNTER — Inpatient Hospital Stay

## 2024-02-29 ENCOUNTER — Ambulatory Visit: Admitting: Oncology

## 2024-03-08 ENCOUNTER — Other Ambulatory Visit: Payer: Self-pay | Admitting: Oncology

## 2024-03-08 MED ORDER — DIAZEPAM 5 MG PO TABS: ORAL_TABLET | ORAL | 0 refills | Status: AC

## 2024-03-16 ENCOUNTER — Telehealth: Payer: Self-pay

## 2024-03-16 ENCOUNTER — Other Ambulatory Visit: Payer: Self-pay

## 2024-03-16 DIAGNOSIS — G939 Disorder of brain, unspecified: Secondary | ICD-10-CM

## 2024-03-16 DIAGNOSIS — G620 Drug-induced polyneuropathy: Secondary | ICD-10-CM

## 2024-03-16 DIAGNOSIS — C50412 Malignant neoplasm of upper-outer quadrant of left female breast: Secondary | ICD-10-CM

## 2024-03-16 NOTE — Telephone Encounter (Signed)
 A voicemail was left with patient informing that an internal referral was requested for her uncontrolled lower leg pain per patient's request and Dr. Clovis order.

## 2024-03-16 NOTE — Telephone Encounter (Signed)
 Left a voicemail as a follow up to patient's voicemail yesterday regarding concerns for uncontrolled pain to her lower extremities. Dr. Autumn aware and gave the following advice:  She can stop Verzenio /abemaciclib  and hopefully this will help relieve her symptoms. Unfortunately some of the symptoms were there even before she started the medication.  The above advice was shared via voicemail with the patient this morning.

## 2024-03-27 ENCOUNTER — Telehealth: Payer: Self-pay

## 2024-03-27 ENCOUNTER — Inpatient Hospital Stay

## 2024-03-27 ENCOUNTER — Inpatient Hospital Stay: Admitting: Oncology

## 2024-03-27 NOTE — Telephone Encounter (Signed)
 Followed up on patient's missed Lab and Dr. Autumn Oncology appointments.  Informed patient that a scheduler would reach out to her to rescheduled missed appointments.

## 2024-03-28 ENCOUNTER — Inpatient Hospital Stay: Attending: Oncology

## 2024-03-28 ENCOUNTER — Inpatient Hospital Stay (HOSPITAL_BASED_OUTPATIENT_CLINIC_OR_DEPARTMENT_OTHER): Admitting: Oncology

## 2024-03-28 VITALS — BP 162/108 | HR 80 | Temp 97.1°F | Resp 20 | Ht 65.0 in | Wt 184.2 lb

## 2024-03-28 DIAGNOSIS — Z1721 Progesterone receptor positive status: Secondary | ICD-10-CM | POA: Insufficient documentation

## 2024-03-28 DIAGNOSIS — Z9013 Acquired absence of bilateral breasts and nipples: Secondary | ICD-10-CM | POA: Diagnosis not present

## 2024-03-28 DIAGNOSIS — R140 Abdominal distension (gaseous): Secondary | ICD-10-CM | POA: Insufficient documentation

## 2024-03-28 DIAGNOSIS — R109 Unspecified abdominal pain: Secondary | ICD-10-CM | POA: Insufficient documentation

## 2024-03-28 DIAGNOSIS — R635 Abnormal weight gain: Secondary | ICD-10-CM | POA: Insufficient documentation

## 2024-03-28 DIAGNOSIS — K5909 Other constipation: Secondary | ICD-10-CM | POA: Insufficient documentation

## 2024-03-28 DIAGNOSIS — C50412 Malignant neoplasm of upper-outer quadrant of left female breast: Secondary | ICD-10-CM | POA: Diagnosis present

## 2024-03-28 DIAGNOSIS — Z1732 Human epidermal growth factor receptor 2 negative status: Secondary | ICD-10-CM | POA: Diagnosis not present

## 2024-03-28 DIAGNOSIS — R252 Cramp and spasm: Secondary | ICD-10-CM | POA: Diagnosis not present

## 2024-03-28 DIAGNOSIS — R748 Abnormal levels of other serum enzymes: Secondary | ICD-10-CM | POA: Insufficient documentation

## 2024-03-28 DIAGNOSIS — Z79899 Other long term (current) drug therapy: Secondary | ICD-10-CM | POA: Diagnosis not present

## 2024-03-28 DIAGNOSIS — R5383 Other fatigue: Secondary | ICD-10-CM | POA: Diagnosis not present

## 2024-03-28 DIAGNOSIS — R1084 Generalized abdominal pain: Secondary | ICD-10-CM

## 2024-03-28 DIAGNOSIS — Z170 Estrogen receptor positive status [ER+]: Secondary | ICD-10-CM | POA: Diagnosis not present

## 2024-03-28 DIAGNOSIS — R112 Nausea with vomiting, unspecified: Secondary | ICD-10-CM | POA: Insufficient documentation

## 2024-03-28 DIAGNOSIS — C773 Secondary and unspecified malignant neoplasm of axilla and upper limb lymph nodes: Secondary | ICD-10-CM | POA: Diagnosis present

## 2024-03-28 LAB — CMP (CANCER CENTER ONLY)
ALT: 21 U/L (ref 0–44)
AST: 29 U/L (ref 15–41)
Albumin: 4.8 g/dL (ref 3.5–5.0)
Alkaline Phosphatase: 164 U/L — ABNORMAL HIGH (ref 38–126)
Anion gap: 12 (ref 5–15)
BUN: 13 mg/dL (ref 6–20)
CO2: 26 mmol/L (ref 22–32)
Calcium: 10.6 mg/dL — ABNORMAL HIGH (ref 8.9–10.3)
Chloride: 102 mmol/L (ref 98–111)
Creatinine: 0.9 mg/dL (ref 0.44–1.00)
GFR, Estimated: >60 mL/min (ref >60)
Glucose, Bld: 90 mg/dL (ref 70–99)
Potassium: 3.6 mmol/L (ref 3.5–5.1)
Sodium: 140 mmol/L (ref 135–145)
Total Bilirubin: 0.4 mg/dL (ref 0.0–1.2)
Total Protein: 7.8 g/dL (ref 6.5–8.1)

## 2024-03-28 LAB — CBC WITH DIFFERENTIAL (CANCER CENTER ONLY)
Abs Immature Granulocytes: 0.02 K/uL (ref 0.00–0.07)
Basophils Absolute: 0 K/uL (ref 0.0–0.1)
Basophils Relative: 0 %
Eosinophils Absolute: 0.1 K/uL (ref 0.0–0.5)
Eosinophils Relative: 1 %
HCT: 42 % (ref 36.0–46.0)
Hemoglobin: 13.7 g/dL (ref 12.0–15.0)
Immature Granulocytes: 0 %
Lymphocytes Relative: 21 %
Lymphs Abs: 1.5 K/uL (ref 0.7–4.0)
MCH: 25.8 pg — ABNORMAL LOW (ref 26.0–34.0)
MCHC: 32.6 g/dL (ref 30.0–36.0)
MCV: 79.2 fL — ABNORMAL LOW (ref 80.0–100.0)
Monocytes Absolute: 0.5 K/uL (ref 0.1–1.0)
Monocytes Relative: 7 %
Neutro Abs: 5.1 K/uL (ref 1.7–7.7)
Neutrophils Relative %: 71 %
Platelet Count: 237 K/uL (ref 150–400)
RBC: 5.3 MIL/uL — ABNORMAL HIGH (ref 3.87–5.11)
RDW: 14.4 % (ref 11.5–15.5)
WBC Count: 7.2 K/uL (ref 4.0–10.5)
nRBC: 0 % (ref 0.0–0.2)

## 2024-03-28 LAB — MAGNESIUM: Magnesium: 2.2 mg/dL (ref 1.7–2.4)

## 2024-03-28 NOTE — Progress Notes (Signed)
 [facility]  ONCOLOGY CLINIC PROGRESS NOTE   Patient Care Team: Emilio Joesph VEAR DEVONNA as PCP - General (Physician Assistant)  PATIENT NAME: [name]   MR#: [id number] DOB: 24-Oct-1985  Date of visit: 03/28/2024   ASSESSMENT & PLAN:   [name] is a 38 y.o.  lady with a past medical history of anxiety/depression, panic attacks, hypertension, dyslipidemia, was diagnosed with stage II left breast cancer, ER positive, PR positive, HER2/neu negative, diagnosed in April 2024.  Previously she was seen at [facility] by Dr. Kelby.  Patient wanted to transfer her care to our facility and hence she was referred to our clinic.  She established with our clinic in June 2025.  Carcinoma of upper-outer quadrant of left breast in female, estrogen receptor positive (HCC) Please review oncology history for additional details and timeline of events.  She was originally diagnosed in April 2024.  ER positive, PR positive, HER2/neu negative by FISH.  10/28/2022- 03/16/2023 neoadjuvant chemotherapy with dose dense AC/weekly Taxol   04/14/2023 bilateral mastectomy with Dr. Kirt =ypT1bN1a: 2 separate primary 8 mm and 9 mm 1 out of 8 lymph node involved RCB II  06/01/2019 24-2 07/19/2023 adjuvant radiation therapy at [facility] with Dr Lauraine Golden: 50.4 Gy in 28 frx 08/03/2023 started adjuvant endocrine therapy with letrozole .   Previously Dr. Kelby proposed adding adjuvant Abemaciclib  but patient was undecided at that time.  Reviewed clinical trial data of MonarchE study.  Given overall benefit in disease-free survival and recurrence risk, patient was agreeable to proceeding with abemaciclib .  Plan is to start at 150 mg p.o. twice daily dosing for planned duration of 2 years.  Discussed side effect profile including leukopenia/neutropenia, diarrhea, fatigue, nausea Septra.  Patient verbalized understanding.  Prescription sent to specialty pharmacy.  CT of the head from March 2025  which showed 3 mm nonspecific lesion in the posterior pons.  MRI could not be obtained as patient is undergoing breast reconstruction with expanders.  She started taking abemaciclib  from 12/20/2023.  Patient had abdominal cramping, nausea, vomiting that coincided with her diet which involved mushroom coffees.  Verzenio  was held briefly and dose reduced to 100 mg p.o. twice daily from 12/23/2023.  Patient continued to have some lower extremity pains, numbness, tingling, swelling and dose was reduced further from 02/01/2024 to 50 mg p.o. twice daily.  If persistent symptoms Verzenio  will be to be discontinued and we will continue letrozole  alone.  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  Will obtain MRI of the brain for further evaluation, now that she does not have breast tissue expanders.  Because of persistent GI side effects, arthralgias, myalgias, Verzenio  was discontinued and letrozole  alone continued from September 2025.  She is tolerating letrozole  better.  Constipation with abdominal pain and bloating Chronic constipation with recent exacerbation causing abdominal pain and bloating. Symptoms include infrequent bowel movements, hard stools, and abdominal distension. Vomiting occurred after using over-the-counter medications. Diffuse abdominal pain with lateral pressure. Possible contribution to elevated alkaline phosphatase levels. - Recommend daily Miralax. - Consider reintroducing mushroom coffee to aid regularity. - Suggest Dulcolax suppository if bowel movements do not improve. - Order abdominal ultrasound to evaluate underlying causes of abdominal pain and elevated alkaline phosphatase.  Nausea and vomiting Recent episodes possibly related to constipation and its management. Vomiting followed laxative and Powerade with Miralax. Nausea persists despite Zofran , which may worsen constipation. -  Advise  using Compazine  as first-line treatment for nausea to avoid constipation associated with Zofran .  Elevated alkaline phosphatase Slight elevation possibly related to constipation and decreased gut motility. Further evaluation needed. - Order abdominal ultrasound to assess liver and gallbladder function.  Fatigue and abnormal weight gain Persistent fatigue and unexplained weight gain. Fatigue may relate to weight gain, medication side effects, or underlying conditions. Weight gain noted despite low food intake, possibly due to medication or metabolic factors. - Monitor weight and energy levels. - Encourage reintroduction of mushroom coffee if it previously helped with energy levels.  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 No new neurological symptoms. MRI findings from 03/09/2024 at outside facility consistent with previous imaging. - Plan follow-up MRI in 3 to 6 months to monitor for changes.  Leg cramps Intermittent leg cramps managed with pickle juice. No need for further pain management referral. - Continue using pickle juice as needed for leg cramps.  RTC in 4 weeks for follow-up with repeat labs.  I reviewed lab results and outside records for this visit and discussed relevant results with the patient. Diagnosis, plan of care and treatment options were also discussed in detail with the patient. Opportunity provided to ask questions and answers provided to her apparent satisfaction. Provided instructions to call our clinic with any problems, questions or concerns prior to return visit. I recommended to continue follow-up with PCP and sub-specialists. She verbalized understanding and agreed with the plan.   NCCN guidelines have been consulted in the planning of this patient's care.  I spent a total of 42 minutes during this encounter with the patient including review of chart and various tests results, discussions about plan of  care and coordination of care plan.   Chinita Patten, MD  03/28/2024 9:39 AM   C[facility]ER CENTER [facility] - A DEPT OF [facility] [address] [place] [address] Dept: [phone] Dept Fax: [phone]    CHIEF COMPLAINT/ REASON FOR VISIT:   Stage II left breast cancer, ER positive, PR positive, HER2/neu negative, diagnosed in April 2024   Current Treatment: Adjuvant hormonal therapy with letrozole , started from February 2025.  Adjuvant Verzenio  started from 12/20/2023 with poor tolerance.  Dose reduced to 50 mg p.o. twice daily from 02/01/2024.  INTERVAL HISTORY:    Discussed the use of AI scribe software for clinical note transcription with the patient, who gave verbal consent to proceed.  History of Present Illness  [name] is a 38 year old female with a history of cancer who presents with abdominal pain and constipation.  She has been experiencing abdominal pain and constipation for the past week. The pain is described as a general discomfort throughout the abdomen, with a specific pressure on the side. Her abdomen is swollen and bloated, and she has not had a bowel movement despite multiple attempts. She has tried various remedies including pickle juice for leg cramps, Powerade with Miralax, and a clear liquid from [facility], but these have not been effective. She vomited three times on Sunday after taking these remedies.  She has a history of constipation, previously managed with mushroom coffee. However, she stopped the mushroom coffee after experiencing intolerance to Verzenio , which she has tried multiple times without success due to stomach cramping. She has not resumed the mushroom coffee since stopping Verzenio . Her energy levels have decreased, which she attributes to weight gain and possibly her cancer history.  She is currently taking Letrozole  daily and a prescription vitamin D  supplement, which she  sometimes forgets to take weekly. She has not been taking calcium  supplements. She avoids NSAIDs like ibuprofen  due to past gastrointestinal issues and concerns about exacerbating constipation.  No swelling, cough, or chest pain. Occasional indigestion and heartburn, and dizziness when taking blood pressure. She has been drinking water regularly and does not consume much juice or soda. She has not been using fiber supplements since stopping the mushroom coffee.   I have reviewed the past medical history, past surgical history, social history and family history with the patient and they are unchanged from previous note.  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  09/30/2022 diagnostic mammogram = 3.9 cm lesion upper outer quadrant lesion, a 1.5 cm upper outer quadrant lesion in the left breast + indeterminate axilla  10/02/2022 left breast biopsy and left axillary biopsy #1 mass/ 11 cm from nipple (3.9 cm) = invasive ductal carcinoma, grade 3, ER 99%, PR 97, HER2 2+ FISH negative #2 mass/ 8 cm from nipple (1.5cm)= invasive ductal carcinoma ER 99%, PR negative, HER2 2+ FISH negative Left axillary lymph node = invasive ductal carcinoma, grade 3 ER 99% PR 99% HER2 2+ FISH negative   10/28/2022- 03/16/2023 neoadjuvant chemotherapy with dose dense AC/weekly Taxol    04/14/2023 bilateral mastectomy with Dr. Kirt =ypT1bN1a: 2 separate primary 8 mm and 9 mm 1 out of 8 lymph node involved RCB II   06/01/23- 07/19/2023 adjuvant radiation therapy at [facility] with Dr Lauraine Golden: 50.4 Gy in 28 frx  08/03/2023 started adjuvant endocrine therapy with letrozole    Dr. Kelby was planning to add adjuvant abemaciclib .    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 In  addition, she was diagnosed before the age of 3. She meets criteria for genetic testing. She had telemedicine visit with the genetic counselor on 10/15/2022. She had a 71 [employer] gene panel. 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   A referral has been placed for her to see Dr. Arlee with gynecology oncology. The appointment was scheduled for February 04, 2023. The patient canceled and never rescheduled.   It should be noted that she is status post bilateral oophorectomy for teratomas. One ovary was removed at the age of 14 and another one was removed in 2022. It is not clear if the fallopian tubes were also removed during both surgeries.   Patient wanted to transfer care to our facility and hence she presented to establish with us  on 12/13/2023.  Plan made to continue letrozole .  Patient was agreeable to adding abemaciclib  at 150 mg p.o. twice daily, with planned duration of 2 years.    She started taking abemaciclib  from 12/20/2023.  Patient had abdominal cramping, nausea, vomiting that coincided with her diet which involved mushroom coffees.  Verzenio  was held briefly and dose reduced to 100 mg p.o. twice daily from 12/23/2023.  Patient continued to have some lower extremity pains, numbness, tingling, swelling and dose was reduced further from 02/01/2024 to 50 mg p.o. twice daily.  If persistent symptoms Verzenio  will be to be discontinued and we will continue letrozole  alone.  She had repeat CT of the head on 01/14/2024 which showed stable 3 mm nonspecific lesion in  the posterior pons, likely telangiectasia.   MRI Head W & W/o contrast 03/09/2024 showed small 3 mm enhancing lesion within the posterior/central aspect of the pons is nonspecific but suggestive of a capillary telangiectasia. Metastatic disease is not entirely excluded, however this is not favored. No other lesions. No acute intracranial abnormality.   Because of persistent GI side effects, arthralgias, myalgias, Verzenio  was discontinued and  letrozole  alone continued from September 2025.  Oncology History  Carcinoma of upper-outer quadrant of left breast in female, estrogen receptor positive (HCC)  05/04/2023 Cancer Staging   Staging form: Breast, AJCC 8th Edition - Pathologic stage from 05/04/2023: ypT1b, ypN1a(sn), cM0, G3, ER+, PR+, HER2- - Signed by Autumn Millman, MD on 12/13/2023 Stage prefix: Post-therapy Response to neoadjuvant therapy: Partial response Method of lymph node assessment: Sentinel lymph node biopsy Nuclear grade: G3 Multigene prognostic tests performed: Other Histologic grading system: 3 grade system Residual tumor (R): R0   05/11/2023 Initial Diagnosis   Malignant neoplasm of upper outer quadrant of female breast (HCC)       REVIEW OF SYSTEMS:   Review of Systems - Oncology  All other pertinent systems were reviewed with the patient and are negative.  ALLERGIES: She is allergic to hydrochlorothiazide, labetalol, and lisinopril .  MEDICATIONS:  Current Outpatient Medications  Medication Sig Dispense Refill   abemaciclib  (VERZENIO ) 50 MG tablet Take 1 tablet (50 mg total) by mouth 2 (two) times daily. 60 tablet 2   amLODipine  (NORVASC ) 10 MG tablet Take 10 mg by mouth daily.     atorvastatin  (LIPITOR) 40 MG tablet Take 40 mg by mouth daily.     Cholecalciferol 1.25 MG (50000 UT) capsule Take 1 capsule by mouth daily. Pt to pick up over-the-counter     diazepam  (VALIUM ) 2 MG tablet Take 2 mg by mouth 4 (four) times daily as needed.     dicyclomine  (BENTYL ) 10 MG capsule TAKE 1 CAPSULE (10 MG TOTAL) BY MOUTH 2 (TWO) TIMES DAILY AS NEEDED FOR SPASMS. 180 capsule 1   DUPIXENT 300 MG/2ML SOAJ Inject 300 mg into the skin every 14 (fourteen) days. Per pt report, can have every 2-4 weeks.     furosemide  (LASIX ) 20 MG tablet Take 1 tablet (20 mg total) by mouth daily as needed. 30 tablet 1   gabapentin  (NEURONTIN ) 300 MG capsule Take 600 mg by mouth at bedtime.     letrozole  (FEMARA ) 2.5 MG tablet Take  1 tablet (2.5 mg total) by mouth daily. 90 tablet 3   mirtazapine  (REMERON ) 30 MG tablet Take 1 tablet (30 mg total) by mouth at bedtime. 30 tablet 0   nortriptyline  (PAMELOR ) 50 MG capsule Take 150 mg by mouth at bedtime.     ondansetron  (ZOFRAN ) 8 MG tablet Take 1 tablet (8 mg total) by mouth every 8 (eight) hours as needed for nausea or vomiting. 30 tablet 3   pantoprazole  (PROTONIX ) 40 MG tablet Take 1 tablet (40 mg total) by mouth daily. 30 tablet 0   prochlorperazine  (COMPAZINE ) 10 MG tablet Take 1 tablet (10 mg total) by mouth every 6 (six) hours as needed for nausea or vomiting. 30 tablet 3   propranolol  ER (INDERAL  LA) 120 MG 24 hr capsule Take 1 capsule (120 mg total) by mouth daily. 30 capsule 0   triamcinolone cream (KENALOG) 0.1 % Apply 1 Application topically 2 (two) times daily.     valsartan (DIOVAN) 160 MG tablet Take 160 mg by mouth daily.     diazepam  (VALIUM ) 5 MG  tablet Take 1 tablet 30 minutes before scheduled MRI scan and may repeat another dose as needed. (Patient not taking: Reported on 03/28/2024) 2 tablet 0   No current facility-administered medications for this visit.     VITALS:   Blood pressure (!) 162/108, pulse 80, temperature (!) 97.1 F (36.2 C), temperature source Temporal, resp. rate 20, height 5' 5 (1.651 m), weight 184 lb 3.2 oz (83.6 kg), SpO2 100%.  Wt Readings from Last 3 Encounters:  03/28/24 184 lb 3.2 oz (83.6 kg)  02/01/24 178 lb 3.2 oz (80.8 kg)  12/23/23 171 lb 3.2 oz (77.7 kg)    Body mass index is 30.65 kg/m.    Onc Performance Status - 03/28/24 0849       ECOG Perf Status   ECOG Perf Status Ambulatory and capable of all selfcare but unable to carry out any work activities.  Up and about more than 50% of waking hours      KPS SCALE   KPS % SCORE Cares for self, unable to carry on normal activity or to do active work          PHYSICAL EXAM:   Physical Exam Constitutional:      General: She is not in acute distress.     Appearance: Normal appearance.  HENT:     Head: Normocephalic and atraumatic.  Cardiovascular:     Rate and Rhythm: Normal rate.  Pulmonary:     Effort: Pulmonary effort is normal. No respiratory distress.  Abdominal:     General: There is no distension.  Neurological:     General: No focal deficit present.     Mental Status: She is alert and oriented to person, place, and time.  Psychiatric:        Mood and Affect: Mood is anxious. Affect is labile.     Comments: Intermittently tearful during the interview       LABORATORY DATA:   I have reviewed the data as listed.  Results for orders placed or performed in visit on 03/28/24  Magnesium   Result Value Ref Range   Magnesium  2.2 1.7 - 2.4 mg/dL  CMP (Cancer Center only)  Result Value Ref Range   Sodium 140 135 - 145 mmol/L   Potassium 3.6 3.5 - 5.1 mmol/L   Chloride 102 98 - 111 mmol/L   CO2 26 22 - 32 mmol/L   Glucose, Bld 90 70 - 99 mg/dL   BUN 13 6 - 20 mg/dL   Creatinine 9.09 9.55 - 1.00 mg/dL   Calcium  10.6 (H) 8.9 - 10.3 mg/dL   Total Protein 7.8 6.5 - 8.1 g/dL   Albumin 4.8 3.5 - 5.0 g/dL   AST 29 15 - 41 U/L   ALT 21 0 - 44 U/L   Alkaline Phosphatase 164 (H) 38 - 126 U/L   Total Bilirubin 0.4 0.0 - 1.2 mg/dL   GFR, Estimated >39 >39 mL/min   Anion gap 12 5 - 15  CBC with Differential (Cancer Center Only)  Result Value Ref Range   WBC Count 7.2 4.0 - 10.5 K/uL   RBC 5.30 (H) 3.87 - 5.11 MIL/uL   Hemoglobin 13.7 12.0 - 15.0 g/dL   HCT 57.9 63.9 - 53.9 %   MCV 79.2 (L) 80.0 - 100.0 fL   MCH 25.8 (L) 26.0 - 34.0 pg   MCHC 32.6 30.0 - 36.0 g/dL   RDW 85.5 88.4 - 84.4 %   Platelet Count 237 150 - 400 K/uL  nRBC 0.0 0.0 - 0.2 %   Neutrophils Relative % 71 %   Neutro Abs 5.1 1.7 - 7.7 K/uL   Lymphocytes Relative 21 %   Lymphs Abs 1.5 0.7 - 4.0 K/uL   Monocytes Relative 7 %   Monocytes Absolute 0.5 0.1 - 1.0 K/uL   Eosinophils Relative 1 %   Eosinophils Absolute 0.1 0.0 - 0.5 K/uL   Basophils Relative 0  %   Basophils Absolute 0.0 0.0 - 0.1 K/uL   Immature Granulocytes 0 %   Abs Immature Granulocytes 0.02 0.00 - 0.07 K/uL      RADIOGRAPHIC STUDIES:  I reviewed CT head report from [facility] on 01/14/2024 which showed stable 3 mm telangiectasia in the posterior pons.  MRI Head W & W/o contrast 03/09/2024 showed small 3 mm enhancing lesion within the posterior/central aspect of the pons is nonspecific but suggestive of a capillary telangiectasia. Metastatic disease is not entirely excluded, however this is not favored. No other lesions. No acute intracranial abnormality.   CODE STATUS:  Code Status History     Date Active Date Inactive Code Status Order ID Comments User Context   08/17/2023 2340 08/21/2023 1726 Full Code [id number]  Randall Starlyn HERO, NP Inpatient   08/17/2023 2340 08/17/2023 2340 Full Code [id number]  Randall Starlyn HERO, NP Inpatient   08/17/2023 0418 08/17/2023 2249 Full Code [id number]  Alan Thurman GAILS, NP ED   04/25/2021 1052 04/25/2021 2204 Full Code [id number]  Schermerhorn, Debby PARAS, MD Inpatient   10/18/2013 2030 10/19/2013 1915 Full Code [id number]  Verta Blossom, CNM Inpatient   07/02/2012 2101 07/04/2012 1534 Full Code [id number]  Shoptaw, Benedict Helling, RN Inpatient    Questions for Most Recent Historical Code Status (Order [id number])     Question Answer   By: Consent: discussion documented in EHR            Orders Placed This Encounter  Procedures   US  Abdomen Complete    Standing Status:   Future    Expected Date:   03/29/2024    Expiration Date:   03/28/2025    Scheduling Instructions:     Please schedule at a [facility] facility as per her insurance.           Please fax results to [phone], attention Dr. Autumn.    Reason for Exam (SYMPTOM  OR DIAGNOSIS REQUIRED):   Abdominal pain, bloating, elevated alkaline phosphatase in a patient with breast cancer. Needs further evaluation.    Preferred imaging location?:   External   CBC with Differential  (Cancer Center Only)    Standing Status:   Future    Expiration Date:   03/28/2025   CMP (Cancer Center only)    Standing Status:   Future    Expiration Date:   03/28/2025     Future Appointments  Date Time Provider Department Center  05/03/2024  8:00 AM [facility] None  05/03/2024  8:30 AM Mishika Flippen, Chinita, MD [facility] None     This document was completed utilizing speech recognition software. Grammatical errors, random word insertions, pronoun errors, and incomplete sentences are an occasional consequence of this system due to software limitations, ambient noise, and hardware issues. Any formal questions or concerns about the content, text or information contained within the body of this dictation should be directly addressed to the provider for clarification.

## 2024-04-04 ENCOUNTER — Encounter: Payer: Self-pay | Admitting: Oncology

## 2024-04-14 ENCOUNTER — Telehealth: Payer: Self-pay | Admitting: Oncology

## 2024-04-14 NOTE — Telephone Encounter (Signed)
PT called to schedule appt

## 2024-04-20 ENCOUNTER — Other Ambulatory Visit: Payer: Self-pay

## 2024-04-25 ENCOUNTER — Other Ambulatory Visit: Payer: Self-pay

## 2024-04-26 ENCOUNTER — Encounter: Payer: Self-pay | Admitting: Oncology

## 2024-04-26 NOTE — Assessment & Plan Note (Signed)
 Please review oncology history for additional details and timeline of events.  She was originally diagnosed in April 2024.  ER positive, PR positive, HER2/neu negative by FISH.  10/28/2022- 03/16/2023 neoadjuvant chemotherapy with dose dense AC/weekly Taxol   04/14/2023 bilateral mastectomy with Dr. Kirt =ypT1bN1a: 2 separate primary 8 mm and 9 mm 1 out of 8 lymph node involved RCB II  06/01/2019 24-2 07/19/2023 adjuvant radiation therapy at [facility] with Dr Lauraine Golden: 50.4 Gy in 28 frx 08/03/2023 started adjuvant endocrine therapy with letrozole .   Previously Dr. Kelby proposed adding adjuvant Abemaciclib  but patient was undecided at that time.  Reviewed clinical trial data of MonarchE study.  Given overall benefit in disease-free survival and recurrence risk, patient was agreeable to proceeding with abemaciclib .  Plan is to start at 150 mg p.o. twice daily dosing for planned duration of 2 years.  Discussed side effect profile including leukopenia/neutropenia, diarrhea, fatigue, nausea Septra.  Patient verbalized understanding.  Prescription sent to specialty pharmacy.  CT of the head from March 2025 which showed 3 mm nonspecific lesion in the posterior pons.  MRI could not be obtained as patient is undergoing breast reconstruction with expanders.  She started taking abemaciclib  from 12/20/2023.  Patient had abdominal cramping, nausea, vomiting that coincided with her diet which involved mushroom coffees.  Verzenio  was held briefly and dose reduced to 100 mg p.o. twice daily from 12/23/2023.  Patient continued to have some lower extremity pains, numbness, tingling, swelling and dose was reduced further from 02/01/2024 to 50 mg p.o. twice daily.  If persistent symptoms Verzenio  will be to be discontinued and we will continue letrozole  alone.  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  Will obtain MRI of the brain  for further evaluation, now that she does not have breast tissue expanders.  Because of persistent GI side effects, arthralgias, myalgias, Verzenio  was discontinued and letrozole  alone continued from September 2025.  She is tolerating letrozole  better.

## 2024-04-27 ENCOUNTER — Other Ambulatory Visit: Payer: Self-pay

## 2024-05-02 ENCOUNTER — Telehealth: Payer: Self-pay

## 2024-05-02 NOTE — Telephone Encounter (Signed)
 Spoke with patient via phone to remind her that she has an 0800 appointment at [facility] instead of at [facility]. Patient was grateful as she had forgotten and would have come to [facility].

## 2024-05-03 ENCOUNTER — Inpatient Hospital Stay: Attending: Oncology

## 2024-05-03 ENCOUNTER — Inpatient Hospital Stay: Admitting: Oncology

## 2024-05-03 VITALS — BP 133/96 | HR 74 | Temp 98.0°F | Resp 18 | Ht 65.0 in | Wt 188.3 lb

## 2024-05-03 DIAGNOSIS — C50412 Malignant neoplasm of upper-outer quadrant of left female breast: Secondary | ICD-10-CM | POA: Diagnosis not present

## 2024-05-03 DIAGNOSIS — R252 Cramp and spasm: Secondary | ICD-10-CM | POA: Insufficient documentation

## 2024-05-03 DIAGNOSIS — Z170 Estrogen receptor positive status [ER+]: Secondary | ICD-10-CM | POA: Diagnosis not present

## 2024-05-03 DIAGNOSIS — Z9013 Acquired absence of bilateral breasts and nipples: Secondary | ICD-10-CM | POA: Insufficient documentation

## 2024-05-03 DIAGNOSIS — I781 Nevus, non-neoplastic: Secondary | ICD-10-CM | POA: Diagnosis not present

## 2024-05-03 DIAGNOSIS — R5383 Other fatigue: Secondary | ICD-10-CM | POA: Diagnosis not present

## 2024-05-03 DIAGNOSIS — K429 Umbilical hernia without obstruction or gangrene: Secondary | ICD-10-CM | POA: Insufficient documentation

## 2024-05-03 DIAGNOSIS — Z923 Personal history of irradiation: Secondary | ICD-10-CM | POA: Insufficient documentation

## 2024-05-03 DIAGNOSIS — L439 Lichen planus, unspecified: Secondary | ICD-10-CM | POA: Diagnosis not present

## 2024-05-03 DIAGNOSIS — C773 Secondary and unspecified malignant neoplasm of axilla and upper limb lymph nodes: Secondary | ICD-10-CM | POA: Diagnosis present

## 2024-05-03 DIAGNOSIS — Z79811 Long term (current) use of aromatase inhibitors: Secondary | ICD-10-CM | POA: Insufficient documentation

## 2024-05-03 DIAGNOSIS — F5104 Psychophysiologic insomnia: Secondary | ICD-10-CM | POA: Insufficient documentation

## 2024-05-03 DIAGNOSIS — Z9221 Personal history of antineoplastic chemotherapy: Secondary | ICD-10-CM | POA: Diagnosis not present

## 2024-05-03 DIAGNOSIS — N200 Calculus of kidney: Secondary | ICD-10-CM | POA: Diagnosis not present

## 2024-05-03 LAB — CBC WITH DIFFERENTIAL (CANCER CENTER ONLY)
Abs Immature Granulocytes: 0.02 K/uL (ref 0.00–0.07)
Basophils Absolute: 0.1 K/uL (ref 0.0–0.1)
Basophils Relative: 1 %
Eosinophils Absolute: 0.1 K/uL (ref 0.0–0.5)
Eosinophils Relative: 1 %
HCT: 41 % (ref 36.0–46.0)
Hemoglobin: 13.5 g/dL (ref 12.0–15.0)
Immature Granulocytes: 0 %
Lymphocytes Relative: 23 %
Lymphs Abs: 1.5 K/uL (ref 0.7–4.0)
MCH: 25.7 pg — ABNORMAL LOW (ref 26.0–34.0)
MCHC: 32.9 g/dL (ref 30.0–36.0)
MCV: 77.9 fL — ABNORMAL LOW (ref 80.0–100.0)
Monocytes Absolute: 0.4 K/uL (ref 0.1–1.0)
Monocytes Relative: 7 %
Neutro Abs: 4.4 K/uL (ref 1.7–7.7)
Neutrophils Relative %: 68 %
Platelet Count: 257 K/uL (ref 150–400)
RBC: 5.26 MIL/uL — ABNORMAL HIGH (ref 3.87–5.11)
RDW: 13.9 % (ref 11.5–15.5)
WBC Count: 6.4 K/uL (ref 4.0–10.5)
nRBC: 0 % (ref 0.0–0.2)

## 2024-05-03 LAB — CMP (CANCER CENTER ONLY)
ALT: 14 U/L (ref 0–44)
AST: 21 U/L (ref 15–41)
Albumin: 4.7 g/dL (ref 3.5–5.0)
Alkaline Phosphatase: 143 U/L — ABNORMAL HIGH (ref 38–126)
Anion gap: 12 (ref 5–15)
BUN: 20 mg/dL (ref 6–20)
CO2: 25 mmol/L (ref 22–32)
Calcium: 10.1 mg/dL (ref 8.9–10.3)
Chloride: 101 mmol/L (ref 98–111)
Creatinine: 1.05 mg/dL — ABNORMAL HIGH (ref 0.44–1.00)
GFR, Estimated: >60 mL/min (ref >60)
Glucose, Bld: 100 mg/dL — ABNORMAL HIGH (ref 70–99)
Potassium: 4.2 mmol/L (ref 3.5–5.1)
Sodium: 138 mmol/L (ref 135–145)
Total Bilirubin: 0.5 mg/dL (ref 0.0–1.2)
Total Protein: 7.6 g/dL (ref 6.5–8.1)

## 2024-05-03 NOTE — Assessment & Plan Note (Addendum)
 Please review oncology history for additional details and timeline of events.  She was originally diagnosed in April 2024.  ER positive, PR positive, HER2/neu negative by FISH.  10/28/2022- 03/16/2023 neoadjuvant chemotherapy with dose dense AC/weekly Taxol   04/14/2023 bilateral mastectomy with Dr. Kirt =ypT1bN1a: 2 separate primary 8 mm and 9 mm 1 out of 8 lymph node involved RCB II  06/01/2019 24-2 07/19/2023 adjuvant radiation therapy at [facility] with Dr Lauraine Golden: 50.4 Gy in 28 frx 08/03/2023 started adjuvant endocrine therapy with letrozole .   Previously Dr. Kelby proposed adding adjuvant Abemaciclib  but patient was undecided at that time.  Reviewed clinical trial data of MonarchE study.  Given overall benefit in disease-free survival and recurrence risk, patient was agreeable to proceeding with abemaciclib .  Plan is to start at 150 mg p.o. twice daily dosing for planned duration of 2 years.  Discussed side effect profile including leukopenia/neutropenia, diarrhea, fatigue, nausea Septra.  Patient verbalized understanding.  Prescription sent to specialty pharmacy.  CT of the head from March 2025 which showed 3 mm nonspecific lesion in the posterior pons.  MRI could not be obtained as patient is undergoing breast reconstruction with expanders.  She started taking abemaciclib  from 12/20/2023.  Patient had abdominal cramping, nausea, vomiting that coincided with her diet which involved mushroom coffees.  Verzenio  was held briefly and dose reduced to 100 mg p.o. twice daily from 12/23/2023.  Patient continued to have some lower extremity pains, numbness, tingling, swelling and dose was reduced further from 02/01/2024 to 50 mg p.o. twice daily.  If persistent symptoms Verzenio  will be to be discontinued and we will continue letrozole  alone.  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  Stable compared to prior.  Plan to repeat MRI in approximately 3 to 6 months  Because of persistent GI side effects, arthralgias, myalgias, Verzenio  was discontinued and letrozole  alone continued from September 2025.  She is tolerating letrozole  better.  Blood work shows normal white blood cells, hemoglobin, platelets, kidney, and liver function, with alkaline phosphatase levels improved but slightly elevated. - Continue letrozole  maintenance therapy - Will repeat MRI in six months - Scheduled follow-up appointment in two months at [facility].

## 2024-05-03 NOTE — Progress Notes (Signed)
 "  [facility]  ONCOLOGY CLINIC PROGRESS NOTE   Patient Care Team: Emilio Joesph VEAR DEVONNA as PCP - General (Physician Assistant)  PATIENT NAME: [name]   MR#: [id number] DOB: 07/27/1985  Date of visit: 05/03/2024   ASSESSMENT & PLAN:   CHEKESHA BEHLKE is a 38 y.o.  lady with a past medical history of anxiety/depression, panic attacks, hypertension, dyslipidemia, was diagnosed with stage II left breast cancer, ER positive, PR positive, HER2/neu negative, diagnosed in April 2024.  Previously she was seen at [facility] by Dr. Kelby.  Patient wanted to transfer her care to our facility and hence she was referred to our clinic.  She established with our clinic in June 2025.  Carcinoma of upper-outer quadrant of left breast in female, estrogen receptor positive (HCC) Please review oncology history for additional details and timeline of events.  She was originally diagnosed in April 2024.  ER positive, PR positive, HER2/neu negative by FISH.  10/28/2022- 03/16/2023 neoadjuvant chemotherapy with dose dense AC/weekly Taxol   04/14/2023 bilateral mastectomy with Dr. Kirt =ypT1bN1a: 2 separate primary 8 mm and 9 mm 1 out of 8 lymph node involved RCB II  06/01/2019 24-2 07/19/2023 adjuvant radiation therapy at [facility] with Dr Lauraine Golden: 50.4 Gy in 28 frx 08/03/2023 started adjuvant endocrine therapy with letrozole .   Previously Dr. Kelby proposed adding adjuvant Abemaciclib  but patient was undecided at that time.  Reviewed clinical trial data of MonarchE study.  Given overall benefit in disease-free survival and recurrence risk, patient was agreeable to proceeding with abemaciclib .  Plan is to start at 150 mg p.o. twice daily dosing for planned duration of 2 years.  Discussed side effect profile including leukopenia/neutropenia, diarrhea, fatigue, nausea Septra.  Patient verbalized understanding.  Prescription sent to specialty pharmacy.  CT of the head from March 2025  which showed 3 mm nonspecific lesion in the posterior pons.  MRI could not be obtained as patient is undergoing breast reconstruction with expanders.  She started taking abemaciclib  from 12/20/2023.  Patient had abdominal cramping, nausea, vomiting that coincided with her diet which involved mushroom coffees.  Verzenio  was held briefly and dose reduced to 100 mg p.o. twice daily from 12/23/2023.  Patient continued to have some lower extremity pains, numbness, tingling, swelling and dose was reduced further from 02/01/2024 to 50 mg p.o. twice daily.  If persistent symptoms Verzenio  will be to be discontinued and we will continue letrozole  alone.  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  Stable compared to prior.  Plan to repeat MRI in approximately 3 to 6 months  Because of persistent GI side effects, arthralgias, myalgias, Verzenio  was discontinued and letrozole  alone continued from September 2025.  She is tolerating letrozole  better.  Blood work shows normal white blood cells, hemoglobin, platelets, kidney, and liver function, with alkaline phosphatase levels improved but slightly elevated. - Continue letrozole  maintenance therapy - Will repeat MRI in six months - Scheduled follow-up appointment in two months at [facility].  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 Dermatologist has not yet performed a biopsy to confirm diagnosis. - Continue follow-up with dermatologist for potential biopsy and further management  Nephrolithiasis, left kidney, stable without obstruction Kidney stones in  the left kidney identified on ultrasound, but no obstruction or symptoms such as back pain.  Alkaline phosphatase levels have improved. - Encouraged hydration to prevent stone formation  Umbilical hernia, stable Umbilical hernia noted on ultrasound, but no acute issues or symptoms reported.  Fatigue, multifactorial Persistent fatigue despite adequate hydration and nutrition. Exercise is performed a couple of times a week, but not at recommended frequency. Sleep disturbances may contribute to fatigue. - Encouraged increased exercise frequency to at least two to three times a week - Address sleep disturbances as potential contributing factor  Insomnia, chronic Chronic insomnia likely exacerbated by environmental factors such as being a light sleeper and household noise. Not believed to be related to cancer.  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 No new neurological symptoms. MRI findings from 03/09/2024 at outside facility consistent with previous imaging. - Plan follow-up MRI in 3 to 6 months to monitor for changes.  Leg cramps Intermittent leg cramps managed with pickle juice. No need for further pain management referral. - Continue using pickle juice as needed for leg cramps.   I reviewed lab results and outside records for this visit and discussed relevant results with the patient. Diagnosis, plan of care and treatment options were also discussed in detail with the patient. Opportunity provided to ask questions and answers provided to her apparent satisfaction. Provided instructions to call our clinic with any problems, questions or concerns prior to return visit. I recommended to continue follow-up with PCP and sub-specialists. She verbalized understanding and agreed with the plan.   NCCN guidelines have been consulted in the planning of this patients care.  I spent a total of 35 minutes during this encounter with the patient including review of chart and various tests results, discussions about plan of care and  coordination of care plan.   Chinita Patten, MD  05/03/2024 9:03 AM  [facility] - A DEPT OF [facility] [employer] [address] [place] [address] Dept: [phone] Dept Fax: [phone]    CHIEF COMPLAINT/ REASON FOR VISIT:   Stage II left breast cancer, ER positive, PR positive, HER2/neu negative, diagnosed in April 2024   Current Treatment: Adjuvant hormonal therapy with letrozole , started from February 2025.  Adjuvant Verzenio  started from 12/20/2023 with poor tolerance.  Dose reduced to 50 mg p.o. twice daily from 02/01/2024.  INTERVAL HISTORY:    Discussed the use of AI scribe software for clinical note transcription with the patient, who gave verbal consent to proceed.  History of Present Illness  CHESNEE FLOREN is a 38 year old female with breast cancer who presents for follow-up regarding her treatment and symptoms.  She discontinued Verzenio  in September 2025 due to severe stomach cramping and nausea, despite using Compazine  and Zofran . She is currently on letrozole  and reports no nausea or vomiting. An MRI in September was normal, and recent blood work shows normal white blood cell count, hemoglobin, and platelets. Her alkaline phosphatase level has improved but remains slightly elevated. An abdominal ultrasound revealed kidney stones on the left side without blockage and an umbilical hernia.  She experiences persistent fatigue, feeling tired throughout the day despite staying hydrated and maintaining a balanced diet. Her diet includes egg whites, spinach, peppers, and other greens, and she avoids fast food. She exercises on a stationary bike a couple of times a week. She reports poor sleep quality, attributing it to being a  light sleeper and the only adult at home during the night.  Since starting mushroom coffee, her bowel movements have become regular, occurring daily, and she has not experienced stomach pain  for the past three weeks. However, she continues to experience bloating.  She experienced leg cramps described as 'Charlie horses' but has not had them recently and has not needed to drink pickle juice for relief in the past few weeks.  She has a skin condition on both ankles and is using triamcinolone cream without significant relief.   I have reviewed the past medical history, past surgical history, social history and family history with the patient and they are unchanged from previous note.  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  09/30/2022 diagnostic mammogram = 3.9 cm lesion upper outer quadrant lesion, a 1.5 cm upper outer quadrant lesion in the left breast + indeterminate axilla  10/02/2022 left breast biopsy and left axillary biopsy #1 mass/ 11 cm from nipple (3.9 cm) = invasive ductal carcinoma, grade 3, ER 99%, PR 97, HER2 2+ FISH negative #2 mass/ 8 cm from nipple (1.5cm)= invasive ductal carcinoma ER 99%, PR negative, HER2 2+ FISH negative Left axillary lymph node = invasive ductal carcinoma, grade 3 ER 99% PR 99% HER2 2+ FISH negative   10/28/2022- 03/16/2023 neoadjuvant chemotherapy with dose dense AC/weekly Taxol    04/14/2023 bilateral mastectomy with Dr. Kirt =ypT1bN1a: 2 separate primary 8 mm and 9 mm 1 out of 8 lymph node involved RCB II   06/01/23- 07/19/2023 adjuvant radiation therapy at [facility] with Dr Lauraine Golden: 50.4 Gy in 28 frx  08/03/2023 started adjuvant endocrine therapy with letrozole    Dr. Kelby was planning to add adjuvant abemaciclib .    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 In addition, she was diagnosed before the age of 32. She meets criteria for genetic testing. She had telemedicine visit with the  genetic counselor on 10/15/2022. She had a 71 Ambry gene panel. 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   A referral has been placed for her to see Dr. Arlee with gynecology oncology. The appointment was scheduled for February 04, 2023. The patient canceled and never rescheduled.   It should be noted that she is status post bilateral oophorectomy for teratomas. One ovary was removed at the age of 57 and another one was removed in 2022. It is not clear if the fallopian tubes were also removed during both surgeries.   Patient wanted to transfer care to our facility and hence she presented to establish with us  on 12/13/2023.  Plan made to continue letrozole .  Patient was agreeable to adding abemaciclib  at 150 mg p.o. twice daily, with planned duration of 2 years.    She started taking abemaciclib  from 12/20/2023.  Patient had abdominal cramping, nausea, vomiting that coincided with her diet which involved mushroom coffees.  Verzenio  was held briefly and dose reduced to 100 mg p.o. twice daily from 12/23/2023.  Patient continued to have some lower extremity pains, numbness, tingling, swelling and dose was reduced further from 02/01/2024 to 50 mg p.o. twice daily.  If persistent symptoms Verzenio  will be to be discontinued and we will continue letrozole  alone.  She had repeat CT of the head on 01/14/2024 which showed stable  3 mm nonspecific lesion in the posterior pons, likely telangiectasia.   MRI Head W & W/o contrast 03/09/2024 showed small 3 mm enhancing lesion within the posterior/central aspect of the pons is nonspecific but suggestive of a capillary telangiectasia. Metastatic disease is not entirely excluded, however this is not favored. No other lesions. No acute intracranial abnormality.   Because of persistent GI side effects, arthralgias, myalgias, Verzenio  was discontinued and letrozole  alone continued from September 2025.  Oncology History  Carcinoma of upper-outer quadrant of left breast in  female, estrogen receptor positive (HCC)  05/04/2023 Cancer Staging   Staging form: Breast, AJCC 8th Edition - Pathologic stage from 05/04/2023: ypT1b, ypN1a(sn), cM0, G3, ER+, PR+, HER2- - Signed by Autumn Millman, MD on 12/13/2023 Stage prefix: Post-therapy Response to neoadjuvant therapy: Partial response Method of lymph node assessment: Sentinel lymph node biopsy Nuclear grade: G3 Multigene prognostic tests performed: Other Histologic grading system: 3 grade system Residual tumor (R): R0   05/11/2023 Initial Diagnosis   Malignant neoplasm of upper outer quadrant of female breast (HCC)       REVIEW OF SYSTEMS:   Review of Systems - Oncology  All other pertinent systems were reviewed with the patient and are negative.  ALLERGIES: She is allergic to hydrochlorothiazide, labetalol, and lisinopril .  MEDICATIONS:  Current Outpatient Medications  Medication Sig Dispense Refill   abemaciclib  (VERZENIO ) 50 MG tablet Take 1 tablet (50 mg total) by mouth 2 (two) times daily. 60 tablet 2   amLODipine  (NORVASC ) 10 MG tablet Take 10 mg by mouth daily.     atorvastatin  (LIPITOR) 40 MG tablet Take 40 mg by mouth daily.     Cholecalciferol 1.25 MG (50000 UT) capsule Take 1 capsule by mouth daily. Pt to pick up over-the-counter     diazepam  (VALIUM ) 5 MG tablet Take 1 tablet 30 minutes before scheduled MRI scan and may repeat another dose as needed. 2 tablet 0   dicyclomine  (BENTYL ) 10 MG capsule TAKE 1 CAPSULE (10 MG TOTAL) BY MOUTH 2 (TWO) TIMES DAILY AS NEEDED FOR SPASMS. 180 capsule 1   DUPIXENT 300 MG/2ML SOAJ Inject 300 mg into the skin every 14 (fourteen) days. Per pt report, can have every 2-4 weeks.     furosemide  (LASIX ) 20 MG tablet Take 1 tablet (20 mg total) by mouth daily as needed. 30 tablet 1   gabapentin  (NEURONTIN ) 300 MG capsule Take 600 mg by mouth at bedtime.     letrozole  (FEMARA ) 2.5 MG tablet Take 1 tablet (2.5 mg total) by mouth daily. 90 tablet 3   mirtazapine   (REMERON ) 30 MG tablet Take 1 tablet (30 mg total) by mouth at bedtime. 30 tablet 0   nortriptyline  (PAMELOR ) 50 MG capsule Take 150 mg by mouth at bedtime.     ondansetron  (ZOFRAN ) 8 MG tablet Take 1 tablet (8 mg total) by mouth every 8 (eight) hours as needed for nausea or vomiting. 30 tablet 3   pantoprazole  (PROTONIX ) 40 MG tablet Take 1 tablet (40 mg total) by mouth daily. 30 tablet 0   prochlorperazine  (COMPAZINE ) 10 MG tablet Take 1 tablet (10 mg total) by mouth every 6 (six) hours as needed for nausea or vomiting. 30 tablet 3   propranolol  ER (INDERAL  LA) 120 MG 24 hr capsule Take 1 capsule (120 mg total) by mouth daily. 30 capsule 0   triamcinolone cream (KENALOG) 0.1 % Apply 1 Application topically 2 (two) times daily.     valsartan (DIOVAN) 160 MG tablet Take 160 mg  by mouth daily.     diazepam  (VALIUM ) 2 MG tablet Take 2 mg by mouth 4 (four) times daily as needed. (Patient not taking: Reported on 05/03/2024)     No current facility-administered medications for this visit.     VITALS:   Blood pressure (!) 133/96, pulse 74, temperature 98 F (36.7 C), temperature source Temporal, resp. rate 18, height 5' 5 (1.651 m), weight 188 lb 4.8 oz (85.4 kg), SpO2 98%.  Wt Readings from Last 3 Encounters:  05/03/24 188 lb 4.8 oz (85.4 kg)  03/28/24 184 lb 3.2 oz (83.6 kg)  02/01/24 178 lb 3.2 oz (80.8 kg)    Body mass index is 31.33 kg/m.    Onc Performance Status - 05/03/24 0853       ECOG Perf Status   ECOG Perf Status Ambulatory and capable of all selfcare but unable to carry out any work activities.  Up and about more than 50% of waking hours      KPS SCALE   KPS % SCORE Normal activity with effort, some s/s of disease          PHYSICAL EXAM:   Physical Exam Constitutional:      General: She is not in acute distress.    Appearance: Normal appearance.  HENT:     Head: Normocephalic and atraumatic.  Cardiovascular:     Rate and Rhythm: Normal rate.  Pulmonary:      Effort: Pulmonary effort is normal. No respiratory distress.  Abdominal:     General: There is no distension.  Neurological:     General: No focal deficit present.     Mental Status: She is alert and oriented to person, place, and time.  Psychiatric:        Mood and Affect: Mood is anxious. Affect is labile.     Comments: Intermittently tearful during the interview       LABORATORY DATA:   I have reviewed the data as listed.  Results for orders placed or performed in visit on 05/03/24  CBC with Differential (Cancer Center Only)  Result Value Ref Range   WBC Count 6.4 4.0 - 10.5 K/uL   RBC 5.26 (H) 3.87 - 5.11 MIL/uL   Hemoglobin 13.5 12.0 - 15.0 g/dL   HCT 58.9 63.9 - 53.9 %   MCV 77.9 (L) 80.0 - 100.0 fL   MCH 25.7 (L) 26.0 - 34.0 pg   MCHC 32.9 30.0 - 36.0 g/dL   RDW 86.0 88.4 - 84.4 %   Platelet Count 257 150 - 400 K/uL   nRBC 0.0 0.0 - 0.2 %   Neutrophils Relative % 68 %   Neutro Abs 4.4 1.7 - 7.7 K/uL   Lymphocytes Relative 23 %   Lymphs Abs 1.5 0.7 - 4.0 K/uL   Monocytes Relative 7 %   Monocytes Absolute 0.4 0.1 - 1.0 K/uL   Eosinophils Relative 1 %   Eosinophils Absolute 0.1 0.0 - 0.5 K/uL   Basophils Relative 1 %   Basophils Absolute 0.1 0.0 - 0.1 K/uL   Immature Granulocytes 0 %   Abs Immature Granulocytes 0.02 0.00 - 0.07 K/uL  CMP (Cancer Center only)  Result Value Ref Range   Sodium 138 135 - 145 mmol/L   Potassium 4.2 3.5 - 5.1 mmol/L   Chloride 101 98 - 111 mmol/L   CO2 25 22 - 32 mmol/L   Glucose, Bld 100 (H) 70 - 99 mg/dL   BUN 20 6 - 20 mg/dL  Creatinine 1.05 (H) 0.44 - 1.00 mg/dL   Calcium  10.1 8.9 - 10.3 mg/dL   Total Protein 7.6 6.5 - 8.1 g/dL   Albumin 4.7 3.5 - 5.0 g/dL   AST 21 15 - 41 U/L   ALT 14 0 - 44 U/L   Alkaline Phosphatase 143 (H) 38 - 126 U/L   Total Bilirubin 0.5 0.0 - 1.2 mg/dL   GFR, Estimated >39 >39 mL/min   Anion gap 12 5 - 15      RADIOGRAPHIC STUDIES:  I reviewed CT head report from [facility] on 01/14/2024  which showed stable 3 mm telangiectasia in the posterior pons.  MRI Head W & W/o contrast 03/09/2024 showed small 3 mm enhancing lesion within the posterior/central aspect of the pons is nonspecific but suggestive of a capillary telangiectasia. Metastatic disease is not entirely excluded, however this is not favored. No other lesions. No acute intracranial abnormality.   CODE STATUS:  Code Status History     Date Active Date Inactive Code Status Order ID Comments User Context   08/17/2023 2340 08/21/2023 1726 Full Code [id number]  Randall Starlyn HERO, NP Inpatient   08/17/2023 2340 08/17/2023 2340 Full Code [id number]  Randall Starlyn HERO, NP Inpatient   08/17/2023 0418 08/17/2023 2249 Full Code [id number]  Alan Thurman GAILS, NP ED   04/25/2021 1052 04/25/2021 2204 Full Code [id number]  Schermerhorn, Debby PARAS, MD Inpatient   10/18/2013 2030 10/19/2013 1915 Full Code [id number]  Verta Blossom, CNM Inpatient   07/02/2012 2101 07/04/2012 1534 Full Code [id number]  Shoptaw, Benedict Helling, RN Inpatient    Questions for Most Recent Historical Code Status (Order [id number])     Question Answer   By: Consent: discussion documented in EHR            Orders Placed This Encounter  Procedures   CBC with Differential (Cancer Center Only)    Standing Status:   Future    Expiration Date:   06/05/2025   CMP (Cancer Center only)    Standing Status:   Future    Expiration Date:   06/05/2025     This document was completed utilizing speech recognition software. Grammatical errors, random word insertions, pronoun errors, and incomplete sentences are an occasional consequence of this system due to software limitations, ambient noise, and hardware issues. Any formal questions or concerns about the content, text or information contained within the body of this dictation should be directly addressed to the provider for clarification.   "

## 2024-05-09 ENCOUNTER — Encounter: Payer: Self-pay | Admitting: Oncology

## 2024-05-15 ENCOUNTER — Other Ambulatory Visit: Payer: Self-pay

## 2024-05-15 NOTE — Progress Notes (Signed)
 Per OV on 11/19, Verzenio  was discontinued secondary to GI side effects, arthralgias, and myalgias. Disenrolled

## 2024-06-05 ENCOUNTER — Encounter: Payer: Self-pay | Admitting: Oncology

## 2024-06-09 ENCOUNTER — Other Ambulatory Visit: Payer: Self-pay

## 2024-06-09 ENCOUNTER — Encounter: Payer: Self-pay | Admitting: Oncology

## 2024-06-09 MED ORDER — ONDANSETRON HCL 8 MG PO TABS: 8.0000 mg | ORAL_TABLET | Freq: Three times a day (TID) | ORAL | 3 refills | Status: AC | PRN

## 2024-06-09 MED ORDER — PROCHLORPERAZINE MALEATE 10 MG PO TABS: 10.0000 mg | ORAL_TABLET | Freq: Four times a day (QID) | ORAL | 3 refills | Status: AC | PRN

## 2024-06-12 ENCOUNTER — Encounter: Payer: Self-pay | Admitting: Oncology

## 2024-06-18 ENCOUNTER — Encounter: Payer: Self-pay | Admitting: Oncology

## 2024-06-22 ENCOUNTER — Telehealth: Payer: Self-pay | Admitting: Oncology

## 2024-06-22 NOTE — Telephone Encounter (Signed)
 Patient has been scheduled for follow-up visit per 05/03/2024 LOS.  Pt noted appt details on personal planner/calendar.

## 2024-06-28 ENCOUNTER — Telehealth: Payer: Self-pay

## 2024-06-28 NOTE — Telephone Encounter (Signed)
 Followed up with patient regarding a voicemail she left. Patient reported hving left eye twitch since this morning. Also left head pain and slight shortness of breath. Dr. Autumn made aware. Patient denied having any sinus congestion. Patient did share that she increased her caffeine  intake recently. Patient advised to decrease her caffeine  intake, which is a known irritant for her condition. She was also advised to rest her eyes. Patient understood and agreed.

## 2024-07-03 ENCOUNTER — Inpatient Hospital Stay: Admitting: Oncology

## 2024-07-03 ENCOUNTER — Telehealth: Payer: Self-pay

## 2024-07-03 ENCOUNTER — Telehealth: Payer: Self-pay | Admitting: Oncology

## 2024-07-03 ENCOUNTER — Inpatient Hospital Stay: Attending: Oncology

## 2024-07-03 NOTE — Telephone Encounter (Signed)
 Followed up with patient a second time to acknowledge her cancellation of her appointment today with Dr. Autumn and that she shared she was not feeling well and having stomach problems. The message was relayed to me in Staff Message.

## 2024-07-03 NOTE — Telephone Encounter (Signed)
 Followed up with patient as she has not yet shown up for her Oncology appointment with Dr. Autumn. Left a voicemail letting patient know someone would reach out to her to let reschedule her missed appointment.

## 2024-07-05 ENCOUNTER — Telehealth: Payer: Self-pay

## 2024-07-05 NOTE — Telephone Encounter (Signed)
 Spoke with in regards to her FMLA forms being completed. Pt verbalized understanding.Pt wanted copies of her forms to be emailed.

## 2024-07-12 ENCOUNTER — Encounter: Payer: Self-pay | Admitting: Oncology

## 2024-07-13 ENCOUNTER — Telehealth: Payer: Self-pay | Admitting: Oncology

## 2024-07-13 NOTE — Telephone Encounter (Signed)
 I spoke with patient as she called in to reschedule lab and MD for [facility] location for 08/01/2024.

## 2024-07-21 ENCOUNTER — Other Ambulatory Visit: Payer: Self-pay

## 2024-07-21 ENCOUNTER — Telehealth: Payer: Self-pay

## 2024-07-21 DIAGNOSIS — C50412 Malignant neoplasm of upper-outer quadrant of left female breast: Secondary | ICD-10-CM

## 2024-07-21 DIAGNOSIS — G620 Drug-induced polyneuropathy: Secondary | ICD-10-CM

## 2024-07-21 NOTE — Telephone Encounter (Signed)
 Followed up with patient per her MyChart message asking for referral to OB/GYN. Patient advised to call her insurance company to see who she is able to see within her network. Patient agreed. While on the phone, patient complained of feeling very tired. This is not new for this patient but she feels it is getting worse. She asked to have a B-12 shot. Dr. Autumn aware and agreed to run a B-12 level on her when she comes for her next appt on 08/01/24. Lab order placed. Patient understood and agreed to all discussed.

## 2024-08-01 ENCOUNTER — Inpatient Hospital Stay: Admitting: Oncology

## 2024-08-01 ENCOUNTER — Inpatient Hospital Stay
